# Patient Record
Sex: Male | Born: 1940 | Marital: Married | Smoking: Never smoker
Health system: Southern US, Community
[De-identification: ages and names within clinical notes are randomized; demographics above are authoritative.]

## PROBLEM LIST (undated history)

## (undated) DIAGNOSIS — K219 Gastro-esophageal reflux disease without esophagitis: Secondary | ICD-10-CM

## (undated) DIAGNOSIS — M069 Rheumatoid arthritis, unspecified: Secondary | ICD-10-CM

## (undated) DIAGNOSIS — C801 Malignant (primary) neoplasm, unspecified: Secondary | ICD-10-CM

## (undated) DIAGNOSIS — T8859XA Other complications of anesthesia, initial encounter: Secondary | ICD-10-CM

## (undated) HISTORY — DX: Rheumatoid arthritis, unspecified: M06.9

## (undated) HISTORY — PX: TRIGGER FINGER RELEASE: SHX641

## (undated) HISTORY — PX: HEMORRHOID SURGERY: SHX153

## (undated) HISTORY — DX: Gastro-esophageal reflux disease without esophagitis: K21.9

## (undated) HISTORY — PX: EYE SURGERY: SHX253

## (undated) HISTORY — PX: LYMPH NODE BIOPSY: SHX201

---

## 2014-01-11 HISTORY — PX: MOHS SURGERY: SUR867

## 2014-12-06 DIAGNOSIS — M79661 Pain in right lower leg: Secondary | ICD-10-CM | POA: Insufficient documentation

## 2015-04-20 DIAGNOSIS — H524 Presbyopia: Secondary | ICD-10-CM | POA: Insufficient documentation

## 2015-04-20 DIAGNOSIS — Z961 Presence of intraocular lens: Secondary | ICD-10-CM | POA: Insufficient documentation

## 2018-11-15 ENCOUNTER — Encounter: Payer: Self-pay | Admitting: Family Medicine

## 2018-11-15 ENCOUNTER — Ambulatory Visit: Payer: Self-pay

## 2018-11-15 ENCOUNTER — Telehealth: Payer: Self-pay

## 2018-11-15 ENCOUNTER — Ambulatory Visit (INDEPENDENT_AMBULATORY_CARE_PROVIDER_SITE_OTHER): Payer: Medicare Other | Admitting: Family Medicine

## 2018-11-15 ENCOUNTER — Other Ambulatory Visit: Payer: Self-pay

## 2018-11-15 DIAGNOSIS — M25562 Pain in left knee: Secondary | ICD-10-CM | POA: Diagnosis not present

## 2018-11-15 MED ORDER — NABUMETONE 750 MG PO TABS: 750.0000 mg | ORAL_TABLET | Freq: Two times a day (BID) | ORAL | 6 refills | Status: DC | PRN

## 2018-11-15 NOTE — Patient Instructions (Signed)
     Turmeric:  500 mg twice daily   

## 2018-11-15 NOTE — Progress Notes (Signed)
   Office Visit Note   Patient: Joshua Ruiz           Date of Birth: December 30, 1940           MRN: [id number] Visit Date: 11/15/2018 Requested by: No referring provider defined for this encounter. PCP: No primary care provider on file.  Subjective: Chief Complaint  Patient presents with  . Left Knee - Pain    Pain in medial aspect of knee x 6 months. Occasionally hurts all over, Swells. No popping/grinding. Had knee aspiration and cortisone injection 4 & 1/2 months ago by rheumatologist.    HPI: He is here with pain.  Symptoms started about 6 months ago, no injury.  He had pain and swelling and went to the rheumatologist out of town, his knee was aspirated and injected with cortisone.  The fluid did not show any abnormalities per patient report.  He got about 2 weeks of relief.  He does not have any locking or catching symptoms, it hurts only after walking for a while.  Pain does not keep him awake at night.  He is taking meloxicam with minimal improvement.  He also takes glucosamine.  Pain is primarily on the medial aspect.              ROS: No fevers or chills.  All other systems were reviewed and are negative.  Objective: Vital Signs: There were no vitals taken for this visit.  Physical Exam:  General:  Alert and oriented, in no acute distress. Pulm:  Breathing unlabored. Psy:  Normal mood, congruent affect. Skin: No rash or erythema, slight increased warmth. Left knee: Trace effusion, 1+ patellofemoral crepitus.  Full active extension and flexion of 120 degrees.  He has slight laxity with valgus stress but still a solid endpoint.  No laxity with anterior drawer.  He is tender on the medial joint line with no palpable click on McMurray's.   Imaging: X-rays left knee: Moderate medial compartment narrowing and moderate patellofemoral spurring, no obvious loose body.  Assessment & Plan: 1.  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 -Consider consider medial compartment unloading brace if symptoms persist.     Procedures: No procedures performed  No notes on file     PMFS History: There are no active problems to display for this patient.  History reviewed. No pertinent past medical history.  History reviewed. No pertinent family history.  History reviewed. No pertinent surgical history. Social History   Occupational History  . Not on file  Tobacco Use  . Smoking status: Not on file  Substance and Sexual Activity  . Alcohol use: Not on file  . Drug use: Not on file  . Sexual activity: Not on file

## 2018-11-15 NOTE — Telephone Encounter (Signed)
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

## 2018-11-20 NOTE — Telephone Encounter (Signed)
Can you please call patient and tell him the below about his cost for injection, and schedule him with Dr Junius Roads for left knee Durolane?  Thanks!  Patient will owe $90 copay on date of service.  Buy and bill ok, no PA needed.

## 2018-11-20 NOTE — Telephone Encounter (Signed)
Submitted online [id number] for Durolane.

## 2018-11-29 ENCOUNTER — Telehealth: Payer: Self-pay | Admitting: Family Medicine

## 2018-11-29 NOTE — Telephone Encounter (Signed)
I called the patient and scheduled an appointment for 12/05/18 at 10:20, $90 copay.

## 2018-11-29 NOTE — Telephone Encounter (Signed)
Pt called in checking on the status for his request for a gel injection?   [phone]

## 2018-12-05 ENCOUNTER — Other Ambulatory Visit: Payer: Self-pay

## 2018-12-05 ENCOUNTER — Encounter: Payer: Self-pay | Admitting: Family Medicine

## 2018-12-05 ENCOUNTER — Ambulatory Visit (INDEPENDENT_AMBULATORY_CARE_PROVIDER_SITE_OTHER): Payer: Medicare Other | Admitting: Family Medicine

## 2018-12-05 DIAGNOSIS — M1712 Unilateral primary osteoarthritis, left knee: Secondary | ICD-10-CM

## 2018-12-05 NOTE — Progress Notes (Signed)
   Office Visit Note   Patient: Joshua Ruiz           Date of Birth: 10/19/1940           MRN: [id number] Visit Date: 12/05/2018 Requested by: No referring provider defined for this encounter. PCP: No primary care provider on file.  Subjective: Chief Complaint  Patient presents with  . Left Knee - Pain    Durolane injection    HPI: He's here for left knee Durolane injection.                ROS: No fevers/chills.  All other systems were reviewed and are negative.  Objective: Vital Signs: There were no vitals taken for this visit.  Physical Exam:  General:  Alert and oriented, in no acute distress. Pulm:  Breathing unlabored. Psy:  Normal mood, congruent affect. Skin:  No erythema  Left knee:  1-2+ effusion with no warmth.  Imaging: None  Assessment & Plan: 1.  Left knee DJD - Durolane today.  Return as needed.     Procedures: Left knee Durolane injection: After sterile prep with Betadine, injected 3 cc 1% lidocaine without epinephrine, then aspirated 20 cc clear yellow synovial fluid, then injected duralane from superolateral approach.    PMFS History: Patient Active Problem List   Diagnosis Date Noted  . Presbyopia of both eyes 04/20/2015  . 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 Right calf pain 12/06/2014   History reviewed. No pertinent past medical history.  History reviewed. No pertinent family history.  History reviewed. No pertinent surgical history. Social History   Occupational History  . Not on file  Tobacco Use  . Smoking status: Not on file  Substance and Sexual Activity  . Alcohol use: Not on file  . Drug use: Not on file  . Sexual activity: Not on file

## 2019-01-12 HISTORY — PX: COLONOSCOPY: SHX174

## 2019-02-25 ENCOUNTER — Ambulatory Visit: Payer: Medicare PPO | Attending: Internal Medicine

## 2019-02-25 DIAGNOSIS — Z23 Encounter for immunization: Secondary | ICD-10-CM | POA: Insufficient documentation

## 2019-02-25 NOTE — Progress Notes (Signed)
   Covid-19 Vaccination Clinic  Name:  Joshua Ruiz    MRN: [id number] DOB: 1940/12/08  02/25/2019  [name] was observed post Covid-19 immunization for 15 minutes without incidence. He was provided with Vaccine Information Sheet and instruction to access the V-Safe system.   [name] was instructed to call 911 with any severe reactions post vaccine: Marland Kitchen Difficulty breathing  . Swelling of your face and throat  . A fast heartbeat  . A bad rash all over your body  . Dizziness and weakness    Immunizations Administered    Name Date Dose VIS Date Route   Pfizer COVID-19 Vaccine 02/25/2019  2:23 PM 0.3 mL 12/22/2018 Intramuscular   Manufacturer: [employer], [employer]   Lot: [id number]   NDC: [address]-0

## 2019-03-20 ENCOUNTER — Ambulatory Visit: Payer: Medicare PPO | Attending: Internal Medicine

## 2019-03-20 DIAGNOSIS — Z23 Encounter for immunization: Secondary | ICD-10-CM

## 2019-03-20 NOTE — Progress Notes (Signed)
   Covid-19 Vaccination Clinic  Name:  Joshua Ruiz    MRN: [id number] DOB: [birth year]  03/20/2019  [name] was observed post Covid-19 immunization for 15 minutes without incident. He was provided with Vaccine Information Sheet and instruction to access the V-Safe system.   [name] was instructed to call 911 with any severe reactions post vaccine: Marland Kitchen Difficulty breathing  . Swelling of face and throat  . A fast heartbeat  . A bad rash all over body  . Dizziness and weakness   Immunizations Administered    Name Date Dose VIS Date Route   Pfizer COVID-19 Vaccine 03/20/2019  2:01 PM 0.3 mL 12/22/2018 Intramuscular   Manufacturer: [employer], [employer]   Lot: [id number]   NDC: [address]-9

## 2019-03-21 ENCOUNTER — Ambulatory Visit: Payer: Medicare PPO

## 2019-05-23 ENCOUNTER — Telehealth: Payer: Self-pay | Admitting: Family Medicine

## 2019-05-23 MED ORDER — NABUMETONE 750 MG PO TABS: 750.0000 mg | ORAL_TABLET | Freq: Two times a day (BID) | ORAL | 6 refills | Status: DC | PRN

## 2019-05-23 NOTE — Telephone Encounter (Signed)
Sent!

## 2019-05-23 NOTE — Telephone Encounter (Signed)
I called and advised the patient the nabumetone has been sent in to his pharmacy.

## 2019-05-23 NOTE — Telephone Encounter (Signed)
Patient called requesting a refill of medication mabumetrone. Please send to pharmacy on file. Patient phone number is [phone].

## 2019-05-23 NOTE — Telephone Encounter (Signed)
Please advise 

## 2019-06-25 ENCOUNTER — Ambulatory Visit: Payer: Medicare PPO | Admitting: Podiatry

## 2019-06-25 ENCOUNTER — Encounter: Payer: Self-pay | Admitting: Podiatry

## 2019-06-25 ENCOUNTER — Other Ambulatory Visit: Payer: Self-pay

## 2019-06-25 DIAGNOSIS — B351 Tinea unguium: Secondary | ICD-10-CM

## 2019-06-25 DIAGNOSIS — L603 Nail dystrophy: Secondary | ICD-10-CM

## 2019-06-25 NOTE — Progress Notes (Signed)
Subjective:   Patient ID: Joshua Ruiz, male   DOB: 79 y.o.   MRN: [id number]   HPI 79 year old male presents the office for concerns of his left big toe nail.  60% infection was treated himself and the infection resolved and the nail did come off.  He wants to have the nail checked to make sure that looks okay to make sure the nails can grow back in.  Continue surgical shoe boot but he currently denies any redness or drainage or any swelling and no pain.   Review of Systems  All other systems reviewed and are negative.  History reviewed. No pertinent past medical history.  History reviewed. No pertinent surgical history.   Current Outpatient Medications:    cetirizine (ZYRTEC) 10 MG tablet, Take by mouth., Disp: , Rfl:    folic acid (FOLVITE) 1 MG tablet, Take by mouth., Disp: , Rfl:    Glucosamine-Chondroitin 500-400 MG CAPS, Take by mouth., Disp: , Rfl:    hydroxychloroquine (PLAQUENIL) 200 MG tablet, Take by mouth., Disp: , Rfl:    loratadine (CLARITIN) 10 MG tablet, Take by mouth., Disp: , Rfl:    methotrexate (RHEUMATREX) 2.5 MG tablet, Take 2.5 mg by mouth once a week. Caution:Chemotherapy. Protect from light., Disp: , Rfl:    Multiple Vitamin (MULTI-VITAMIN) tablet, Take by mouth., Disp: , Rfl:    nabumetone (RELAFEN) 750 MG tablet, Take 1 tablet (750 mg total) by mouth 2 (two) times daily as needed., Disp: 60 tablet, Rfl: 6   omeprazole (PRILOSEC) 20 MG capsule, Take by mouth., Disp: , Rfl:   No Known Allergies        Objective:  Physical Exam  General: AAO x3, NAD  Dermatological: Bilateral hallux nails are hypertrophic, dystrophic with yellow-brown discoloration except on the left side only half the nails present Second new nail started to grow back again.  There is no drainage or pus or ascending cellulitis.  There is no fluctuation.  There is no tenderness palpation of nail.  No obvious signs of infection.  Vascular: Dorsalis Pedis artery and  Posterior Tibial artery pedal pulses are 2/4 bilateral with immedate capillary fill time.There is no pain with calf compression, swelling, warmth, erythema.   Neruologic: Grossly intact via light touch bilateral.  Musculoskeletal: No gross boney pedal deformities bilateral. No pain, crepitus, or limitation noted with foot and ankle range of motion bilateral. Muscular strength 5/5 in all groups tested bilateral.  Gait: Unassisted, Nonantalgic.       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  Discussed this likely the nail will grow out but it is difficult to determine if it will do so completely. Unfortunately the nails are very damage to the bilateral hallux nails.  Discussed treatment options for this.  For now would recommend Epson salt soaks daily metabolic ointment monitor closely for signs or symptoms of recurrent infection.    Vivi Barrack DPM

## 2019-06-25 NOTE — Patient Instructions (Signed)
You can apply antifungal medication to the toenail. I like an over the counter medication called "fungi-nail".  If you develop any signs of infection, please let me know .

## 2019-12-14 ENCOUNTER — Other Ambulatory Visit: Payer: Self-pay | Admitting: Family Medicine

## 2020-06-20 ENCOUNTER — Ambulatory Visit: Payer: Medicare PPO | Admitting: Family Medicine

## 2020-07-01 ENCOUNTER — Ambulatory Visit: Payer: Medicare PPO | Attending: Rheumatology

## 2020-07-01 ENCOUNTER — Other Ambulatory Visit: Payer: Self-pay

## 2020-07-01 DIAGNOSIS — R293 Abnormal posture: Secondary | ICD-10-CM | POA: Insufficient documentation

## 2020-07-01 DIAGNOSIS — M542 Cervicalgia: Secondary | ICD-10-CM | POA: Diagnosis present

## 2020-07-01 NOTE — Therapy (Signed)
[facility] [address] [place], [place], [address] Phone: [phone]   Fax:  [phone]  Physical Therapy Evaluation  Patient Details  Name: Joshua Ruiz MRN: [id number] Date of Birth: 11/21/1940 Referring Provider (PT): Areatha Keas, MD   Encounter Date: 07/01/2020   PT End of Session - 07/01/20 1637     Visit Number 1    Number of Visits 12    Date for PT Re-Evaluation 08/26/20    Authorization Type [employer]    Progress Note Due on Visit 10    PT Start Time 1533    PT Stop Time 1620    PT Time Calculation (min) 47 min    Activity Tolerance Patient tolerated treatment well    Behavior During Therapy [facility] for tasks assessed/performed             No past medical history on file.  No past surgical history on file.  There were no vitals filed for this visit.    Subjective Assessment - 07/01/20 1540     Subjective Pt reports neck pain for the last 1-1.5 years that has gotten worse. 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 He began taking Methotrexate 5-6 years ago. He has limitations with driving due to difficulty turning head R>L and with prolonged looking down, as well as AM pain and pain upon lying in bed at night.    Patient Stated Goals "to get neck loosened up so it doesn't hurt much, and I can turn it better"    Currently in Pain? No/denies    Pain Score --   5-6/10   Pain Location Neck    Pain Orientation Right;Left    Pain Descriptors / Indicators Aching    Pain Type Chronic pain    Pain Onset More than a month ago    Pain Frequency Intermittent    Aggravating Factors  turning head when driving (has to turn body) R>L, nighttime pain when he lays down, looking down to look at phone for awhile, AM stiffness    Pain Relieving Factors laying down and turning head side to side in the morning                [facility] PT Assessment - 07/01/20 0001       Assessment    Medical Diagnosis Cervicalgia    Referring Provider (PT) Areatha Keas, MD    Onset Date/Surgical Date --   at least a year   Hand Dominance Right    Next MD Visit 3 months    Prior Therapy several years ago for R shoulder      Precautions   Precautions None      Restrictions   Weight Bearing Restrictions No      Balance Screen   Has the patient fallen in the past 6 months No    Has the patient had a decrease in activity level because of a fear of falling?  No    Is the patient reluctant to leave their home because of a fear of falling?  No      Posture/Postural Control   Posture/Postural Control Postural limitations    Postural Limitations Forward head;Rounded Shoulders    Posture Comments increase upper thoracic kyphosis      ROM / Strength   AROM / PROM / Strength AROM;Strength      AROM   AROM Assessment Site Cervical;Shoulder    Right/Left Shoulder Right;Left  Right Shoulder Flexion 120 Degrees    Right Shoulder ABduction 140 Degrees    Right Shoulder Internal Rotation --   L3   Right Shoulder External Rotation --   T2   Left Shoulder Flexion 138 Degrees    Left Shoulder ABduction 140 Degrees    Left Shoulder Internal Rotation --   T12   Left Shoulder External Rotation --   T2   Cervical Flexion 28    Cervical Extension 22    Cervical - Right Side Bend 8    Cervical - Left Side Bend 10    Cervical - Right Rotation 42    Cervical - Left Rotation 48      Strength   Strength Assessment Site Shoulder    Right/Left Shoulder Right;Left    Right Shoulder Flexion 5/5    Right Shoulder ABduction 5/5    Right Shoulder External Rotation 5/5    Right Shoulder Horizontal ADduction 5/5    Left Shoulder Flexion 5/5    Left Shoulder ABduction 5/5    Left Shoulder Internal Rotation 5/5    Left Shoulder External Rotation 5/5                        Objective measurements completed on examination: See above findings.               PT Education  - 07/01/20 1636     Education Details Diagnosis, Prognosis, HEP, POC    Person(s) Educated Patient    Methods Explanation;Demonstration;Tactile cues;Verbal cues;Handout    Comprehension Verbalized understanding;Returned demonstration;Verbal cues required;Tactile cues required              PT Short Term Goals - 07/01/20 1656       PT SHORT TERM GOAL #1   Title Pt will be I and compliant with initial HEP.    Baseline prescribed at eval    Time 3    Period Weeks    Status New    Target Date 07/22/20      PT SHORT TERM GOAL #2   Title FOTO will be taken with LTG created by visit 3.    Baseline not taken    Time 3    Period Weeks    Status New    Target Date 07/22/20               PT Long Term Goals - 07/01/20 1656       PT LONG TERM GOAL #1   Title Pt will be I with advanced HEP for maintenance of symptoms.    Time 8    Period Weeks    Status New    Target Date 08/26/20      PT LONG TERM GOAL #2   Title Pt will increase B cervical rotation to >/= 60, in order to easily turn head to check blindspots for safety in driving.    Baseline see flowsheet    Time 8    Period Weeks    Status New    Target Date 08/26/20      PT LONG TERM GOAL #3   Title Pt will decrease max pain level to </= 3/10 with all I/ADLs.    Baseline 5-6/10    Time 8    Period Weeks    Status New    Target Date 08/26/20                    Plan -  07/01/20 1638     Clinical Impression Statement Pt is an 80 yo male who presents to OP PT with neck pain that began 1-1.5 years ago, likely Rheumatoid arthritis progression. He c/o difficulty turning his head causing him to turn his body to check blindspots, pain with prolonged looking down (i.e. at phone), AM stiffness for 1st hour, and pain with lying down at nighttime. He demonstrates impairments in posture: forward head, increased upper thoracic kyphosis, forward rounding of shoulders; decreased neck ROM all planes; hypmobility of  cervical joints; and decreased B shoulder ROM R>L. Pt was educated on posture, diagnosis, prognosis, HEP, and POC verbalizing understanding and consent to tx. He could benefit from skilled PT 1-2x/week for 8 weeks to address neck pain and stiffness for improved ability to turn his head to safely drive and participate in IADLs.    Personal Factors and Comorbidities Comorbidity 1;Age;Past/Current Experience;Time since onset of injury/illness/exacerbation    Comorbidities RA    Examination-Activity Limitations Reach Overhead;Lift;Sleep    Examination-Participation Restrictions Driving    Rehab Potential Good    PT Frequency --   1-2x/week   PT Duration 8 weeks    PT Treatment/Interventions Spinal Manipulations;Joint Manipulations;ADLs/Self Care Home Management;Cryotherapy;Electrical Stimulation;Iontophoresis 4mg /ml Dexamethasone;Moist Heat;Traction;Therapeutic activities;Therapeutic exercise;Neuromuscular re-education;Patient/family education;Manual techniques;Passive range of motion;Dry needling;Taping    PT Next Visit Plan FOTO!! Assess response to HEP/update PRN, progress cervical/spinal mobility, pec flexibility, DNF and periscapular strengthening    PT Home Exercise Plan [email or web address]    Consulted and Agree with Plan of Care Patient             Patient will benefit from skilled therapeutic intervention in order to improve the following deficits and impairments:  Pain, Postural dysfunction, Impaired flexibility, Increased fascial restricitons, Improper body mechanics, Impaired perceived functional ability, Decreased range of motion, Hypomobility  Visit Diagnosis: Cervicalgia  Abnormal posture     Problem List Patient Active Problem List   Diagnosis Date Noted   Presbyopia of both eyes 04/20/2015   Pseudophakia of both eyes 04/20/2015   Right calf pain 12/06/2014    Marcelline Mates, PT, DPT 07/01/2020, 5:01 PM  [facility] [address] [place], [place], [address] Phone: [phone]   Fax:  [phone]  Name: Ananth Maisonet MRN: [id number] Date of Birth: [birth year]

## 2020-07-01 NOTE — Patient Instructions (Signed)
Access Code: [email or web address] URL: [email or web address] Date: 07/01/2020 Prepared by: Gardiner Rhyme  Exercises Open Books - 2 x daily - 7 x weekly - 2 sets - 10 reps Thoracic Extension Mobilization on Half Foam Roll - 2 x daily - 7 x weekly - 2 sets - 10 reps Hooklying Cervical Rotations on Half Foam Roll - 1-2 x daily - 7 x weekly - 1-2 sets - 10 reps Seated Assisted Cervical Rotation with Towel - 2-3 x daily - 7 x weekly - 1-2 sets - 10 reps Seated Cervical Traction - 2-3 x daily - 7 x weekly - 5-10 sets - 5-10 seconds hold

## 2020-07-03 ENCOUNTER — Other Ambulatory Visit: Payer: Self-pay

## 2020-07-03 ENCOUNTER — Ambulatory Visit: Payer: Medicare PPO

## 2020-07-03 DIAGNOSIS — M542 Cervicalgia: Secondary | ICD-10-CM

## 2020-07-03 DIAGNOSIS — R293 Abnormal posture: Secondary | ICD-10-CM

## 2020-07-03 NOTE — Therapy (Signed)
[facility] [address]. [place], [place], [address] Phone: [phone]   Fax:  [phone]  Physical Therapy Treatment  Patient Details  Name: Joshua Ruiz MRN: [id number] Date of Birth: 03/31/1940 Referring Provider (PT): Areatha Keas, MD   Encounter Date: 07/03/2020   PT End of Session - 07/03/20 1053     Visit Number 2    Number of Visits 12    Date for PT Re-Evaluation 08/26/20    Authorization Type [employer]    Progress Note Due on Visit 10    PT Start Time 1041    PT Stop Time 1125    PT Time Calculation (min) 44 min    Activity Tolerance Patient tolerated treatment well    Behavior During Therapy [facility] for tasks assessed/performed             History reviewed. No pertinent past medical history.  History reviewed. No pertinent surgical history.  There were no vitals filed for this visit.   Subjective Assessment - 07/03/20 1052     Subjective Doing okay, tried some of the exercises, feel like they help sme    Patient Stated Goals "to get neck loosened up so it doesn't hurt much, and I can turn it better"    Currently in Pain? Yes    Pain Score 1     Pain Location Neck    Pain Orientation Right;Left    Pain Onset More than a month ago                [facility] PT Assessment - 07/03/20 0001       Observation/Other Assessments   Focus on Therapeutic Outcomes (FOTO)  45%                           [facility] Adult PT Treatment/Exercise - 07/03/20 0001       Exercises   Exercises Neck;Shoulder      Neck Exercises: Seated   Other Seated Exercise 5" x 10 cervical traction with towel. 5" x 10 cervical rotaiton with towel bilateral.      Neck Exercises: Supine   Other Supine Exercise hooklying cervical rotation on vertical foam roll with arms out to sides x 10 L/R. thoracic mobilization on foam roll x10 hookyling. Open book stretch 5" x 10 bilateral.    Other Supine Exercise chin  tucks 5" x 10. chin tuck with head lift 5" x5. Yellow TB horizontal ABD in supine x 10      Shoulder Exercises: Seated   Extension Strengthening;Both;10 reps   2 sets, red   Row Strengthening;Both;10 reps   2 sets, red                     PT Short Term Goals - 07/03/20 1056       PT SHORT TERM GOAL #1   Title Pt will be I and compliant with initial HEP.    Baseline prescribed at eval    Time 3    Period Weeks    Status On-going    Target Date 07/22/20      PT SHORT TERM GOAL #2   Title FOTO will be taken with LTG created by visit 3.    Baseline 45% taken visit 2    Time 3    Period Weeks    Status Achieved    Target Date 07/22/20  PT Long Term Goals - 07/03/20 1057       PT LONG TERM GOAL #1   Title Pt will be I with advanced HEP for maintenance of symptoms.    Time 8    Period Weeks    Status On-going      PT LONG TERM GOAL #2   Title Pt will increase B cervical rotation to >/= 60, in order to easily turn head to check blindspots for safety in driving.    Baseline see flowsheet    Time 8    Period Weeks    Status On-going      PT LONG TERM GOAL #3   Title Pt will decrease max pain level to </= 3/10 with all I/ADLs.    Baseline 5-6/10    Time 8    Period Weeks    Status On-going      PT LONG TERM GOAL #4   Title FOTO increased to at least 58% to dmeo improved neck function    Status On-going                   Plan - 07/03/20 1054     Clinical Impression Statement Pt tolerated all interventions well. Reviewed initial HEP with some cues required to prevent trunk compensations with seated cervical stretches and cues for hand positioning for towel stretches. Completed STG of attaining FOTO this visit where he scored at 45% for neck function at this time.  Added banded rows, extensions to HEP for postural strengthening. Plan to continue per POC and progress mobility, strength as tolerated.    Personal Factors and  Comorbidities Comorbidity 1;Age;Past/Current Experience;Time since onset of injury/illness/exacerbation    Comorbidities RA    Examination-Activity Limitations Reach Overhead;Lift;Sleep    Examination-Participation Restrictions Driving    Rehab Potential Good    PT Frequency --   1-2x/week   PT Duration 8 weeks    PT Treatment/Interventions Spinal Manipulations;Joint Manipulations;ADLs/Self Care Home Management;Cryotherapy;Electrical Stimulation;Iontophoresis 4mg /ml Dexamethasone;Moist Heat;Traction;Therapeutic activities;Therapeutic exercise;Neuromuscular re-education;Patient/family education;Manual techniques;Passive range of motion;Dry needling;Taping    PT Next Visit Plan Assess response to HEP/update PRN, progress cervical/spinal mobility, pec flexibility, DNF and periscapular strengthening    PT Home Exercise Plan [id number] - reinforced compliance with HEP for home carryover    Consulted and Agree with Plan of Care Patient             Patient will benefit from skilled therapeutic intervention in order to improve the following deficits and impairments:  Pain, Postural dysfunction, Impaired flexibility, Increased fascial restricitons, Improper body mechanics, Impaired perceived functional ability, Decreased range of motion, Hypomobility  Visit Diagnosis: Cervicalgia  Abnormal posture     Problem List Patient Active Problem List   Diagnosis Date Noted   Presbyopia of both eyes 04/20/2015   Pseudophakia of both eyes 04/20/2015   Right calf pain 12/06/2014    12/08/2014, PT, DPT 07/03/2020, 11:28 AM  [facility] [place] [address] [place], [place], [place] Phone: [phone]   Fax:  [phone]  Name: Joshua Ruiz MRN: Gwynne Edinger Date of Birth: May 08, 1940

## 2020-07-08 ENCOUNTER — Encounter: Payer: Self-pay | Admitting: Physical Therapy

## 2020-07-08 ENCOUNTER — Ambulatory Visit: Payer: Medicare PPO | Admitting: Physical Therapy

## 2020-07-08 ENCOUNTER — Other Ambulatory Visit: Payer: Self-pay

## 2020-07-08 DIAGNOSIS — R293 Abnormal posture: Secondary | ICD-10-CM

## 2020-07-08 DIAGNOSIS — M542 Cervicalgia: Secondary | ICD-10-CM

## 2020-07-08 NOTE — Therapy (Signed)
[facility] [place] [facility] [address] [place], [place], [address] Phone: [phone]   Fax:  [phone]  Physical Therapy Treatment  Patient Details  Name: Joshua Ruiz MRN: [id number] Date of Birth: [birth year] Referring Provider (PT): Areatha Keas, MD   Encounter Date: 07/08/2020   PT End of Session - 07/08/20 0916     Visit Number 3    Number of Visits 12    Date for PT Re-Evaluation 08/26/20    Authorization Type [employer]    Progress Note Due on Visit 10    PT Start Time 0847    PT Stop Time 0925    PT Time Calculation (min) 38 min    Activity Tolerance Patient tolerated treatment well    Behavior During Therapy [facility] for tasks assessed/performed             History reviewed. No pertinent past medical history.  History reviewed. No pertinent surgical history.  There were no vitals filed for this visit.   Subjective Assessment - 07/08/20 0841     Subjective Pt is doing okay today. Pt reports he has been doing those exercises at home.    Currently in Pain? Yes    Pain Score 1     Pain Location Neck                               [facility] Adult PT Treatment/Exercise - 07/08/20 0001       Neck Exercises: Machines for Strengthening   UBE (Upper Arm Bike) L1 2.5 fwd/2.5 bwd      Neck Exercises: Standing   Other Standing Exercises shrugs 2x10 3#      Neck Exercises: Seated   Other Seated Exercise 2x30 sec upper trap, levator stretch    Other Seated Exercise chin tucks 2x10, cervicle rotation with towel 2x30 sec      Shoulder Exercises: Seated   Extension Strengthening;Both;10 reps   2 sets, red   Row Strengthening;Both;10 reps   2 sets, red     Shoulder Exercises: Standing   Horizontal ABduction Both;20 reps;Theraband    Theraband Level (Shoulder Horizontal ABduction) Level 1 (Yellow)    Flexion Both;20 reps;Weights   2#   ABduction Both;20 reps;Weights   2#     Shoulder Exercises:  Stretch   Corner Stretch 2 reps;30 seconds                      PT Short Term Goals - 07/03/20 1056       PT SHORT TERM GOAL #1   Title Pt will be I and compliant with initial HEP.    Baseline prescribed at eval    Time 3    Period Weeks    Status On-going    Target Date 07/22/20      PT SHORT TERM GOAL #2   Title FOTO will be taken with LTG created by visit 3.    Baseline 45% taken visit 2    Time 3    Period Weeks    Status Achieved    Target Date 07/22/20               PT Long Term Goals - 07/03/20 1057       PT LONG TERM GOAL #1   Title Pt will be I with advanced HEP for maintenance of symptoms.    Time 8    Period Weeks  Status On-going      PT LONG TERM GOAL #2   Title Pt will increase B cervical rotation to >/= 60, in order to easily turn head to check blindspots for safety in driving.    Baseline see flowsheet    Time 8    Period Weeks    Status On-going      PT LONG TERM GOAL #3   Title Pt will decrease max pain level to </= 3/10 with all I/ADLs.    Baseline 5-6/10    Time 8    Period Weeks    Status On-going      PT LONG TERM GOAL #4   Title FOTO increased to at least 58% to dmeo improved neck function    Status On-going                   Plan - 07/08/20 9826     Clinical Impression Statement Pt tolerated all exercises well. He required verbal and tactile cueing for posture throughtout all exercises for decrease kyphotic posture and forward head. Pt reports a decrease in pain following treatment.    PT Treatment/Interventions Spinal Manipulations;Joint Manipulations;ADLs/Self Care Home Management;Cryotherapy;Electrical Stimulation;Iontophoresis 4mg /ml Dexamethasone;Moist Heat;Traction;Therapeutic activities;Therapeutic exercise;Neuromuscular re-education;Patient/family education;Manual techniques;Passive range of motion;Dry needling;Taping    PT Next Visit Plan Assess response to HEP/update PRN, progress cervical/spinal  mobility, pec flexibility, DNF and periscapular strengthening    PT Home Exercise Plan [id number] - reinforced compliance with HEP for home carryover             Patient will benefit from skilled therapeutic intervention in order to improve the following deficits and impairments:  Pain, Postural dysfunction, Impaired flexibility, Increased fascial restricitons, Improper body mechanics, Impaired perceived functional ability, Decreased range of motion, Hypomobility  Visit Diagnosis: Cervicalgia  Abnormal posture     Problem List Patient Active Problem List   Diagnosis Date Noted   Presbyopia of both eyes 04/20/2015   Pseudophakia of both eyes 04/20/2015   Right calf pain 12/06/2014    12/08/2014, SPTA 07/08/2020, 9:27 AM  [facility] [place] [facility] [address] [place], [place], [place] Phone: [phone]   Fax:  [phone]  Name: Joshua Ruiz MRN: Gwynne Edinger Date of Birth: 1940/03/06

## 2020-07-10 ENCOUNTER — Other Ambulatory Visit: Payer: Self-pay

## 2020-07-10 ENCOUNTER — Ambulatory Visit: Payer: Medicare PPO | Admitting: Physical Therapy

## 2020-07-10 DIAGNOSIS — M542 Cervicalgia: Secondary | ICD-10-CM | POA: Diagnosis not present

## 2020-07-10 DIAGNOSIS — R293 Abnormal posture: Secondary | ICD-10-CM

## 2020-07-10 NOTE — Therapy (Signed)
[facility] [place] [facility] [address] [place], [place], [address] Phone: [phone]   Fax:  [phone]  Physical Therapy Treatment  Patient Details  Name: Joshua Ruiz MRN: [id number] Date of Birth: 10-27-1940 Referring Provider (PT): Areatha Keas, MD   Encounter Date: 07/10/2020   PT End of Session - 07/10/20 1441     Visit Number 4    Number of Visits 12    Date for PT Re-Evaluation 08/26/20    Authorization Type [employer] Medicare    Progress Note Due on Visit 10    PT Start Time 1355    PT Stop Time 1441    PT Time Calculation (min) 46 min    Activity Tolerance Patient tolerated treatment well    Behavior During Therapy [facility] for tasks assessed/performed             No past medical history on file.  No past surgical history on file.  There were no vitals filed for this visit.   Subjective Assessment - 07/10/20 1425     Subjective Neck is just real stiff    Currently in Pain? No/denies                               [facility] Adult PT Treatment/Exercise - 07/10/20 0001       Neck Exercises: Theraband   Shoulder External Rotation 20 reps;Red      Neck Exercises: Standing   Neck Retraction 15 reps    Other Standing Exercises weighted ball overhead reach with some PT over pressure, W backs with some PT over pressure, shrugs 5# with some upper trap and levator stretches      Neck Exercises: Supine   Other Supine Exercise chin tucks 5" x 10. chin tuck with head lift 5" x5. Yellow TB horizontal ABD in supine x 10      Manual Therapy   Manual Therapy Passive ROM;Manual Traction;Soft tissue mobilization    Soft tissue mobilization to cervical parapsinals    Passive ROM to the cervical spine in supine and sitting, some AAROM to end range    Manual Traction occipital release                      PT Short Term Goals - 07/03/20 1056       PT SHORT TERM GOAL #1   Title Pt will be I and  compliant with initial HEP.    Baseline prescribed at eval    Time 3    Period Weeks    Status On-going    Target Date 07/22/20      PT SHORT TERM GOAL #2   Title FOTO will be taken with LTG created by visit 3.    Baseline 45% taken visit 2    Time 3    Period Weeks    Status Achieved    Target Date 07/22/20               PT Long Term Goals - 07/10/20 1442       PT LONG TERM GOAL #1   Title Pt will be I with advanced HEP for maintenance of symptoms.    Status On-going                   Plan - 07/10/20 1441     Clinical Impression Statement I focused a lot on cervical ROM trying to get  rotation and some side bending, he is very very stiff, he does guard some and needs cue s to relax, needs a lot of cues for the exercises to do correct form    PT Next Visit Plan see how the manual therapy did    Consulted and Agree with Plan of Care Patient             Patient will benefit from skilled therapeutic intervention in order to improve the following deficits and impairments:  Pain, Postural dysfunction, Impaired flexibility, Increased fascial restricitons, Improper body mechanics, Impaired perceived functional ability, Decreased range of motion, Hypomobility  Visit Diagnosis: Cervicalgia  Abnormal posture     Problem List Patient Active Problem List   Diagnosis Date Noted   Presbyopia of both eyes 04/20/2015   Pseudophakia of both eyes 04/20/2015   Right calf pain 12/06/2014    Jearld Lesch., PT 07/10/2020, 2:43 PM  [facility] [place] [facility] [address] [place], [place], [address] Phone: [phone]   Fax:  [phone]  Name: Joshua Ruiz MRN: [id number] Date of Birth: 01-10-1941

## 2020-07-11 ENCOUNTER — Encounter: Payer: Self-pay | Admitting: Family Medicine

## 2020-07-11 ENCOUNTER — Ambulatory Visit: Payer: Medicare PPO | Admitting: Family Medicine

## 2020-07-11 ENCOUNTER — Telehealth: Payer: Self-pay

## 2020-07-11 VITALS — BP 142/78 | HR 69 | Temp 98.3°F | Ht 68.0 in | Wt 194.4 lb

## 2020-07-11 DIAGNOSIS — K219 Gastro-esophageal reflux disease without esophagitis: Secondary | ICD-10-CM | POA: Diagnosis not present

## 2020-07-11 DIAGNOSIS — L509 Urticaria, unspecified: Secondary | ICD-10-CM | POA: Diagnosis not present

## 2020-07-11 DIAGNOSIS — M069 Rheumatoid arthritis, unspecified: Secondary | ICD-10-CM | POA: Diagnosis not present

## 2020-07-11 DIAGNOSIS — N50811 Right testicular pain: Secondary | ICD-10-CM

## 2020-07-11 DIAGNOSIS — M47812 Spondylosis without myelopathy or radiculopathy, cervical region: Secondary | ICD-10-CM | POA: Insufficient documentation

## 2020-07-11 DIAGNOSIS — M1712 Unilateral primary osteoarthritis, left knee: Secondary | ICD-10-CM | POA: Insufficient documentation

## 2020-07-11 MED ORDER — LEVOFLOXACIN 500 MG PO TABS: 500.0000 mg | ORAL_TABLET | Freq: Every day | ORAL | 0 refills | Status: AC

## 2020-07-11 NOTE — Progress Notes (Signed)
[facility] [address] [place] [address] Dept: [phone] Dept Fax: [phone]  New Patient Office Visit  Subjective:    Patient ID: Joshua Ruiz, male    DOB: 05/20/1940, 80 y.o..   MRN: [id number]  Chief Complaint  Patient presents with   Establish Care    Right testicle discomfort    History of Present Illness:  Patient is in today to establish care. Mr. Reihl is originally from [place], [place]. He has lived in [place] for much of his life. He is retired, having worked for the [employer]. 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 He is married and has 1 son (85). Mr. Angert denies tobacco or drug use. He admits to rare use of alcohol.  Mr. Broner has a history of rheumatoid arthritis. He is followed by Dr. Baldwin Jamaica with the [facility]. He is treated with methotrexate 14.5 mg weekly and hydroxychloroquine 200 mg daily. He also take folate 1 mg daily. He has not seen an eye doctor since last Sept. and has an appointment scheduled. He notes that his joints are doing okay at this point. He has had some neck stiffness and notes Dr. 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 He has been engaged in physical therapy for this. Mr. 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 He sees Dr. Prince Rome (sports medicine) for this and has received a prior gel injection in the knee.  Mr. Kloss has a history fo GERD, well managed with Prilosec.  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 He takes daily Zyrtec and notes this mostly controlls his symptoms.  Mr. Isadore notes a history of right testicular pain over the past 2 months. He has not noted the testicle to be swollen. 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 He also notes he was treated twice in the past for an  infection related to his testicle.  Past Medical History: Patient Active Problem List   Diagnosis Date Noted   Hives 07/11/2020   Rheumatoid arthritis (HCC) 07/11/2020   Osteoarthritis of left knee 07/11/2020   Cervical arthritis 07/11/2020   Gastroesophageal reflux disease 07/11/2020   Presbyopia of both eyes 04/20/2015   Pseudophakia of both eyes 04/20/2015   Past Surgical History:  Procedure Laterality Date   HEMORRHOID SURGERY     LYMPH NODE BIOPSY     Neck   TRIGGER FINGER RELEASE Right    3rd   Family History  Problem Relation Age of Onset   Rheum arthritis Mother    Diabetes Mother    Cancer Sister        Lung   Cancer Brother        Lung   Outpatient Medications Prior to Visit  Medication Sig Dispense Refill   cetirizine (ZYRTEC) 10 MG tablet Take by mouth.     folic acid (FOLVITE) 1 MG tablet Take by mouth.     hydroxychloroquine (PLAQUENIL) 200 MG tablet Take by mouth.     loratadine (CLARITIN) 10 MG tablet Take by mouth.     methotrexate (RHEUMATREX) 2.5 MG tablet Take 2.5 mg by mouth once a week. Caution:Chemotherapy. Protect from light.     Multiple Vitamin (MULTI-VITAMIN) tablet Take by mouth.     omeprazole (PRILOSEC) 20 MG capsule Take by mouth.     Glucosamine-Chondroitin  500-400 MG CAPS Take by mouth. (Patient not taking: Reported on 07/11/2020)     nabumetone (RELAFEN) 750 MG tablet TAKE 1 TABLET (750 MG TOTAL) BY MOUTH 2 (TWO) TIMES DAILY AS NEEDED. (Patient not taking: Reported on 07/11/2020) 60 tablet 6   No facility-administered medications prior to visit.   No Known Allergies    Objective:   Today's Vitals   07/11/20 1353  BP: (!) 142/78  Pulse: 69  Temp: 98.3 F (36.8 C)  SpO2: 98%  Weight: 194 lb 6.4 oz (88.2 kg)  Height: 5\' 8"  (1.727 m)   Body mass index is 29.56 kg/m.   General: Well developed, well nourished. No acute distress. GU: Normal external genitalia. The testes are descended. Both are soft. The epididymis is more     prominent on the right. There is no obvious varicocele. No sign of hernia. Extremities: No sing of joint inflammation related to any of the major joints. Psych: Alert and oriented. Normal mood and affect.  Health Maintenance Due  Topic Date Due   TETANUS/TDAP  Never done   Zoster Vaccines- Shingrix (1 of 2) Never done   PNA vac Low Risk Adult (1 of 2 - PCV13) Never done   COVID-19 Vaccine (3 - Pfizer risk series) 04/17/2019     Assessment & Plan:   1. Hives Continue daily Zyrtec.  2. Rheumatoid arthritis, involving unspecified site, unspecified whether rheumatoid factor present (HCC) Continue MTX, Plaquenil, and folate. I did recommend he follow-up for monitoring with the eye doctor every 6 months.  3. Gastroesophageal reflux disease without esophagitis Continue Prilosec.  4. Right testicular pain I suspect he may have a low-grade epididymitis. I recommend we try a 10-day course of levofloxacin. I recommended he also do daily hot soaks to increase blood flow to the testis while taking the antibiotics. If not improving, he should follow-up with me.  - levofloxacin (LEVAQUIN) 500 MG tablet; Take 1 tablet (500 mg total) by mouth daily for 10 days.  Dispense: 10 tablet; Refill: 0  06/17/2019, MD

## 2020-07-11 NOTE — Telephone Encounter (Signed)
Received a call from pharmacist regarding drug interaction between the Levofloxacin and Hydrocloriqine for prolong QT.  Spoke to Dr Veto Kemps and was advised patient is not known for heart problems and is okay to take Levofloxacin for 10 days.   Pharmacist made aware.  Dm/cma

## 2020-07-16 ENCOUNTER — Encounter: Payer: Self-pay | Admitting: Physical Therapy

## 2020-07-16 ENCOUNTER — Ambulatory Visit: Payer: Medicare PPO | Attending: Rheumatology | Admitting: Physical Therapy

## 2020-07-16 ENCOUNTER — Other Ambulatory Visit: Payer: Self-pay

## 2020-07-16 DIAGNOSIS — M542 Cervicalgia: Secondary | ICD-10-CM | POA: Diagnosis not present

## 2020-07-16 DIAGNOSIS — R293 Abnormal posture: Secondary | ICD-10-CM

## 2020-07-16 NOTE — Therapy (Signed)
[facility] [place] [facility] [address] [place], [place], [address] Phone: [phone]   Fax:  [phone]  Physical Therapy Treatment  Patient Details  Name: Joshua Ruiz MRN: [id number] Date of Birth: Dec 20, 1940 Referring Provider (PT): Areatha Keas, MD   Encounter Date: 07/16/2020   PT End of Session - 07/16/20 0924     Visit Number 5    Number of Visits 12    Date for PT Re-Evaluation 08/26/20    PT Start Time 0840    PT Stop Time 0930    PT Time Calculation (min) 50 min    Activity Tolerance Patient tolerated treatment well    Behavior During Therapy [facility] for tasks assessed/performed             Past Medical History:  Diagnosis Date   GERD (gastroesophageal reflux disease)    Rheumatoid arthritis (HCC)     Past Surgical History:  Procedure Laterality Date   HEMORRHOID SURGERY     LYMPH NODE BIOPSY     Neck   TRIGGER FINGER RELEASE Right    3rd    There were no vitals filed for this visit.   Subjective Assessment - 07/16/20 0842     Subjective reports that he was a little stiff and sore the next morning after the last visit    Currently in Pain? No/denies                               [facility] Adult PT Treatment/Exercise - 07/16/20 0001       Neck Exercises: Machines for Strengthening   UBE (Upper Arm Bike) level 3 x 6 minutes    Cybex Row 45# 2x10    Lat Pull 45# 2x10    Other Machines for Strengthening 10# shoulder extension cues for core and posture      Neck Exercises: Theraband   Shoulder External Rotation 20 reps;Green      Neck Exercises: Standing   Other Standing Exercises weighted ball overhead reach with some PT over pressure, W backs with some PT over pressure, shrugs 5# with some upper trap and levator stretches    Other Standing Exercises door way stretches      Modalities   Modalities Traction      Traction   Type of Traction Cervical    Min (lbs) 12    Hold Time  static    Time 12      Manual Therapy   Manual Therapy Soft tissue mobilization;Passive ROM    Soft tissue mobilization to cervical parapsinals    Passive ROM to the cervical spine in supine and sitting, some AAROM to end range                      PT Short Term Goals - 07/03/20 1056       PT SHORT TERM GOAL #1   Title Pt will be I and compliant with initial HEP.    Baseline prescribed at eval    Time 3    Period Weeks    Status On-going    Target Date 07/22/20      PT SHORT TERM GOAL #2   Title FOTO will be taken with LTG created by visit 3.    Baseline 45% taken visit 2    Time 3    Period Weeks    Status Achieved    Target Date 07/22/20  PT Long Term Goals - 07/16/20 0926       PT LONG TERM GOAL #1   Title Pt will be I with advanced HEP for maintenance of symptoms.    Status On-going                   Plan - 07/16/20 0925     Clinical Impression Statement I tried the traction unit with him to day, I also did a little more STM to loosen up the neck mms and allow for better ROM, he is still very stiff, has limited thoracic extension    PT Next Visit Plan continue to adjust to what works and try to get better motion in the thoaracic spine and better cervical ROM             Patient will benefit from skilled therapeutic intervention in order to improve the following deficits and impairments:  Pain, Postural dysfunction, Impaired flexibility, Increased fascial restricitons, Improper body mechanics, Impaired perceived functional ability, Decreased range of motion, Hypomobility  Visit Diagnosis: Cervicalgia  Abnormal posture     Problem List Patient Active Problem List   Diagnosis Date Noted   Hives 07/11/2020   Rheumatoid arthritis (HCC) 07/11/2020   Osteoarthritis of left knee 07/11/2020   Cervical arthritis 07/11/2020   Gastroesophageal reflux disease 07/11/2020   Presbyopia of both eyes 04/20/2015    Pseudophakia of both eyes 04/20/2015    Jearld Lesch., PT 07/16/2020, 9:27 AM  [facility] [place] [address] [place], [place], [address] Phone: [phone]   Fax:  [phone]  Name: Joshua Ruiz MRN: [id number] Date of Birth: 09-09-1940

## 2020-07-18 ENCOUNTER — Other Ambulatory Visit: Payer: Self-pay

## 2020-07-18 ENCOUNTER — Ambulatory Visit: Payer: Medicare PPO

## 2020-07-18 DIAGNOSIS — M542 Cervicalgia: Secondary | ICD-10-CM

## 2020-07-18 DIAGNOSIS — R293 Abnormal posture: Secondary | ICD-10-CM

## 2020-07-18 NOTE — Therapy (Signed)
[facility] [address] [place], [place], [address] Phone: [phone]   Fax:  [phone]  Physical Therapy Treatment  Patient Details  Name: Joshua Ruiz MRN: [id number] Date of Birth: June 14, 1940 Referring Provider (PT): Areatha Keas, MD   Encounter Date: 07/18/2020   PT End of Session - 07/18/20 0851     Visit Number 6    Number of Visits 12    Date for PT Re-Evaluation 08/26/20    PT Start Time 0846    PT Stop Time 0930    PT Time Calculation (min) 44 min    Activity Tolerance Patient tolerated treatment well    Behavior During Therapy [facility] for tasks assessed/performed             Past Medical History:  Diagnosis Date   GERD (gastroesophageal reflux disease)    Rheumatoid arthritis (HCC)     Past Surgical History:  Procedure Laterality Date   HEMORRHOID SURGERY     LYMPH NODE BIOPSY     Neck   TRIGGER FINGER RELEASE Right    3rd    There were no vitals filed for this visit.   Subjective Assessment - 07/18/20 0849     Subjective Doing well, just stiff    Currently in Pain? No/denies                               [facility] Adult PT Treatment/Exercise - 07/18/20 0001       Neck Exercises: Machines for Strengthening   UBE (Upper Arm Bike) level 3 x 6 minutes    Cybex Row 45# 2x10    Lat Pull 45# 2x10      Neck Exercises: Standing   Other Standing Exercises weighted ball overhead reach with some PT over pressure, W backs with green 2 x10. seated at chair thoracic self mob 10 x 3" holds      Modalities   Modalities Traction      Traction   Type of Traction Cervical    Min (lbs) 12    Hold Time static    Time 10      Manual Therapy   Manual Therapy Soft tissue mobilization;Passive ROM    Soft tissue mobilization to cervical parapsinals    Passive ROM to the cervical spine in supine and sitting, some AAROM to end range. Forward flexion stretching. Cervical rotation MWM  and gentle contract relax      Neck Exercises: Stretches   Other Neck Stretches thoracic rotation stretch in ssitting 3 deep breaths with rotation x 4 each way                      PT Short Term Goals - 07/03/20 1056       PT SHORT TERM GOAL #1   Title Pt will be I and compliant with initial HEP.    Baseline prescribed at eval    Time 3    Period Weeks    Status On-going    Target Date 07/22/20      PT SHORT TERM GOAL #2   Title FOTO will be taken with LTG created by visit 3.    Baseline 45% taken visit 2    Time 3    Period Weeks    Status Achieved    Target Date 07/22/20               PT  Long Term Goals - 07/16/20 0926       PT LONG TERM GOAL #1   Title Pt will be I with advanced HEP for maintenance of symptoms.    Status On-going                   Plan - 07/18/20 2482     Clinical Impression Statement Pt reports traction may have helped a bit and would like to try again today so it was done at end of todays session. Trialed some MWM with very gentle contract relax stretching for rotation during MT and this was tolerated well with slight improvement in end range rotation compared to start os session afterward. Continue per POC    PT Next Visit Plan continue to adjust to what works and try to get better motion in the thoracic spine and better cervical ROM    Consulted and Agree with Plan of Care Patient             Patient will benefit from skilled therapeutic intervention in order to improve the following deficits and impairments:  Pain, Postural dysfunction, Impaired flexibility, Increased fascial restricitons, Improper body mechanics, Impaired perceived functional ability, Decreased range of motion, Hypomobility  Visit Diagnosis: Cervicalgia  Abnormal posture     Problem List Patient Active Problem List   Diagnosis Date Noted   Hives 07/11/2020   Rheumatoid arthritis (HCC) 07/11/2020   Osteoarthritis of left knee 07/11/2020    Cervical arthritis 07/11/2020   Gastroesophageal reflux disease 07/11/2020   Presbyopia of both eyes 04/20/2015   Pseudophakia of both eyes 04/20/2015    Anson Crofts, PT, DPT 07/18/2020, 9:28 AM  [facility] [address] [place], [place], [address] Phone: [phone]   Fax:  [phone]  Name: Terryon Pineiro MRN: [id number] Date of Birth: August 22, 1940

## 2020-07-30 ENCOUNTER — Encounter: Payer: Self-pay | Admitting: Physical Therapy

## 2020-07-30 ENCOUNTER — Ambulatory Visit: Payer: Medicare PPO | Admitting: Physical Therapy

## 2020-07-30 ENCOUNTER — Other Ambulatory Visit: Payer: Self-pay

## 2020-07-30 DIAGNOSIS — M542 Cervicalgia: Secondary | ICD-10-CM | POA: Diagnosis not present

## 2020-07-30 DIAGNOSIS — R293 Abnormal posture: Secondary | ICD-10-CM

## 2020-07-30 NOTE — Therapy (Signed)
[place]. [place], [place], [address] Phone: [phone]   Fax:  [phone]  Physical Therapy Treatment  Patient Details  Name: Joshua Ruiz MRN: [id number] Date of Birth: [birth year] Referring Provider (PT): Newman Nickels, MD   Encounter Date: 07/30/2020   PT End of Session - 07/30/20 1010     Visit Number 7    Number of Visits 12    Date for PT Re-Evaluation 08/26/20    Authorization Type [employer]    PT Start Time 0925    PT Stop Time 1010    PT Time Calculation (min) 45 min    Activity Tolerance Patient tolerated treatment well    Behavior During Therapy [facility] for tasks assessed/performed             Past Medical History:  Diagnosis Date   GERD (gastroesophageal reflux disease)    Rheumatoid arthritis ([place])     Past Surgical History:  Procedure Laterality Date   HEMORRHOID SURGERY     LYMPH NODE BIOPSY     Neck   TRIGGER FINGER RELEASE Right    3rd    There were no vitals filed for this visit.   Subjective Assessment - 07/30/20 0930     Subjective I am just always stiff, I think the work with your hands helps the most    Currently in Pain? No/denies    Pain Descriptors / Indicators Tightness    Aggravating Factors  worse in the morning                               [facility] Adult PT Treatment/Exercise - 07/30/20 0001       Neck Exercises: Machines for Strengthening   UBE (Upper Arm Bike) level 3 x 6 minutes      Neck Exercises: Standing   Other Standing Exercises weighted ball overhead reach with some PT over pressure, W backs with green 2 x10. seated at chair thoracic self mob 10 x 3" holds    Other Standing Exercises door way stretches, 8# upper trap and levator stretches      Manual Therapy   Manual Therapy Soft tissue mobilization;Passive ROM;Manual Traction    Soft tissue mobilization to cervical parapsinals    Passive ROM to the cervical spine  in supine and sitting, some AAROM to end range. Forward flexion stretching. Cervical rotation MWM and gentle contract relax    Manual Traction occipital release                      PT Short Term Goals - 07/03/20 1056       PT SHORT TERM GOAL #1   Title Pt will be I and compliant with initial HEP.    Baseline prescribed at eval    Time 3    Period Weeks    Status On-going    Target Date 07/22/20      PT SHORT TERM GOAL #2   Title FOTO will be taken with LTG created by visit 3.    Baseline 45% taken visit 2    Time 3    Period Weeks    Status Achieved    Target Date 07/22/20               PT Long Term Goals - 07/30/20 1012       PT LONG TERM GOAL #1   Title Pt  will be I with advanced HEP for maintenance of symptoms.    Status On-going      PT LONG TERM GOAL #2   Title Pt will increase B cervical rotation to >/= 60, in order to easily turn head to check blindspots for safety in driving.    Status On-going      PT LONG TERM GOAL #3   Title Pt will decrease max pain level to </= 3/10 with all I/ADLs.    Status Partially Met                   Plan - 07/30/20 1010     Clinical Impression Statement Patient very stiff, seems to improve some with the contract relax, He tends to turn with the shoulders, rather than turn with the head, side bending is the stiffest and he reports difficulty backing up car and truning to look    PT Next Visit Plan continue to adjust to what works and try to get better motion in the thoracic spine and better cervical ROM    Consulted and Agree with Plan of Care Patient             Patient will benefit from skilled therapeutic intervention in order to improve the following deficits and impairments:  Pain, Postural dysfunction, Impaired flexibility, Increased fascial restricitons, Improper body mechanics, Impaired perceived functional ability, Decreased range of motion, Hypomobility  Visit  Diagnosis: Cervicalgia  Abnormal posture     Problem List Patient Active Problem List   Diagnosis Date Noted   Hives 07/11/2020   Rheumatoid arthritis ([place]) 07/11/2020   Osteoarthritis of left knee 07/11/2020   Cervical arthritis 07/11/2020   Gastroesophageal reflux disease 07/11/2020   Presbyopia of both eyes 04/20/2015   Pseudophakia of both eyes 04/20/2015    Sumner Boast., PT 07/30/2020, 10:13 AM  [place]. [place], [place], [address] Phone: [phone]   Fax:  [phone]  Name: Joshua Ruiz MRN: [id number] Date of Birth: 05-02-1940

## 2020-08-01 ENCOUNTER — Other Ambulatory Visit: Payer: Self-pay

## 2020-08-01 ENCOUNTER — Encounter: Payer: Self-pay | Admitting: Physical Therapy

## 2020-08-01 ENCOUNTER — Ambulatory Visit: Payer: Medicare PPO | Admitting: Physical Therapy

## 2020-08-01 DIAGNOSIS — R293 Abnormal posture: Secondary | ICD-10-CM

## 2020-08-01 DIAGNOSIS — M542 Cervicalgia: Secondary | ICD-10-CM | POA: Diagnosis not present

## 2020-08-01 NOTE — Therapy (Signed)
[place]. [place], [place], [address] Phone: [phone]   Fax:  [phone]  Physical Therapy Treatment  Patient Details  Name: Joshua Ruiz MRN: [id number] Date of Birth: Feb 21, 1940 Referring Provider (PT): Newman Nickels, MD   Encounter Date: 08/01/2020   PT End of Session - 08/01/20 1108     Visit Number 8    Number of Visits 12    Date for PT Re-Evaluation 08/26/20    Authorization Type [employer] Medicare    PT Start Time 0926    PT Stop Time 7858    PT Time Calculation (min) 48 min    Activity Tolerance Patient tolerated treatment well    Behavior During Therapy [facility] for tasks assessed/performed             Past Medical History:  Diagnosis Date   GERD (gastroesophageal reflux disease)    Rheumatoid arthritis ([place])     Past Surgical History:  Procedure Laterality Date   HEMORRHOID SURGERY     LYMPH NODE BIOPSY     Neck   TRIGGER FINGER RELEASE Right    3rd    There were no vitals filed for this visit.   Subjective Assessment - 08/01/20 0939     Subjective ROM may be a little better    Currently in Pain? Yes    Pain Score 2     Pain Location Neck    Pain Descriptors / Indicators Tightness                [facility] PT Assessment - 08/01/20 0001       AROM   Right Shoulder Flexion 135 Degrees    Left Shoulder Flexion 140 Degrees    Cervical - Right Side Bend 13    Cervical - Left Side Bend 12    Cervical - Right Rotation 45    Cervical - Left Rotation 48                           [facility] Adult PT Treatment/Exercise - 08/01/20 0001       Neck Exercises: Machines for Strengthening   UBE (Upper Arm Bike) level 3 x 6 minutes    Cybex Chest Press 10# 2x10 working on the strech of the chest      Neck Exercises: Theraband   Shoulder External Rotation 20 reps;Green    Other Theraband Exercises green tband back to wall horizontal abduction      Neck Exercises:  Standing   Other Standing Exercises weighted ball overhead reach with some PT over pressure, W backs with green 2 x10. seated at chair thoracic self mob 10 x 3" holds      Manual Therapy   Manual Therapy Soft tissue mobilization;Passive ROM;Manual Traction    Soft tissue mobilization to cervical parapsinals    Passive ROM to the cervical spine in supine and sitting, some AAROM to end range. Forward flexion stretching. Cervical rotation MWM and gentle contract relax, some passive trunk rotation    Manual Traction occipital release                      PT Short Term Goals - 07/03/20 1056       PT SHORT TERM GOAL #1   Title Pt will be I and compliant with initial HEP.    Baseline prescribed at eval    Time 3    Period Weeks  Status On-going    Target Date 07/22/20      PT SHORT TERM GOAL #2   Title FOTO will be taken with LTG created by visit 3.    Baseline 45% taken visit 2    Time 3    Period Weeks    Status Achieved    Target Date 07/22/20               PT Long Term Goals - 07/30/20 1012       PT LONG TERM GOAL #1   Title Pt will be I with advanced HEP for maintenance of symptoms.    Status On-going      PT LONG TERM GOAL #2   Title Pt will increase B cervical rotation to >/= 60, in order to easily turn head to check blindspots for safety in driving.    Status On-going      PT LONG TERM GOAL #3   Title Pt will decrease max pain level to </= 3/10 with all I/ADLs.    Status Partially Met                   Plan - 08/01/20 1114     Clinical Impression Statement I remeasured today and he has a slinght increase in some cervical motions, the right shoulder ROM improved dramatically.  He reports that he feels like he has some better motions when driving    PT Next Visit Plan continue to adjust to what works and try to get better motion in the thoracic spine and better cervical ROM    Consulted and Agree with Plan of Care Patient              Patient will benefit from skilled therapeutic intervention in order to improve the following deficits and impairments:  Pain, Postural dysfunction, Impaired flexibility, Increased fascial restricitons, Improper body mechanics, Impaired perceived functional ability, Decreased range of motion, Hypomobility  Visit Diagnosis: Cervicalgia  Abnormal posture     Problem List Patient Active Problem List   Diagnosis Date Noted   Hives 07/11/2020   Rheumatoid arthritis ([place]) 07/11/2020   Osteoarthritis of left knee 07/11/2020   Cervical arthritis 07/11/2020   Gastroesophageal reflux disease 07/11/2020   Presbyopia of both eyes 04/20/2015   Pseudophakia of both eyes 04/20/2015    Sumner Boast., PT 08/01/2020, 11:17 AM  [place]. [place], [place], [address] Phone: [phone]   Fax:  [phone]  Name: Joshua Ruiz MRN: [id number] Date of Birth: [birth year]

## 2020-08-05 ENCOUNTER — Other Ambulatory Visit: Payer: Self-pay

## 2020-08-05 ENCOUNTER — Ambulatory Visit: Payer: Medicare PPO | Admitting: Physical Therapy

## 2020-08-05 ENCOUNTER — Encounter: Payer: Self-pay | Admitting: Physical Therapy

## 2020-08-05 DIAGNOSIS — M542 Cervicalgia: Secondary | ICD-10-CM

## 2020-08-05 DIAGNOSIS — R293 Abnormal posture: Secondary | ICD-10-CM

## 2020-08-05 NOTE — Therapy (Signed)
[place]. [place], [place], [address] Phone: [phone]   Fax:  [phone]  Physical Therapy Treatment  Patient Details  Name: Joshua Ruiz MRN: [id number] Date of Birth: Jul 31, 1940 Referring Provider (PT): Newman Nickels, MD   Encounter Date: 08/05/2020   PT End of Session - 08/05/20 1004     Visit Number 9    Number of Visits 12    Date for PT Re-Evaluation 08/26/20    Authorization Type [employer] Medicare    PT Start Time 0928    PT Stop Time 1017    PT Time Calculation (min) 49 min    Activity Tolerance Patient tolerated treatment well    Behavior During Therapy [facility] for tasks assessed/performed             Past Medical History:  Diagnosis Date   GERD (gastroesophageal reflux disease)    Rheumatoid arthritis ([place])     Past Surgical History:  Procedure Laterality Date   HEMORRHOID SURGERY     LYMPH NODE BIOPSY     Neck   TRIGGER FINGER RELEASE Right    3rd    There were no vitals filed for this visit.   Subjective Assessment - 08/05/20 0926     Subjective No pain, just stiff    Currently in Pain? No/denies                               [facility] Adult PT Treatment/Exercise - 08/05/20 0001       Neck Exercises: Machines for Strengthening   UBE (Upper Arm Bike) level 4 x 6 minutes    Cybex Row 45# 2x10    Cybex Chest Press 10# 2x10 working on the strech of the chest    Lat Pull 45# 2x10      Neck Exercises: Theraband   Shoulder External Rotation 20 reps;Green    Other Theraband Exercises green tband back to wall horizontal abduction      Neck Exercises: Standing   Other Standing Exercises weighted ball overhead reach with some PT over pressure, W backs with green 2 x10. seated at chair thoracic self mob 10 x 3" holds    Other Standing Exercises door way stretches, 8# upper trap and levator stretches      Traction   Type of Traction Cervical    Min (lbs) 14     Hold Time static    Time 10      Manual Therapy   Manual Therapy Soft tissue mobilization;Passive ROM    Soft tissue mobilization to cervical parapsinals    Passive ROM to the cervical spine in supine and sitting, some AAROM to end range. Forward flexion stretching. Cervical rotation MWM and gentle contract relax, some passive trunk rotation                      PT Short Term Goals - 07/03/20 1056       PT SHORT TERM GOAL #1   Title Pt will be I and compliant with initial HEP.    Baseline prescribed at eval    Time 3    Period Weeks    Status On-going    Target Date 07/22/20      PT SHORT TERM GOAL #2   Title FOTO will be taken with LTG created by visit 3.    Baseline 45% taken visit 2    Time  3    Period Weeks    Status Achieved    Target Date 07/22/20               PT Long Term Goals - 07/30/20 1012       PT LONG TERM GOAL #1   Title Pt will be I with advanced HEP for maintenance of symptoms.    Status On-going      PT LONG TERM GOAL #2   Title Pt will increase B cervical rotation to >/= 60, in order to easily turn head to check blindspots for safety in driving.    Status On-going      PT LONG TERM GOAL #3   Title Pt will decrease max pain level to </= 3/10 with all I/ADLs.    Status Partially Met                   Plan - 08/05/20 1004     Clinical Impression Statement Patient feels the ROM is improving demonstrated by reports of easier driving, he is still very stiff in the cervical motions of rotaitona nd side bending.  I went back to traction today to see if this will help, the contract relax end range ROM seems to help with the increaesd of ROM    PT Next Visit Plan continue to adjust to what works and try to get better motion in the thoracic spine and better cervical ROM    Consulted and Agree with Plan of Care Patient             Patient will benefit from skilled therapeutic intervention in order to improve the  following deficits and impairments:  Pain, Postural dysfunction, Impaired flexibility, Increased fascial restricitons, Improper body mechanics, Impaired perceived functional ability, Decreased range of motion, Hypomobility  Visit Diagnosis: Cervicalgia  Abnormal posture     Problem List Patient Active Problem List   Diagnosis Date Noted   Hives 07/11/2020   Rheumatoid arthritis ([place]) 07/11/2020   Osteoarthritis of left knee 07/11/2020   Cervical arthritis 07/11/2020   Gastroesophageal reflux disease 07/11/2020   Presbyopia of both eyes 04/20/2015   Pseudophakia of both eyes 04/20/2015    Sumner Boast., PT 08/05/2020, 10:06 AM  [place]. [place], [place], [address] Phone: [phone]   Fax:  [phone]  Name: Reyce Lubeck MRN: [id number] Date of Birth: [birth year]

## 2020-08-08 ENCOUNTER — Other Ambulatory Visit: Payer: Self-pay

## 2020-08-08 ENCOUNTER — Ambulatory Visit: Payer: Medicare PPO

## 2020-08-08 DIAGNOSIS — R293 Abnormal posture: Secondary | ICD-10-CM

## 2020-08-08 DIAGNOSIS — M542 Cervicalgia: Secondary | ICD-10-CM

## 2020-08-08 NOTE — Therapy (Signed)
[place]. [place], [place], [address] Phone: [phone]   Fax:  [phone]  Physical Therapy Treatment 10th visit Progress Note Reporting Period 07/01/20 to 08/08/20  See note below for Objective Data and Assessment of Progress/Goals.     Patient Details  Name: Joshua Ruiz MRN: [id number] Date of Birth: July 18, 1940 Referring Provider (PT): Newman Nickels, MD   Encounter Date: 08/08/2020   PT End of Session - 08/08/20 0934     Visit Number 10    Number of Visits 12    Date for PT Re-Evaluation 08/26/20    Authorization Type [employer] Medicare    PT Start Time 0930    PT Stop Time 7341    PT Time Calculation (min) 45 min    Activity Tolerance Patient tolerated treatment well    Behavior During Therapy [facility] for tasks assessed/performed             Past Medical History:  Diagnosis Date   GERD (gastroesophageal reflux disease)    Rheumatoid arthritis ([place])     Past Surgical History:  Procedure Laterality Date   HEMORRHOID SURGERY     LYMPH NODE BIOPSY     Neck   TRIGGER FINGER RELEASE Right    3rd    There were no vitals filed for this visit.   Subjective Assessment - 08/08/20 0934     Subjective No pain, just stiff thats worse in the mornings, a little better though    Currently in Pain? No/denies                               [facility] Adult PT Treatment/Exercise - 08/08/20 0001       Neck Exercises: Machines for Strengthening   UBE (Upper Arm Bike) level 4 x 6 minutes    Cybex Row 45# 3x10    Cybex Chest Press 10# 2x10 working on the strech of the chest    Lat Pull 45# 3x10      Neck Exercises: Theraband   Shoulder External Rotation 20 reps;Green   1/2 foam roll behind back   Other Theraband Exercises green tband back to wall horizontal abduction   1/2 foam roll behind back 2x10     Neck Exercises: Standing   Other Standing Exercises weighted ball overhead reach  with some PT over pressure, W backs with green 2 x10. seated at chair thoracic self mob 10 x 3" holds    Other Standing Exercises door way stretches, 8# upper trap and levator stretches      Traction   Type of Traction Cervical    Min (lbs) 14    Hold Time static    Time 10        Neck Exercises: Stretches   Upper Trapezius Stretch Right;Left;2 reps;10 seconds   chin tuck before stretch                     PT Short Term Goals - 08/08/20 0935       PT SHORT TERM GOAL #1   Title Pt will be I and compliant with initial HEP.    Baseline prescribed at eval    Time 3    Period Weeks    Status Achieved    Target Date 07/22/20      PT SHORT TERM GOAL #2   Title FOTO will be taken with LTG created by visit 3.  Baseline 45% taken visit 2    Time 3    Period Weeks    Status Achieved    Target Date 07/22/20               PT Long Term Goals - 08/08/20 0935       PT LONG TERM GOAL #1   Title Pt will be I with advanced HEP for maintenance of symptoms.    Status On-going      PT LONG TERM GOAL #2   Title Pt will increase B cervical rotation to >/= 60, in order to easily turn head to check blindspots for safety in driving.    Status On-going      PT LONG TERM GOAL #3   Title Pt will decrease max pain level to </= 3/10 with all I/ADLs.    Status Achieved   no greater than a 2/10     PT LONG TERM GOAL #4   Title FOTO increased to at least 58% to dmeo improved neck function    Status Achieved   59%                  Plan - 08/08/20 0953     Clinical Impression Statement Pt is making good progress in strength and mobility but still dmeonstrating neck stiffness. he has met 50% of long term goals at this time. Still does dmeonstrate neck mms tightness and spine hypomobility but is tolerant to interventions. Some relief with traction also reported. Plan is to continue per POC to work on improving cervical and thoracic mobilty, strength.    PT  Treatment/Interventions Spinal Manipulations;Joint Manipulations;ADLs/Self Care Home Management;Cryotherapy;Electrical Stimulation;Iontophoresis 35m/ml Dexamethasone;Moist Heat;Traction;Therapeutic activities;Therapeutic exercise;Neuromuscular re-education;Patient/family education;Manual techniques;Passive range of motion;Dry needling;Taping    PT Next Visit Plan continue to adjust to what works and try to get better motion in the thoracic spine and better cervical ROM    Consulted and Agree with Plan of Care Patient             Patient will benefit from skilled therapeutic intervention in order to improve the following deficits and impairments:  Pain, Postural dysfunction, Impaired flexibility, Increased fascial restricitons, Improper body mechanics, Impaired perceived functional ability, Decreased range of motion, Hypomobility  Visit Diagnosis: Cervicalgia  Abnormal posture     Problem List Patient Active Problem List   Diagnosis Date Noted   Hives 07/11/2020   [place] 07/11/[place]   Osteoarthritis of left knee 07/11/2020   Cervical arthritis 07/11/2020   Gastroesophageal reflux disease 07/11/2020   Presbyopia of both eyes 04/20/2015   Pseudophakia of both eyes 04/20/2015    MHall Busing PT, DPT 08/08/2020, 10:02 AM  [place] [address]Phone: [phone]  Fax:  [phone] Name: Joshua PartainMRN: [id number] of Birth: 110-Nov-1942

## 2020-08-12 ENCOUNTER — Ambulatory Visit: Payer: Medicare PPO | Attending: Rheumatology | Admitting: Physical Therapy

## 2020-08-12 ENCOUNTER — Encounter: Payer: Self-pay | Admitting: Physical Therapy

## 2020-08-12 ENCOUNTER — Other Ambulatory Visit: Payer: Self-pay

## 2020-08-12 DIAGNOSIS — M542 Cervicalgia: Secondary | ICD-10-CM | POA: Insufficient documentation

## 2020-08-12 DIAGNOSIS — R293 Abnormal posture: Secondary | ICD-10-CM | POA: Diagnosis present

## 2020-08-12 NOTE — Therapy (Signed)
[facility] [address] [place], [place], [address] Phone: [phone]   Fax:  [phone]  Physical Therapy Treatment  Patient Details  Name: Joshua Ruiz MRN: [id number] Date of Birth: 10-29-1940 Referring Provider (PT): Areatha Keas, MD   Encounter Date: 08/12/2020   PT End of Session - 08/12/20 1008     Visit Number 11    Number of Visits 12    Date for PT Re-Evaluation 08/26/20    Authorization Type [employer]    PT Start Time 0925    PT Stop Time 1021    PT Time Calculation (min) 56 min    Activity Tolerance Patient tolerated treatment well    Behavior During Therapy [facility] for tasks assessed/performed             Past Medical History:  Diagnosis Date   GERD (gastroesophageal reflux disease)    Rheumatoid arthritis (HCC)     Past Surgical History:  Procedure Laterality Date   HEMORRHOID SURGERY     LYMPH NODE BIOPSY     Neck   TRIGGER FINGER RELEASE Right    3rd    There were no vitals filed for this visit.   Subjective Assessment - 08/12/20 0932     Subjective I am just stiff    Currently in Pain? No/denies                               [facility] Adult PT Treatment/Exercise - 08/12/20 0001       Neck Exercises: Machines for Strengthening   UBE (Upper Arm Bike) level 4 x 6 minutes    Cybex Row 45# 3x10    Cybex Chest Press 10# 2x10 working on the strech of the chest    Lat Pull 45# 3x10      Neck Exercises: Standing   Other Standing Exercises weighted ball overhead reach with some PT over pressure, W backs with green 2 x10. seated at chair thoracic self mob 10 x 3" holds    Other Standing Exercises door way stretches, 8# upper trap and levator stretches, use of foam roller rolling up wall for ROM and scapular motions      Modalities   Modalities Electrical Stimulation      Electrical Stimulation   Electrical Stimulation Location cervical area    Electrical  Stimulation Action IFC    Electrical Stimulation Parameters supine    Electrical Stimulation Goals Tone;Pain      Manual Therapy   Manual Therapy Soft tissue mobilization;Passive ROM    Soft tissue mobilization to cervical parapsinals    Passive ROM to the cervical spine in supine and sitting, some AAROM to end range. Forward flexion stretching. Cervical rotation MWM and gentle contract relax, some passive trunk rotation                      PT Short Term Goals - 08/08/20 0935       PT SHORT TERM GOAL #1   Title Pt will be I and compliant with initial HEP.    Baseline prescribed at eval    Time 3    Period Weeks    Status Achieved    Target Date 07/22/20      PT SHORT TERM GOAL #2   Title FOTO will be taken with LTG created by visit 3.    Baseline 45% taken visit 2  Time 3    Period Weeks    Status Achieved    Target Date 07/22/20               PT Long Term Goals - 08/08/20 0935       PT LONG TERM GOAL #1   Title Pt will be I with advanced HEP for maintenance of symptoms.    Status On-going      PT LONG TERM GOAL #2   Title Pt will increase B cervical rotation to >/= 60, in order to easily turn head to check blindspots for safety in driving.    Status On-going      PT LONG TERM GOAL #3   Title Pt will decrease max pain level to </= 3/10 with all I/ADLs.    Status Achieved   no greater than a 2/10     PT LONG TERM GOAL #4   Title FOTO increased to at least 58% to dmeo improved neck function    Status Achieved   59%                  Plan - 08/12/20 1009     Clinical Impression Statement ROM seems to be improving, he continues to come in and c/o stiffness mostly, he is very tight in the mms of the neck, I feel like I am getting a softer end range with rotation and side bending.  I tried estim today to see if we could gain more ROM with looser mms and see what he thought    PT Next Visit Plan see how we are doing with the ROM, will need  to renew with [facility]    Consulted and Agree with Plan of Care Patient             Patient will benefit from skilled therapeutic intervention in order to improve the following deficits and impairments:  Pain, Postural dysfunction, Impaired flexibility, Increased fascial restricitons, Improper body mechanics, Impaired perceived functional ability, Decreased range of motion, Hypomobility  Visit Diagnosis: Cervicalgia  Abnormal posture     Problem List Patient Active Problem List   Diagnosis Date Noted   Hives 07/11/2020   Rheumatoid arthritis (HCC) 07/11/2020   Osteoarthritis of left knee 07/11/2020   Cervical arthritis 07/11/2020   Gastroesophageal reflux disease 07/11/2020   Presbyopia of both eyes 04/20/2015   Pseudophakia of both eyes 04/20/2015    Jearld Lesch., PT 08/12/2020, 10:11 AM  [facility] [address] [place], [place], [address] Phone: [phone]   Fax:  [phone]  Name: Joshua Ruiz MRN: [id number] Date of Birth: Nov 22, 1940

## 2020-08-13 ENCOUNTER — Ambulatory Visit: Payer: Medicare PPO | Admitting: Family Medicine

## 2020-08-13 VITALS — BP 144/80 | HR 59 | Temp 97.6°F | Ht 68.0 in | Wt 193.6 lb

## 2020-08-13 DIAGNOSIS — N451 Epididymitis: Secondary | ICD-10-CM | POA: Diagnosis not present

## 2020-08-13 DIAGNOSIS — M069 Rheumatoid arthritis, unspecified: Secondary | ICD-10-CM | POA: Diagnosis not present

## 2020-08-13 NOTE — Patient Instructions (Signed)
Avoid heavy lifting/straining. Wear briefs rather than boxers. Use periodic Aleve for discomfort.

## 2020-08-13 NOTE — Progress Notes (Signed)
[facility] [address] [place] [address] Dept: [phone] Dept Fax: [phone]  Office Visit  Subjective:    Patient ID: Joshua Ruiz, male    DOB: 09/09/1940, 80 y.o..   MRN: [id number]  Chief Complaint  Patient presents with   Follow-up    1 month f/u     History of Present Illness:  Patient is in today for reassess ment of his testicular pain. He did note improvement in his symptoms with taking a course of Cipro. However, over the past 4-5 days he has had a return of soreness in this area. Mr. Salguero has a history of rheumatoid arthritis. He notes some stiffness and soreness in his left shoulder. He is undergoing PT for cervical stiffness and pain and feels this is improving. He continues to take MTX and Plaquenil.  Past Medical History: Patient Active Problem List   Diagnosis Date Noted   Chronic epididymitis 08/13/2020   Hives 07/11/2020   Rheumatoid arthritis (HCC) 07/11/2020   Osteoarthritis of left knee 07/11/2020   Cervical arthritis 07/11/2020   Gastroesophageal reflux disease 07/11/2020   Presbyopia of both eyes 04/20/2015   Pseudophakia of both eyes 04/20/2015   Past Surgical History:  Procedure Laterality Date   HEMORRHOID SURGERY     LYMPH NODE BIOPSY     Neck   TRIGGER FINGER RELEASE Right    3rd   Family History  Problem Relation Age of Onset   Rheum arthritis Mother    Diabetes Mother    Cancer Sister        Lung   Cancer Brother        Lung   Outpatient Medications Prior to Visit  Medication Sig Dispense Refill   cetirizine (ZYRTEC) 10 MG tablet Take by mouth.     folic acid (FOLVITE) 1 MG tablet Take by mouth.     hydroxychloroquine (PLAQUENIL) 200 MG tablet Take by mouth.     loratadine (CLARITIN) 10 MG tablet Take by mouth.     methotrexate (RHEUMATREX) 2.5 MG tablet Take 2.5 mg by mouth once a week. Caution:Chemotherapy. Protect from light.     Multiple Vitamin  (MULTI-VITAMIN) tablet Take by mouth.     omeprazole (PRILOSEC) 20 MG capsule Take by mouth.     No facility-administered medications prior to visit.    No Known Allergies    Objective:   Today's Vitals   08/13/20 1304  BP: (!) 144/80  Pulse: (!) 59  Temp: 97.6 F (36.4 C)  TempSrc: Temporal  SpO2: 99%  Weight: 193 lb 9.6 oz (87.8 kg)  Height: 5\' 8"  (1.727 m)   Body mass index is 29.44 kg/m.   General: Well developed, well nourished. No acute distress. Extremities: Full ROM. No joint swelling or tenderness.  Psych: Alert and oriented x3. Normal mood and affect.  Health Maintenance Due  Topic Date Due   TETANUS/TDAP  Never done   Zoster Vaccines- Shingrix (1 of 2) Never done   PNA vac Low Risk Adult (1 of 2 - PCV13) Never done   COVID-19 Vaccine (3 - Pfizer risk series) 04/17/2019   INFLUENZA VACCINE  08/11/2020     Assessment & Plan:   1. Chronic epididymitis Mr. Pineda appears to have chronic noninfectious epididymitis. This may be an aspect of his autoimmune disease. I recommend he use scrotal lifting (briefs vs. boxers), avoid straining.lifting, and use Aleve PRN discomfort. I offered urology referral, but he does not want to pursue this  at the current time.  2. Rheumatoid arthritis, involving unspecified site, unspecified whether rheumatoid factor present (HCC) Stable on MTX and Plaquenil.  Loyola Mast, MD

## 2020-08-15 ENCOUNTER — Ambulatory Visit: Payer: Medicare PPO

## 2020-08-19 ENCOUNTER — Other Ambulatory Visit: Payer: Self-pay

## 2020-08-19 ENCOUNTER — Ambulatory Visit: Payer: Medicare PPO | Admitting: Physical Therapy

## 2020-08-19 ENCOUNTER — Encounter: Payer: Self-pay | Admitting: Physical Therapy

## 2020-08-19 DIAGNOSIS — M542 Cervicalgia: Secondary | ICD-10-CM | POA: Diagnosis not present

## 2020-08-19 DIAGNOSIS — R293 Abnormal posture: Secondary | ICD-10-CM

## 2020-08-19 NOTE — Therapy (Signed)
[place] [place], [place], [address] Phone: [phone]   Fax:  [phone]  Physical Therapy Treatment  Patient Details  Name: Joshua Ruiz MRN: [id number] Date of Birth: 1940/12/21 Referring Provider (PT): Newman Nickels, MD   Encounter Date: 08/19/2020   PT End of Session - 08/19/20 1008     Visit Number 12    Date for PT Re-Evaluation 08/26/20    Authorization Type [employer] Medicare    PT Start Time 0925    PT Stop Time 1022    PT Time Calculation (min) 57 min    Activity Tolerance Patient tolerated treatment well    Behavior During Therapy [facility] for tasks assessed/performed             Past Medical History:  Diagnosis Date   GERD (gastroesophageal reflux disease)    Rheumatoid arthritis ([place])     Past Surgical History:  Procedure Laterality Date   HEMORRHOID SURGERY     LYMPH NODE BIOPSY     Neck   TRIGGER FINGER RELEASE Right    3rd    There were no vitals filed for this visit.   Subjective Assessment - 08/19/20 0931     Subjective I wake up really stiff but it gets better as the day goes on    Currently in Pain? No/denies                               [facility] Adult PT Treatment/Exercise - 08/19/20 0001       Neck Exercises: Machines for Strengthening   UBE (Upper Arm Bike) level 4 x 6 minutes    Cybex Row 45# 3x10    Lat Pull 45# 3x10      Neck Exercises: Theraband   Shoulder External Rotation 20 reps;Green    Horizontal ABduction Green;20 reps      Neck Exercises: Stretches   Other Neck Stretches doorway stretch      Shoulder Exercises: Standing   Extension AAROM;Both;20 reps    Extension Limitations with PT overpressure and cues to stay up right    Other Standing Exercises pull up bar hang for shoulder ROM      Electrical Stimulation   Electrical Stimulation Location cervical area    Electrical Stimulation Action IFC    Electrical Stimulation  Parameters supine    Electrical Stimulation Goals Tone;Pain   pateint reports that this seemed to helped     Manual Therapy   Manual Therapy Soft tissue mobilization;Passive ROM    Soft tissue mobilization to cervical parapsinals    Passive ROM to the cervical spine in supine and sitting, some AAROM to end range. Forward flexion stretching. Cervical rotation MWM and gentle contract relax, some passive trunk rotation                      PT Short Term Goals - 08/08/20 0935       PT SHORT TERM GOAL #1   Title Pt will be I and compliant with initial HEP.    Baseline prescribed at eval    Time 3    Period Weeks    Status Achieved    Target Date 07/22/20      PT SHORT TERM GOAL #2   Title FOTO will be taken with LTG created by visit 3.    Baseline 45% taken visit 2    Time 3  Period Weeks    Status Achieved    Target Date 07/22/20               PT Long Term Goals - 08/19/20 1010       PT LONG TERM GOAL #2   Title Pt will increase B cervical rotation to >/= 60, in order to easily turn head to check blindspots for safety in driving.    Status Partially Met                   Plan - 08/19/20 1009     Clinical Impression Statement Patient had reported that he felt the estim helped, reports that the ROM is feeling better but still very tight in the morning    PT Next Visit Plan may need to do renewal for [employer]    Consulted and Agree with Plan of Care Patient             Patient will benefit from skilled therapeutic intervention in order to improve the following deficits and impairments:  Pain, Postural dysfunction, Impaired flexibility, Increased fascial restricitons, Improper body mechanics, Impaired perceived functional ability, Decreased range of motion, Hypomobility  Visit Diagnosis: Cervicalgia  Abnormal posture     Problem List Patient Active Problem List   Diagnosis Date Noted   Chronic epididymitis 08/13/2020   Hives  07/11/2020   Rheumatoid arthritis ([place]) 07/11/2020   Osteoarthritis of left knee 07/11/2020   Cervical arthritis 07/11/2020   Gastroesophageal reflux disease 07/11/2020   Presbyopia of both eyes 04/20/2015   Pseudophakia of both eyes 04/20/2015    Sumner Boast., PT 08/19/2020, 10:11 AM  [place]. [place], [place], [address] Phone: [phone]   Fax:  [phone]  Name: Ronda Kazmi MRN: [id number] Date of Birth: 1940-10-04

## 2020-08-26 ENCOUNTER — Encounter: Payer: Self-pay | Admitting: Physical Therapy

## 2020-08-26 ENCOUNTER — Other Ambulatory Visit: Payer: Self-pay

## 2020-08-26 ENCOUNTER — Ambulatory Visit: Payer: Medicare PPO | Admitting: Physical Therapy

## 2020-08-26 DIAGNOSIS — M542 Cervicalgia: Secondary | ICD-10-CM | POA: Diagnosis not present

## 2020-08-26 DIAGNOSIS — R293 Abnormal posture: Secondary | ICD-10-CM

## 2020-08-26 NOTE — Therapy (Signed)
[place]. [place], [place], [address] Phone: [phone]   Fax:  [phone]  Physical Therapy Treatment  Patient Details  Name: Joshua Ruiz MRN: [id number] Date of Birth: 08-Jun-1940 Referring Provider (PT): Newman Nickels, MD   Encounter Date: 08/26/2020   PT End of Session - 08/26/20 1211     Visit Number 13    Date for PT Re-Evaluation 08/26/20    Authorization Type [employer] Medicare    PT Start Time 1012    PT Stop Time 1100    PT Time Calculation (min) 48 min    Activity Tolerance Patient tolerated treatment well    Behavior During Therapy [facility] for tasks assessed/performed             Past Medical History:  Diagnosis Date   GERD (gastroesophageal reflux disease)    Rheumatoid arthritis ([place])     Past Surgical History:  Procedure Laterality Date   HEMORRHOID SURGERY     LYMPH NODE BIOPSY     Neck   TRIGGER FINGER RELEASE Right    3rd    There were no vitals filed for this visit.   Subjective Assessment - 08/26/20 1017     Subjective Just stiff    Currently in Pain? No/denies                [facility] PT Assessment - 08/26/20 0001       AROM   Right Shoulder Flexion 142 Degrees    Left Shoulder Flexion 140 Degrees    Cervical Flexion 30    Cervical Extension 30    Cervical - Right Side Bend 13    Cervical - Left Side Bend 12    Cervical - Right Rotation 46    Cervical - Left Rotation 48                           [facility] Adult PT Treatment/Exercise - 08/26/20 0001       Neck Exercises: Machines for Strengthening   UBE (Upper Arm Bike) level 4 x 6 minutes    Cybex Row 45# 3x10    Lat Pull 45# 3x10      Neck Exercises: Theraband   Shoulder External Rotation 20 reps;Green    Horizontal ABduction Green;20 reps      Neck Exercises: Standing   Other Standing Exercises door way stretches, 8# upper trap and levator stretches, use of foam roller rolling up  wall for ROM and scapular motions      Manual Therapy   Manual Therapy Joint mobilization    Joint Mobilization Tspine PA and rotation grade III    Soft tissue mobilization \    Passive ROM to the cervical spine in supine and sitting, some AAROM to end range. Forward flexion stretching. Cervical rotation MWM and gentle contract relax, some passive trunk rotation                      PT Short Term Goals - 08/08/20 0935       PT SHORT TERM GOAL #1   Title Pt will be I and compliant with initial HEP.    Baseline prescribed at eval    Time 3    Period Weeks    Status Achieved    Target Date 07/22/20      PT SHORT TERM GOAL #2   Title FOTO will be taken with LTG created by  visit 3.    Baseline 45% taken visit 2    Time 3    Period Weeks    Status Achieved    Target Date 07/22/20               PT Long Term Goals - 08/26/20 1214       PT LONG TERM GOAL #1   Title Pt will be I with advanced HEP for maintenance of symptoms.    Status Partially Met      PT LONG TERM GOAL #2   Title Pt will increase B cervical rotation to >/= 60, in order to easily turn head to check blindspots for safety in driving.    Status Partially Met      PT LONG TERM GOAL #3   Title Pt will decrease max pain level to </= 3/10 with all I/ADLs.    Status Achieved      PT LONG TERM GOAL #4   Title FOTO increased to at least 58% to dmeo improved neck function    Status Achieved                   Plan - 08/26/20 1211     Clinical Impression Statement Patient has made great improvement in the right shoulder AROM, he has good improvement in cervical ROM, he reports less issues backing up the car, he is still very tight and limited in cervical ROM but is improving, he is very stiff in the segmental mobility in the thoracic spine.  The mms in the cervical area remain very tense.  He reports that he is more aware of his posture but still exhibits fwd head and some rounded shoulders  at times    PT Frequency 2x / week    PT Duration 8 weeks    PT Treatment/Interventions Spinal Manipulations;Joint Manipulations;ADLs/Self Care Home Management;Cryotherapy;Electrical Stimulation;Iontophoresis 32m/ml Dexamethasone;Moist Heat;Traction;Therapeutic activities;Therapeutic exercise;Neuromuscular re-education;Patient/family education;Manual techniques;Passive range of motion;Dry needling;Taping    PT Next Visit Plan would like to continue to work on the functional ROM    Consulted and Agree with Plan of Care Patient             Patient will benefit from skilled therapeutic intervention in order to improve the following deficits and impairments:  Pain, Postural dysfunction, Impaired flexibility, Increased fascial restricitons, Improper body mechanics, Impaired perceived functional ability, Decreased range of motion, Hypomobility  Visit Diagnosis: Cervicalgia  Abnormal posture     Problem List Patient Active Problem List   Diagnosis Date Noted   Chronic epididymitis 08/13/2020   Hives 07/11/2020   [place] 07/11/[place]   Osteoarthritis of left knee 07/11/2020   Cervical arthritis 07/11/2020   Gastroesophageal reflux disease 07/11/2020   Presbyopia of both eyes 04/20/2015   Pseudophakia of both eyes 04/20/2015    ASumner Ruiz, PT 08/26/2020, 12:15 PM  [place] [address]Phone: [phone]  Fax:  [phone] Name: SQuintavious RinckMRN: [id number] of Birth: 117-May-1942

## 2020-09-09 ENCOUNTER — Ambulatory Visit: Payer: Medicare PPO

## 2020-09-12 ENCOUNTER — Other Ambulatory Visit: Payer: Self-pay

## 2020-09-12 ENCOUNTER — Ambulatory Visit: Payer: Medicare PPO | Attending: Rheumatology

## 2020-09-12 DIAGNOSIS — R293 Abnormal posture: Secondary | ICD-10-CM | POA: Insufficient documentation

## 2020-09-12 DIAGNOSIS — M542 Cervicalgia: Secondary | ICD-10-CM | POA: Diagnosis not present

## 2020-09-12 NOTE — Therapy (Signed)
[place]. [place], [place], [address] Phone: [phone]   Fax:  [phone]  Physical Therapy Treatment  Patient Details  Name: Joshua Ruiz MRN: [id number] Date of Birth: February 12, 1940 Referring Provider (PT): Newman Nickels, MD   Encounter Date: 09/12/2020   PT End of Session - 09/12/20 1018     Visit Number 14    Date for PT Re-Evaluation 11/09/20    Authorization Type [employer] Medicare    PT Start Time 1014    PT Stop Time 2   Pt asked to end session 10 minutes early   PT Time Calculation (min) 36 min    Activity Tolerance Patient tolerated treatment well    Behavior During Therapy [facility] for tasks assessed/performed             Past Medical History:  Diagnosis Date   GERD (gastroesophageal reflux disease)    Rheumatoid arthritis ([place])     Past Surgical History:  Procedure Laterality Date   HEMORRHOID SURGERY     LYMPH NODE BIOPSY     Neck   TRIGGER FINGER RELEASE Right    3rd    There were no vitals filed for this visit.   Subjective Assessment - 09/12/20 1017     Subjective no pain, still stiff but better than when they started    Currently in Pain? No/denies                               [facility] Adult PT Treatment/Exercise - 09/12/20 0001       Neck Exercises: Machines for Strengthening   UBE (Upper Arm Bike) level 4 x 3 minutes each way    Cybex Row 45# 2 x 15    Lat Pull 45# 2 x 15      Neck Exercises: Standing   Wall Wash wall slides + lower trap set/lift off 3" x 10    Other Standing Exercises small red pball overhead ball taps to wall x 10.    Other Standing Exercises door way stretches  3 way - 30" x 2. UT stretch with 8# held in each hand 30" x 2      Manual Therapy   Joint Mobilization Tspine PA and rotation grade III    Passive ROM to the cervical spine in supine some AAROM to end range. Forward flexion stretching. Cervical rotation MWM and gentle  contract relax, some passive trunk rotation                      PT Short Term Goals - 08/08/20 0935       PT SHORT TERM GOAL #1   Title Pt will be I and compliant with initial HEP.    Baseline prescribed at eval    Time 3    Period Weeks    Status Achieved    Target Date 07/22/20      PT SHORT TERM GOAL #2   Title FOTO will be taken with LTG created by visit 3.    Baseline 45% taken visit 2    Time 3    Period Weeks    Status Achieved    Target Date 07/22/20               PT Long Term Goals - 08/26/20 1214       PT LONG TERM GOAL #1   Title Pt will be  I with advanced HEP for maintenance of symptoms.    Status Partially Met      PT LONG TERM GOAL #2   Title Pt will increase B cervical rotation to >/= 60, in order to easily turn head to check blindspots for safety in driving.    Status Partially Met      PT LONG TERM GOAL #3   Title Pt will decrease max pain level to </= 3/10 with all I/ADLs.    Status Achieved      PT LONG TERM GOAL #4   Title FOTO increased to at least 58% to dmeo improved neck function    Status Achieved                   Plan - 09/12/20 1056     Clinical Impression Statement Pt tolerated interventions well today. Reported some tightness in R shoulder with stretching. Able to achieve slighlty improved end range rotaiton after MT today. Did increase reps at same weight for machine interventions today and this was tolerated fairly, some fatigue observed with decreasing rang eof mvmt towards end of  sets.  Pt requests to end session early d/t another appointment that he must leave for    PT Frequency 2x / week    PT Duration 8 weeks    PT Treatment/Interventions Spinal Manipulations;Joint Manipulations;ADLs/Self Care Home Management;Cryotherapy;Electrical Stimulation;Iontophoresis 29m/ml Dexamethasone;Moist Heat;Traction;Therapeutic activities;Therapeutic exercise;Neuromuscular re-education;Patient/family education;Manual  techniques;Passive range of motion;Dry needling;Taping    PT Next Visit Plan would like to continue to work on the functional ROM    Consulted and Agree with Plan of Care Patient             Patient will benefit from skilled therapeutic intervention in order to improve the following deficits and impairments:  Pain, Postural dysfunction, Impaired flexibility, Increased fascial restricitons, Improper body mechanics, Impaired perceived functional ability, Decreased range of motion, Hypomobility  Visit Diagnosis: Cervicalgia  Abnormal posture     Problem List Patient Active Problem List   Diagnosis Date Noted   Chronic epididymitis 08/13/2020   Hives 07/11/2020   [place]heumatoid arthritis ([place] 07/11/2020   Osteoarthritis of left knee 07/11/2020   Cervical arthritis 07/11/2020   Gastroesophageal reflux disease 07/11/2020   Presbyopia of both eyes 04/20/2015   Pseudophakia of both eyes 04/20/2015    MHall Busing PT, DPT 09/12/2020, 10:59 AM  [place] [address]Phone: [phone]  Fax:  [phone] Name: Joshua BoutelleMRN: [id number] of Birth: 101-24-1942

## 2020-09-16 ENCOUNTER — Other Ambulatory Visit: Payer: Self-pay

## 2020-09-16 ENCOUNTER — Ambulatory Visit: Payer: Medicare PPO | Admitting: Physical Therapy

## 2020-09-16 ENCOUNTER — Encounter: Payer: Self-pay | Admitting: Physical Therapy

## 2020-09-16 DIAGNOSIS — R293 Abnormal posture: Secondary | ICD-10-CM

## 2020-09-16 DIAGNOSIS — M542 Cervicalgia: Secondary | ICD-10-CM | POA: Diagnosis not present

## 2020-09-16 NOTE — Therapy (Signed)
[place]. [place], [place], [address] Phone: [phone]   Fax:  [phone]  Physical Therapy Treatment  Patient Details  Name: Joshua Ruiz MRN: [id number] Date of Birth: Dec 15, 1940 Referring Provider (PT): Newman Nickels, MD   Encounter Date: 09/16/2020   PT End of Session - 09/16/20 0917     Visit Number 15    Date for PT Re-Evaluation 11/09/20    Authorization - Visit Number 0.17    PT Start Time 0840    PT Stop Time 0930    PT Time Calculation (min) 50 min    Activity Tolerance Patient tolerated treatment well    Behavior During Therapy [facility] for tasks assessed/performed             Past Medical History:  Diagnosis Date   GERD (gastroesophageal reflux disease)    Rheumatoid arthritis ([place])     Past Surgical History:  Procedure Laterality Date   HEMORRHOID SURGERY     LYMPH NODE BIOPSY     Neck   TRIGGER FINGER RELEASE Right    3rd    There were no vitals filed for this visit.   Subjective Assessment - 09/16/20 0843     Subjective I am just stiff in the morning with some pain, the pain gets better but I stay pretty stiff    Currently in Pain? No/denies                               [facility] Adult PT Treatment/Exercise - 09/16/20 0001       Neck Exercises: Machines for Strengthening   UBE (Upper Arm Bike) level 4 x 3 minutes each way      Neck Exercises: Standing   Wall Wash wall slides + lower trap set/lift off 3" x 10    Other Standing Exercises small red pball overhead ball taps to wall x 10.    Other Standing Exercises door way stretches  3 way - 30" x 2. UT stretch with 8# held in each hand 30" x 2      Neck Exercises: Seated   X to V 20 reps;Weight    X to V Weights (lbs) 2    W Back 20 reps      Manual Therapy   Joint Mobilization Tspine PA and rotation grade III    Soft tissue mobilization to the cervical, upper trap and the SCM area    Passive ROM  rotation, sidebending and then trunk rotation and side bending    Manual Traction occipital release                      PT Short Term Goals - 08/08/20 0935       PT SHORT TERM GOAL #1   Title Pt will be I and compliant with initial HEP.    Baseline prescribed at eval    Time 3    Period Weeks    Status Achieved    Target Date 07/22/20      PT SHORT TERM GOAL #2   Title FOTO will be taken with LTG created by visit 3.    Baseline 45% taken visit 2    Time 3    Period Weeks    Status Achieved    Target Date 07/22/20               PT Long Term Goals -  09/16/20 0920       PT LONG TERM GOAL #2   Title Pt will increase B cervical rotation to >/= 60, in order to easily turn head to check blindspots for safety in driving.    Status Partially Met      PT LONG TERM GOAL #3   Title Pt will decrease max pain level to </= 3/10 with all I/ADLs.    Status Achieved                   Plan - 09/16/20 0918     Clinical Impression Statement Patient has increased shoulder ROM and reports that he feels like he has better ROM of the head an dneck, he is very stiff in his trunk and I started some more work on this today, the top of the SCM is very tight but he does not report tenderness here.    PT Next Visit Plan try to work on cervical and trunk mobility for better movements and easier function    Consulted and Agree with Plan of Care Patient             Patient will benefit from skilled therapeutic intervention in order to improve the following deficits and impairments:  Pain, Postural dysfunction, Impaired flexibility, Increased fascial restricitons, Improper body mechanics, Impaired perceived functional ability, Decreased range of motion, Hypomobility  Visit Diagnosis: Cervicalgia  Abnormal posture     Problem List Patient Active Problem List   Diagnosis Date Noted   Chronic epididymitis 08/13/2020   Hives 07/11/2020   Rheumatoid arthritis  ([place]) 07/11/2020   Osteoarthritis of left knee 07/11/2020   Cervical arthritis 07/11/2020   Gastroesophageal reflux disease 07/11/2020   Presbyopia of both eyes 04/20/2015   Pseudophakia of both eyes 04/20/2015    Sumner Boast., PT 09/16/2020, 9:21 AM  [place]. [place], [place], [address] Phone: [phone]   Fax:  [phone]  Name: Joshua Ruiz MRN: [id number] Date of Birth: 10/13/1940

## 2020-09-19 ENCOUNTER — Ambulatory Visit: Payer: Medicare PPO | Admitting: Physical Therapy

## 2020-09-23 ENCOUNTER — Ambulatory Visit: Payer: Medicare PPO | Admitting: Physical Therapy

## 2020-09-23 ENCOUNTER — Other Ambulatory Visit: Payer: Self-pay

## 2020-09-23 ENCOUNTER — Encounter: Payer: Self-pay | Admitting: Physical Therapy

## 2020-09-23 DIAGNOSIS — R293 Abnormal posture: Secondary | ICD-10-CM

## 2020-09-23 DIAGNOSIS — M542 Cervicalgia: Secondary | ICD-10-CM | POA: Diagnosis not present

## 2020-09-23 NOTE — Therapy (Signed)
[place]. [place], [place], [address] Phone: [phone]   Fax:  [phone]  Physical Therapy Treatment  Patient Details  Name: Joshua Ruiz MRN: [id number] Date of Birth: Feb 16, 1940 Referring Provider (PT): Newman Nickels, MD   Encounter Date: 09/23/2020   PT End of Session - 09/23/20 1055     Visit Number 16    Date for PT Re-Evaluation 11/09/20    Authorization Type [employer]    PT Start Time 1013    PT Stop Time 1102    PT Time Calculation (min) 49 min             Past Medical History:  Diagnosis Date   GERD (gastroesophageal reflux disease)    Rheumatoid arthritis ([place])     Past Surgical History:  Procedure Laterality Date   HEMORRHOID SURGERY     LYMPH NODE BIOPSY     Neck   TRIGGER FINGER RELEASE Right    3rd    There were no vitals filed for this visit.   Subjective Assessment - 09/23/20 1016     Subjective Just run ragged taing care of so much.  I just get so stiff    Currently in Pain? No/denies                [facility] PT Assessment - 09/23/20 0001       AROM   Right Shoulder Flexion 150 Degrees                           [facility] Adult PT Treatment/Exercise - 09/23/20 0001       Neck Exercises: Machines for Strengthening   UBE (Upper Arm Bike) level 4.5 x 3 minutes each way    Cybex Row 45# 2 x 15    Lat Pull 45# 2 x 15      Neck Exercises: Theraband   Shoulder External Rotation 20 reps;Green    Horizontal ABduction Green;20 reps      Manual Therapy   Manual Therapy Joint mobilization;Soft tissue mobilization;Passive ROM    Joint Mobilization Tspine PA and rotation grade III    Soft tissue mobilization to the cervical, upper trap and the SCM area    Passive ROM shoulder and neck                       PT Short Term Goals - 08/08/20 0935       PT SHORT TERM GOAL #1   Title Pt will be I and compliant with initial HEP.     Baseline prescribed at eval    Time 3    Period Weeks    Status Achieved    Target Date 07/22/20      PT SHORT TERM GOAL #2   Title FOTO will be taken with LTG created by visit 3.    Baseline 45% taken visit 2    Time 3    Period Weeks    Status Achieved    Target Date 07/22/20               PT Long Term Goals - 09/16/20 0920       PT LONG TERM GOAL #2   Title Pt will increase B cervical rotation to >/= 60, in order to easily turn head to check blindspots for safety in driving.    Status Partially Met      PT LONG  TERM GOAL #3   Title Pt will decrease max pain level to </= 3/10 with all I/ADLs.    Status Achieved                   Plan - 09/23/20 1056     Clinical Impression Statement Patient has great incresae in shoulder ROM and flexibility when measured and reports that he feels like it is easier to do things around the house, he does seem to have stress about caregiving for wife I did talk with him about finding respite care.  He is tight and had some c/o dizziness after the [facility]    PT Next Visit Plan may try the traction again    Consulted and Agree with Plan of Care Patient             Patient will benefit from skilled therapeutic intervention in order to improve the following deficits and impairments:  Pain, Postural dysfunction, Impaired flexibility, Increased fascial restricitons, Improper body mechanics, Impaired perceived functional ability, Decreased range of motion, Hypomobility  Visit Diagnosis: Cervicalgia  Abnormal posture     Problem List Patient Active Problem List   Diagnosis Date Noted   Chronic epididymitis 08/13/2020   Hives 07/11/2020   Rheumatoid arthritis ([place]) 07/11/2020   Osteoarthritis of left knee 07/11/2020   Cervical arthritis 07/11/2020   Gastroesophageal reflux disease 07/11/2020   Presbyopia of both eyes 04/20/2015   Pseudophakia of both eyes 04/20/2015    Sumner Boast, PT 09/23/2020, 11:04  AM  [place]. [place], [place], [address] Phone: [phone]   Fax:  [phone]  Name: Joshua Ruiz MRN: [id number] Date of Birth: December 11, 1940

## 2020-09-25 ENCOUNTER — Ambulatory Visit: Payer: Medicare PPO | Admitting: Rehabilitative and Restorative Service Providers"

## 2020-09-30 ENCOUNTER — Ambulatory Visit: Payer: Medicare PPO | Admitting: Physical Therapy

## 2020-09-30 ENCOUNTER — Encounter: Payer: Self-pay | Admitting: Physical Therapy

## 2020-09-30 ENCOUNTER — Other Ambulatory Visit: Payer: Self-pay

## 2020-09-30 DIAGNOSIS — M542 Cervicalgia: Secondary | ICD-10-CM

## 2020-09-30 DIAGNOSIS — R293 Abnormal posture: Secondary | ICD-10-CM

## 2020-09-30 NOTE — Therapy (Signed)
[place]. [place], [place], [address] Phone: [phone]   Fax:  [phone]  Physical Therapy Treatment  Patient Details  Name: Joshua Ruiz MRN: [id number] Date of Birth: 08-Mar-1940 Referring Provider (PT): Newman Nickels, MD   Encounter Date: 09/30/2020   PT End of Session - 09/30/20 1018     Visit Number 17    Date for PT Re-Evaluation 11/09/20    Authorization - Visit Number 0.33    PT Start Time 0929    PT Stop Time 1010    PT Time Calculation (min) 41 min    Activity Tolerance Patient tolerated treatment well    Behavior During Therapy [facility] for tasks assessed/performed             Past Medical History:  Diagnosis Date   GERD (gastroesophageal reflux disease)    Rheumatoid arthritis ([place])     Past Surgical History:  Procedure Laterality Date   HEMORRHOID SURGERY     LYMPH NODE BIOPSY     Neck   TRIGGER FINGER RELEASE Right    3rd    There were no vitals filed for this visit.   Subjective Assessment - 09/30/20 0932     Subjective I am okay, just the stiffness is what gets me    Currently in Pain? Yes    Pain Score 2     Pain Location Neck    Pain Orientation Right;Left    Pain Relieving Factors tretment helps some                               [facility] Adult PT Treatment/Exercise - 09/30/20 0001       Neck Exercises: Machines for Strengthening   UBE (Upper Arm Bike) level 4.5 x 3 minutes each way    Cybex Row 45# 2 x 15    Cybex Chest Press 10# 2x10 working on the strech of the chest    Lat Pull 45# 2 x 15      Manual Therapy   Manual Therapy Joint mobilization;Soft tissue mobilization;Passive ROM    Joint Mobilization Tspine PA and rotation grade III    Soft tissue mobilization to the cervical, upper trap and the SCM area    Passive ROM shoulder and neck, contract relax                       PT Short Term Goals - 08/08/20 0935        PT SHORT TERM GOAL #1   Title Pt will be I and compliant with initial HEP.    Baseline prescribed at eval    Time 3    Period Weeks    Status Achieved    Target Date 07/22/20      PT SHORT TERM GOAL #2   Title FOTO will be taken with LTG created by visit 3.    Baseline 45% taken visit 2    Time 3    Period Weeks    Status Achieved    Target Date 07/22/20               PT Long Term Goals - 09/30/20 1125       PT LONG TERM GOAL #2   Title Pt will increase B cervical rotation to >/= 60, in order to easily turn head to check blindspots for safety in driving.    Status Partially  Met                   Plan - 09/30/20 1019     Clinical Impression Statement Patient reports driving and backing up the car is a little better, still very tight especially rotating to the right and very tight with side bending, the UE ROM is much improved.  I focused on PROM and worked on some contract relax to try to gain more ROM    PT Next Visit Plan try the PROM    Consulted and Agree with Plan of Care Patient             Patient will benefit from skilled therapeutic intervention in order to improve the following deficits and impairments:  Pain, Postural dysfunction, Impaired flexibility, Increased fascial restricitons, Improper body mechanics, Impaired perceived functional ability, Decreased range of motion, Hypomobility  Visit Diagnosis: Cervicalgia  Abnormal posture     Problem List Patient Active Problem List   Diagnosis Date Noted   Chronic epididymitis 08/13/2020   Hives 07/11/2020   Rheumatoid arthritis ([place]) 07/11/2020   Osteoarthritis of left knee 07/11/2020   Cervical arthritis 07/11/2020   Gastroesophageal reflux disease 07/11/2020   Presbyopia of both eyes 04/20/2015   Pseudophakia of both eyes 04/20/2015    Sumner Boast, PT 09/30/2020, 11:26 AM  [place]. [place], [place],  [address] Phone: [phone]   Fax:  [phone]  Name: Haydon Dorris MRN: [id number] Date of Birth: 06-Aug-1940

## 2020-10-02 ENCOUNTER — Encounter: Payer: Self-pay | Admitting: Physical Therapy

## 2020-10-02 ENCOUNTER — Other Ambulatory Visit: Payer: Self-pay

## 2020-10-02 ENCOUNTER — Ambulatory Visit: Payer: Medicare PPO | Admitting: Physical Therapy

## 2020-10-02 DIAGNOSIS — R293 Abnormal posture: Secondary | ICD-10-CM

## 2020-10-02 DIAGNOSIS — M542 Cervicalgia: Secondary | ICD-10-CM | POA: Diagnosis not present

## 2020-10-02 NOTE — Therapy (Signed)
[place]. [place], [place], [address] Phone: [phone]   Fax:  [phone]  Physical Therapy Treatment  Patient Details  Name: Joshua Ruiz MRN: [id number] Date of Birth: [birth year] Referring Provider (PT): Newman Nickels, MD   Encounter Date: 10/02/2020   PT End of Session - 10/02/20 1012     Visit Number 18    Date for PT Re-Evaluation 11/02/20    Authorization Type [employer]    Activity Tolerance Patient tolerated treatment well    Behavior During Therapy [facility] for tasks assessed/performed             Past Medical History:  Diagnosis Date   GERD (gastroesophageal reflux disease)    Rheumatoid arthritis ([place])     Past Surgical History:  Procedure Laterality Date   HEMORRHOID SURGERY     LYMPH NODE BIOPSY     Neck   TRIGGER FINGER RELEASE Right    3rd    There were no vitals filed for this visit.   Subjective Assessment - 10/02/20 0930     Subjective I felt better afte the last treatment    Currently in Pain? Yes    Pain Score 2     Pain Location Neck                               [facility] Adult PT Treatment/Exercise - 10/02/20 0001       Neck Exercises: Theraband   Shoulder External Rotation 20 reps;Green    Horizontal ABduction Green;20 reps      Neck Exercises: Standing   Other Standing Exercises small red pball overhead ball taps to wall x 10.    Other Standing Exercises door way stretches  3 way - 30" x 2. UT stretch with 8# held in each hand 30" x 2      Electrical Stimulation   Electrical Stimulation Location cervical area    Electrical Stimulation Action IFC    Electrical Stimulation Parameters supine    Electrical Stimulation Goals Pain      Manual Therapy   Manual Therapy Joint mobilization;Soft tissue mobilization;Passive ROM    Soft tissue mobilization to the cervical, upper trap and the SCM area    Passive ROM shoulder and neck, contract  relax              Trigger Point Dry Needling - 10/02/20 0001     Consent Given? Yes    Education Handout Provided Yes    Muscles Treated Head and Neck Upper trapezius;Suboccipitals;Scalenes    Upper Trapezius Response Twitch reponse elicited;Palpable increased muscle length    Suboccipitals Response Twitch response elicited    Scalenes Response Twitch reponse elicited                     PT Short Term Goals - 08/08/20 0935       PT SHORT TERM GOAL #1   Title Pt will be I and compliant with initial HEP.    Baseline prescribed at eval    Time 3    Period Weeks    Status Achieved    Target Date 07/22/20      PT SHORT TERM GOAL #2   Title FOTO will be taken with LTG created by visit 3.    Baseline 45% taken visit 2    Time 3    Period Weeks    Status Achieved  Target Date 07/22/20               PT Long Term Goals - 10/02/20 1014       PT LONG TERM GOAL #1   Title Pt will be I with advanced HEP for maintenance of symptoms.    Status Partially Met      PT LONG TERM GOAL #2   Title Pt will increase B cervical rotation to >/= 60, in order to easily turn head to check blindspots for safety in driving.    Status Partially Met      PT LONG TERM GOAL #3   Title Pt will decrease max pain level to </= 3/10 with all I/ADLs.    Status Achieved      PT LONG TERM GOAL #4   Title FOTO increased to at least 58% to dmeo improved neck function    Status Achieved                   Plan - 10/02/20 1013     Clinical Impression Statement I tried the DN today in the upper traps and the right cervical area to see if this could loosen up the neck and allow better ROM, we are trying to find what we can do to help the ROM and stiffness.    PT Next Visit Plan see how the DN did and adjust as needed    Consulted and Agree with Plan of Care Patient             Patient will benefit from skilled therapeutic intervention in order to improve the  following deficits and impairments:  Pain, Postural dysfunction, Impaired flexibility, Increased fascial restricitons, Improper body mechanics, Impaired perceived functional ability, Decreased range of motion, Hypomobility  Visit Diagnosis: Cervicalgia  Abnormal posture     Problem List Patient Active Problem List   Diagnosis Date Noted   Chronic epididymitis 08/13/2020   Hives 07/11/2020   Rheumatoid arthritis ([place]) 07/11/2020   Osteoarthritis of left knee 07/11/2020   Cervical arthritis 07/11/2020   Gastroesophageal reflux disease 07/11/2020   Presbyopia of both eyes 04/20/2015   Pseudophakia of both eyes 04/20/2015    Sumner Boast, PT 10/02/2020, 10:15 AM  [place]. [place], [place], [address] Phone: [phone]   Fax:  [phone]  Name: Joshua Ruiz MRN: [id number] Date of Birth: [birth year]

## 2020-10-04 ENCOUNTER — Ambulatory Visit (INDEPENDENT_AMBULATORY_CARE_PROVIDER_SITE_OTHER): Payer: Medicare PPO | Admitting: *Deleted

## 2020-10-04 DIAGNOSIS — Z0000 Encounter for general adult medical examination without abnormal findings: Secondary | ICD-10-CM

## 2020-10-04 NOTE — Patient Instructions (Signed)
Health Maintenance, Male Adopting a healthy lifestyle and getting preventive care are important in promoting health and wellness. Ask your health care provider about: The right schedule for you to have regular tests and exams. Things you can do on your own to prevent diseases and keep yourself healthy. What should I know about diet, weight, and exercise? Eat a healthy diet  Eat a diet that includes plenty of vegetables, fruits, low-fat dairy products, and lean protein. Do not eat a lot of foods that are high in solid fats, added sugars, or sodium. Maintain a healthy weight Body mass index (BMI) is a measurement that can be used to identify possible weight problems. It estimates body fat based on height and weight. Your health care provider can help determine your BMI and help you achieve or maintain a healthy weight. Get regular exercise Get regular exercise. This is one of the most important things you can do for your health. Most adults should: Exercise for at least 150 minutes each week. The exercise should increase your heart rate and make you sweat (moderate-intensity exercise). Do strengthening exercises at least twice a week. This is in addition to the moderate-intensity exercise. Spend less time sitting. Even light physical activity can be beneficial. Watch cholesterol and blood lipids Have your blood tested for lipids and cholesterol at 80 years of age, then have this test every 5 years. You may need to have your cholesterol levels checked more often if: Your lipid or cholesterol levels are high. You are older than 80 years of age. You are at high risk for heart disease. What should I know about cancer screening? Many types of cancers can be detected early and may often be prevented. Depending on your health history and family history, you may need to have cancer screening at various ages. This may include screening for: Colorectal cancer. Prostate cancer. Skin cancer. Lung  cancer. What should I know about heart disease, diabetes, and high blood pressure? Blood pressure and heart disease High blood pressure causes heart disease and increases the risk of stroke. This is more likely to develop in people who have high blood pressure readings, are of African descent, or are overweight. Talk with your health care provider about your target blood pressure readings. Have your blood pressure checked: Every 3-5 years if you are 18-39 years of age. Every year if you are 40 years old or older. If you are between the ages of 65 and 75 and are a current or former smoker, ask your health care provider if you should have a one-time screening for abdominal aortic aneurysm (AAA). Diabetes Have regular diabetes screenings. This checks your fasting blood sugar level. Have the screening done: Once every three years after age 45 if you are at a normal weight and have a low risk for diabetes. More often and at a younger age if you are overweight or have a high risk for diabetes. What should I know about preventing infection? Hepatitis B If you have a higher risk for hepatitis B, you should be screened for this virus. Talk with your health care provider to find out if you are at risk for hepatitis B infection. Hepatitis C Blood testing is recommended for: Everyone born from 1945 through 1965. Anyone with known risk factors for hepatitis C. Sexually transmitted infections (STIs) You should be screened each year for STIs, including gonorrhea and chlamydia, if: You are sexually active and are younger than 80 years of age. You are older than 80 years   of age and your health care provider tells you that you are at risk for this type of infection. Your sexual activity has changed since you were last screened, and you are at increased risk for chlamydia or gonorrhea. Ask your health care provider if you are at risk. Ask your health care provider about whether you are at high risk for HIV.  Your health care provider may recommend a prescription medicine to help prevent HIV infection. If you choose to take medicine to prevent HIV, you should first get tested for HIV. You should then be tested every 3 months for as long as you are taking the medicine. Follow these instructions at home: Lifestyle Do not use any products that contain nicotine or tobacco, such as cigarettes, e-cigarettes, and chewing tobacco. If you need help quitting, ask your health care provider. Do not use street drugs. Do not share needles. Ask your health care provider for help if you need support or information about quitting drugs. Alcohol use Do not drink alcohol if your health care provider tells you not to drink. If you drink alcohol: Limit how much you have to 0-2 drinks a day. Be aware of how much alcohol is in your drink. In the [place] one drink equals one 12 oz bottle of beer (355 mL), one 5 oz glass of wine (148 mL), or one 1 oz glass of hard liquor (44 mL). General instructions Schedule regular health, dental, and eye exams. Stay current with your vaccines. Tell your health care provider if: You often feel depressed. You have ever been abused or do not feel safe at home. Summary Adopting a healthy lifestyle and getting preventive care are important in promoting health and wellness. Follow your health care provider's instructions about healthy diet, exercising, and getting tested or screened for diseases. Follow your health care provider's instructions on monitoring your cholesterol and blood pressure. This information is not intended to replace advice given to you by your health care provider. Make sure you discuss any questions you have with your health care provider. Document Revised: 03/07/2020 Document Reviewed: 12/21/2017 Elsevier Patient Education  2022 [employer]  

## 2020-10-04 NOTE — Progress Notes (Signed)
Subjective:   Joshua Ruiz is a 80 y.o. male who presents for an Initial Medicare Annual Wellness Visit.  I connected with  Gwynne Edinger on 10/04/20 by audio enabled telemedicine application and verified that I am speaking with the correct person using two identifiers.   I discussed the limitations of evaluation and management by telemedicine. The patient expressed understanding and agreed to proceed.   Location of Patient: Home Location of Provider: Home Office Persons participating in visit: Joshua Ruiz (patient) & Silvestre Moment, CMA   Review of Systems    Defer to PCP       Objective:    There were no vitals filed for this visit. There is no height or weight on file to calculate BMI.  Advanced Directives 10/04/2020 07/01/2020  Does Patient Have a Medical Advance Directive? Yes Yes  Type of Estate agent of [place];Living will Healthcare Power of [place];Out of facility DNR (pink MOST or yellow form);Living will  Copy of Healthcare Power of Attorney in Chart? No - copy requested -    Current Medications (verified) Outpatient Encounter Medications as of 10/04/2020  Medication Sig   cetirizine (ZYRTEC) 10 MG tablet Take by mouth.   folic acid (FOLVITE) 1 MG tablet Take by mouth.   hydroxychloroquine (PLAQUENIL) 200 MG tablet Take by mouth.   loratadine (CLARITIN) 10 MG tablet Take by mouth.   methotrexate (RHEUMATREX) 2.5 MG tablet Take 2.5 mg by mouth once a week. Caution:Chemotherapy. Protect from light.   Multiple Vitamin (MULTI-VITAMIN) tablet Take by mouth.   omeprazole (PRILOSEC) 20 MG capsule Take by mouth.   No facility-administered encounter medications on file as of 10/04/2020.    Allergies (verified) Patient has no known allergies.   History: Past Medical History:  Diagnosis Date   GERD (gastroesophageal reflux disease)    Rheumatoid arthritis (HCC)    Past Surgical History:  Procedure Laterality Date   HEMORRHOID SURGERY      LYMPH NODE BIOPSY     Neck   TRIGGER FINGER RELEASE Right    3rd   Family History  Problem Relation Age of Onset   Rheum arthritis Mother    Diabetes Mother    Cancer Sister        Lung   Cancer Brother        Lung   Social History   Socioeconomic History   Marital status: Married    Spouse name: Not on file   Number of children: 1   Years of education: Not on file   Highest education level: Not on file  Occupational History   Not on file  Tobacco Use   Smoking status: Never    Passive exposure: Never   Smokeless tobacco: Never  Vaping Use   Vaping Use: Never used  Substance and Sexual Activity   Alcohol use: Not Currently    Comment: none   Drug use: Never   Sexual activity: Yes  Other Topics Concern   Not on file  Social History Narrative   Retired- Haematologist   Social Determinants of Corporate investment banker Strain: Low Risk    Difficulty of Paying Living Expenses: Not hard at all  Food Insecurity: No Food Insecurity   Worried About Programme researcher, broadcasting/film/video in the Last Year: Never true   Barista in the Last Year: Never true  Transportation Needs: No Transportation Needs   Lack of Transportation (Medical): No   Lack of Transportation (Non-Medical): No  Physical  Activity: Insufficiently Active   Days of Exercise per Week: 5 days   Minutes of Exercise per Session: 20 min  Stress: No Stress Concern Present   Feeling of Stress : Not at all  Social Connections: Moderately Isolated   Frequency of Communication with Friends and Family: Three times a week   Frequency of Social Gatherings with Friends and Family: Once a week   Attends Religious Services: Never   Database administrator or Organizations: No   Attends Engineer, structural: Never   Marital Status: Married    Tobacco Counseling Counseling given: Not Answered   Clinical Intake:     Pain : No/denies pain     BMI - recorded: 29.44 Nutritional Status: BMI 25 -29  Overweight Diabetes: No  How often do you need to have someone help you when you read instructions, pamphlets, or other written materials from your doctor or pharmacy?: 1 - Never  Diabetic?No  Interpreter Needed?: No      Activities of Daily Living In your present state of health, do you have any difficulty performing the following activities: 10/04/2020  Hearing? Y  Vision? N  Difficulty concentrating or making decisions? N  Walking or climbing stairs? N  Comment does have some weakness in left knee  Dressing or bathing? N  Doing errands, shopping? N  Preparing Food and eating ? N  Using the Toilet? N  In the past six months, have you accidently leaked urine? N  Do you have problems with loss of bowel control? N  Managing your Medications? N  Managing your Finances? N  Housekeeping or managing your Housekeeping? N  Some recent data might be hidden    Patient Care Team: Loyola Mast, MD as PCP - General (Family Medicine) Prince Rome, Casimiro Needle, MD (Sports Medicine)  Indicate any recent Medical Services you may have received from other than [facility] providers in the past year (date may be approximate).     Assessment:   This is a routine wellness examination for [place].  Hearing/Vision screen No results found.  Dietary issues and exercise activities discussed: Current Exercise Habits: Home exercise routine, Type of exercise: strength training/weights;stretching, Time (Minutes): 20, Frequency (Times/Week): 5, Weekly Exercise (Minutes/Week): 100, Intensity: Mild, Exercise limited by: None identified   Goals Addressed   None    Depression Screen PHQ 2/9 Scores 10/04/2020 07/11/2020 07/11/2020  PHQ - 2 Score 0 0 0  PHQ- 9 Score - 0 0    Fall Risk Fall Risk  10/04/2020 07/11/2020  Falls in the past year? 0 0  Number falls in past yr: 0 0  Injury with Fall? 0 0    FALL RISK PREVENTION PERTAINING TO THE HOME:  Any stairs in or around the home? No  If so, are there any without  handrails?  N/A Home free of loose throw rugs in walkways, pet beds, electrical cords, etc? Yes  Adequate lighting in your home to reduce risk of falls? Yes   ASSISTIVE DEVICES UTILIZED TO PREVENT FALLS:  Life alert? No  Use of a cane, walker or w/c? No  Grab bars in the bathroom? Yes  Shower chair or bench in shower? No  Elevated toilet seat or a handicapped toilet? No   TIMED UP AND GO:  Was the test performed?  n/a .  Length of time to ambulate 10 feet: n/a sec.     Cognitive Function:     6CIT Screen 10/04/2020  What Year? 0 points  What month?  0 points  What time? 0 points  Count back from 20 0 points  Months in reverse 0 points  Repeat phrase 4 points  Total Score 4    Immunizations Immunization History  Administered Date(s) Administered   PFIZER(Purple Top)SARS-COV-2 Vaccination 02/25/2019, 03/20/2019   Pfizer Covid-19 Vaccine Bivalent Booster 4yrs & up 11/29/2019    TDAP status: Due, Education has been provided regarding the importance of this vaccine. Advised may receive this vaccine at local pharmacy or Health Dept. Aware to provide a copy of the vaccination record if obtained from local pharmacy or Health Dept. Verbalized acceptance and understanding.  Flu Vaccine status: Declined, Education has been provided regarding the importance of this vaccine but patient still declined. Advised may receive this vaccine at local pharmacy or Health Dept. Aware to provide a copy of the vaccination record if obtained from local pharmacy or Health Dept. Verbalized acceptance and understanding.  Pneumococcal vaccine status: Due, Education has been provided regarding the importance of this vaccine. Advised may receive this vaccine at local pharmacy or Health Dept. Aware to provide a copy of the vaccination record if obtained from local pharmacy or Health Dept. Verbalized acceptance and understanding.  Covid-19 vaccine status: Information provided on how to obtain vaccines.    Qualifies for Shingles Vaccine? Yes   Zostavax completed No   Shingrix Completed?: No.    Education has been provided regarding the importance of this vaccine. Patient has been advised to call insurance company to determine out of pocket expense if they have not yet received this vaccine. Advised may also receive vaccine at local pharmacy or Health Dept. Verbalized acceptance and understanding.  Screening Tests Health Maintenance  Topic Date Due   COVID-19 Vaccine (3 - Pfizer risk series) 10/20/2020 (Originally 04/17/2019)   Zoster Vaccines- Shingrix (1 of 2) 01/03/2021 (Originally 01/17/1959)   TETANUS/TDAP  10/04/2021 (Originally 01/17/1959)   HPV VACCINES  Aged Out   INFLUENZA VACCINE  Discontinued    Health Maintenance  There are no preventive care reminders to display for this patient.   Colorectal cancer screening: No longer required.   Lung Cancer Screening: (Low Dose CT Chest recommended if Age 33-80 years, 30 pack-year currently smoking OR have quit w/in 15years.) does not qualify.     Additional Screening:  Hepatitis C Screening: does not qualify; Completed   Vision Screening: Recommended annual ophthalmology exams for early detection of glaucoma and other disorders of the eye. Is the patient up to date with their annual eye exam?  Yes  Who is the provider or what is the name of the office in which the patient attends annual eye exams?  Dr. Logan Bores If pt is not established with a provider, would they like to be referred to a provider to establish care? No .   Dental Screening: Recommended annual dental exams for proper oral hygiene  Community Resource Referral / Chronic Care Management: CRR required this visit?  No   CCM required this visit?  No      Plan:     I have personally reviewed and noted the following in the patient's chart:   Medical and social history Use of alcohol, tobacco or illicit drugs  Current medications and supplements including opioid  prescriptions. Patient is not currently taking opioid prescriptions. Functional ability and status Nutritional status Physical activity Advanced directives List of other physicians Hospitalizations, surgeries, and ER visits in previous 12 months Vitals Screenings to include cognitive, depression, and falls Referrals and appointments  In addition, I  have reviewed and discussed with patient certain preventive protocols, quality metrics, and best practice recommendations. A written personalized care plan for preventive services as well as general preventive health recommendations were provided to patient.     Thurmond Butts, CMA   10/04/2020   Nurse Notes: 12 minutes non face to face   Mr. Spickler , Thank you for taking time to come for your Medicare Wellness Visit. I appreciate your ongoing commitment to your health goals. Please review the following plan we discussed and let me know if I can assist you in the future.   These are the goals we discussed:  Goals   None     This is a list of the screening recommended for you and due dates:  Health Maintenance  Topic Date Due   COVID-19 Vaccine (3 - Pfizer risk series) 10/20/2020*   Zoster (Shingles) Vaccine (1 of 2) 01/03/2021*   Tetanus Vaccine  10/04/2021*   HPV Vaccine  Aged Out   Flu Shot  Discontinued  *Topic was postponed. The date shown is not the original due date.

## 2020-10-07 ENCOUNTER — Other Ambulatory Visit: Payer: Self-pay

## 2020-10-07 ENCOUNTER — Encounter: Payer: Self-pay | Admitting: Physical Therapy

## 2020-10-07 ENCOUNTER — Ambulatory Visit: Payer: Medicare PPO | Admitting: Physical Therapy

## 2020-10-07 DIAGNOSIS — M542 Cervicalgia: Secondary | ICD-10-CM

## 2020-10-07 DIAGNOSIS — R293 Abnormal posture: Secondary | ICD-10-CM

## 2020-10-07 NOTE — Therapy (Signed)
Joshua Ruiz. [place], [place], [address] Phone: [phone]   Fax:  [phone]  Physical Therapy Treatment  Patient Details  Name: Joshua Ruiz MRN: [id number] Date of Birth: [birth year] Referring Provider (PT): Newman Nickels, MD   Encounter Date: 10/07/2020   PT End of Session - 10/07/20 1205     Visit Number 19    Date for PT Re-Evaluation 11/02/20    Authorization Type [employer] Medicare    PT Start Time 0928    PT Stop Time 1013    PT Time Calculation (min) 45 min    Activity Tolerance Patient tolerated treatment well    Behavior During Therapy [facility] for tasks assessed/performed             Past Medical History:  Diagnosis Date   GERD (gastroesophageal reflux disease)    Rheumatoid arthritis ([place])     Past Surgical History:  Procedure Laterality Date   HEMORRHOID SURGERY     LYMPH NODE BIOPSY     Neck   TRIGGER FINGER RELEASE Right    3rd    There were no vitals filed for this visit.   Subjective Assessment - 10/07/20 0928     Subjective I was sore for a while but I seemed to have less issues overall with comfort and tightness, not much change with ROM, I am having some right hip pain today any time I get up after sitting    Currently in Pain? Yes    Pain Score 4     Pain Location Hip    Pain Orientation Right    Pain Descriptors / Indicators Sore                               [facility] Adult PT Treatment/Exercise - 10/07/20 0001       Ambulation/Gait   Gait Comments gait around the back building with some head turns as we walk      Neck Exercises: Supine   Other Supine Exercise feet on ball K2C, trunk  rotation, small bridge, isometric abs all with holding light cervical retraction      Manual Therapy   Manual Therapy Joint mobilization;Soft tissue mobilization;Passive ROM    Soft tissue mobilization to the cervical, upper trap and the SCM area    Passive ROM  shoulder and neck, contract relax to gain ROM    Manual Traction occipital release                       PT Short Term Goals - 08/08/20 0935       PT SHORT TERM GOAL #1   Title Pt will be I and compliant with initial HEP.    Baseline prescribed at eval    Time 3    Period Weeks    Status Achieved    Target Date 07/22/20      PT SHORT TERM GOAL #2   Title FOTO will be taken with LTG created by visit 3.    Baseline 45% taken visit 2    Time 3    Period Weeks    Status Achieved    Target Date 07/22/20               PT Long Term Goals - 10/02/20 1014       PT LONG TERM GOAL #1   Title Pt will be I with advanced  HEP for maintenance of symptoms.    Status Partially Met      PT LONG TERM GOAL #2   Title Pt will increase B cervical rotation to >/= 60, in order to easily turn head to check blindspots for safety in driving.    Status Partially Met      PT LONG TERM GOAL #3   Title Pt will decrease max pain level to </= 3/10 with all I/ADLs.    Status Achieved      PT LONG TERM GOAL #4   Title FOTO increased to at least 58% to dmeo improved neck function    Status Achieved                   Plan - 10/07/20 1206     Clinical Impression Statement I really worked on PROM of the cervical spine, he has a great deal of difficulty relaxing, I gave a lot of cues to help but still unable, His shoulder ROM is greatly improved but the neck remains very stiff    PT Next Visit Plan work on the stiffness is we make no changes we may d/c    Consulted and Agree with Plan of Care Patient             Patient will benefit from skilled therapeutic intervention in order to improve the following deficits and impairments:  Pain, Postural dysfunction, Impaired flexibility, Increased fascial restricitons, Improper body mechanics, Impaired perceived functional ability, Decreased range of motion, Hypomobility  Visit Diagnosis: Cervicalgia  Abnormal  posture     Problem List Patient Active Problem List   Diagnosis Date Noted   Chronic epididymitis 08/13/2020   Hives 07/11/2020   Rheumatoid arthritis ([place]) 07/11/2020   Osteoarthritis of left knee 07/11/2020   Cervical arthritis 07/11/2020   Gastroesophageal reflux disease 07/11/2020   Presbyopia of both eyes 04/20/2015   Pseudophakia of both eyes 04/20/2015    Joshua Ruiz, PT 10/07/2020, 12:08 PM  . [place], [place], [address] Phone: [phone]   Fax:  [phone]  Name: Joshua Ruiz MRN: [id number] Date of Birth: [birth year]

## 2020-10-09 ENCOUNTER — Other Ambulatory Visit: Payer: Self-pay

## 2020-10-09 ENCOUNTER — Encounter: Payer: Self-pay | Admitting: Physical Therapy

## 2020-10-09 ENCOUNTER — Ambulatory Visit: Payer: Medicare PPO | Admitting: Physical Therapy

## 2020-10-09 DIAGNOSIS — R293 Abnormal posture: Secondary | ICD-10-CM

## 2020-10-09 DIAGNOSIS — M542 Cervicalgia: Secondary | ICD-10-CM

## 2020-10-09 NOTE — Therapy (Signed)
[place]. [place], [place], [address] Phone: [phone]   Fax:  [phone] Progress Note Reporting Period 08/19/20 to 10/09/20 for visit 11-20  See note below for Objective Data and Assessment of Progress/Goals.     Physical Therapy Treatment  Patient Details  Name: Joshua Ruiz MRN: [id number] Date of Birth: 04/01/1940 Referring Provider (PT): Newman Nickels, MD   Encounter Date: 10/09/2020   PT End of Session - 10/09/20 1002     Visit Number 20    Date for PT Re-Evaluation 11/02/20    Authorization Type [employer] Medicare    PT Start Time 0926    PT Stop Time 8588    PT Time Calculation (min) 48 min    Activity Tolerance Patient tolerated treatment well    Behavior During Therapy [facility] for tasks assessed/performed             Past Medical History:  Diagnosis Date   GERD (gastroesophageal reflux disease)    Rheumatoid arthritis ([place])     Past Surgical History:  Procedure Laterality Date   HEMORRHOID SURGERY     LYMPH NODE BIOPSY     Neck   TRIGGER FINGER RELEASE Right    3rd    There were no vitals filed for this visit.   Subjective Assessment - 10/09/20 0930     Subjective Reports that his wife fell this AM, he tried to help her up, ended up calling EMS, he reports a lot of stress this AM    Currently in Pain? Yes    Pain Score 3     Pain Location Neck    Pain Descriptors / Indicators Spasm;Tightness    Aggravating Factors  stress, with wife                               [facility] Adult PT Treatment/Exercise - 10/09/20 0001       Neck Exercises: Machines for Strengthening   UBE (Upper Arm Bike) level 4.5 x 3 minutes each way    Cybex Row 35# 2 x 15    Cybex Chest Press 10# 2x12 working on the strech of the chest    Lat Pull 35# 2x15      Neck Exercises: Theraband   Horizontal ABduction Green;20 reps      Acupuncturist  Location cervical area    Electrical Stimulation Action IFC    Electrical Stimulation Parameters supine    Electrical Stimulation Goals Pain      Manual Therapy   Manual Therapy Joint mobilization;Soft tissue mobilization;Passive ROM    Soft tissue mobilization to the cervical, upper trap and the SCM area    Passive ROM shoulder and neck, contract relax to gain ROM    Manual Traction occipital release                       PT Short Term Goals - 08/08/20 0935       PT SHORT TERM GOAL #1   Title Pt will be I and compliant with initial HEP.    Baseline prescribed at eval    Time 3    Period Weeks    Status Achieved    Target Date 07/22/20      PT SHORT TERM GOAL #2   Title FOTO will be taken with LTG created by visit 3.    Baseline 45%  taken visit 2    Time 3    Period Weeks    Status Achieved    Target Date 07/22/20               PT Long Term Goals - 10/09/20 1003       PT LONG TERM GOAL #1   Title Pt will be I with advanced HEP for maintenance of symptoms.    Status Partially Met      PT LONG TERM GOAL #2   Title Pt will increase B cervical rotation to >/= 60, in order to easily turn head to check blindspots for safety in driving.    Status Partially Met                   Plan - 10/09/20 1002     Clinical Impression Statement Patient had increased stress and activity this AM, his wife fell and he could not get her up had to call EMS, comes in with stress and tightness and some incresae of pain, neck ROM was very limited today with any side bending and right rotation, could gain minimal ROM with PROM of the head, SCM's were in spasm    PT Next Visit Plan work on the stiffness is we make no changes we may d/c    Consulted and Agree with Plan of Care Patient             Patient will benefit from skilled therapeutic intervention in order to improve the following deficits and impairments:  Pain, Postural dysfunction, Impaired  flexibility, Increased fascial restricitons, Improper body mechanics, Impaired perceived functional ability, Decreased range of motion, Hypomobility  Visit Diagnosis: Cervicalgia  Abnormal posture     Problem List Patient Active Problem List   Diagnosis Date Noted   Chronic epididymitis 08/13/2020   Hives 07/11/2020   Rheumatoid arthritis ([place]) 07/11/2020   Osteoarthritis of left knee 07/11/2020   Cervical arthritis 07/11/2020   Gastroesophageal reflux disease 07/11/2020   Presbyopia of both eyes 04/20/2015   Pseudophakia of both eyes 04/20/2015    Sumner Boast, PT 10/09/2020, 10:04 AM  [place]. [place], [place], [address] Phone: [phone]   Fax:  [phone]  Name: Lj Miyamoto MRN: [id number] Date of Birth: February 27, 1940

## 2020-10-15 ENCOUNTER — Encounter: Payer: Self-pay | Admitting: Physical Therapy

## 2020-10-15 ENCOUNTER — Ambulatory Visit: Payer: Medicare PPO | Attending: Rheumatology | Admitting: Physical Therapy

## 2020-10-15 ENCOUNTER — Other Ambulatory Visit: Payer: Self-pay

## 2020-10-15 DIAGNOSIS — R293 Abnormal posture: Secondary | ICD-10-CM | POA: Diagnosis present

## 2020-10-15 DIAGNOSIS — M542 Cervicalgia: Secondary | ICD-10-CM | POA: Diagnosis not present

## 2020-10-15 NOTE — Therapy (Signed)
[place]. [place], [place], [address] Phone: [phone]   Fax:  [phone]  Physical Therapy Treatment  Patient Details  Name: Joshua Ruiz MRN: [id number] Date of Birth: 1940-07-13 Referring Provider (PT): Newman Nickels, MD   Encounter Date: 10/15/2020   PT End of Session - 10/15/20 1012     Visit Number 21    Date for PT Re-Evaluation 11/02/20    Authorization Type [employer] Medicare    PT Start Time 0929    PT Stop Time 1013    PT Time Calculation (min) 44 min    Activity Tolerance Patient tolerated treatment well    Behavior During Therapy [facility] for tasks assessed/performed             Past Medical History:  Diagnosis Date   GERD (gastroesophageal reflux disease)    Rheumatoid arthritis ([place])     Past Surgical History:  Procedure Laterality Date   HEMORRHOID SURGERY     LYMPH NODE BIOPSY     Neck   TRIGGER FINGER RELEASE Right    3rd    There were no vitals filed for this visit.   Subjective Assessment - 10/15/20 0943     Subjective Reports wife fell again and he had to call EMS again, again the stess and trying to help increased hiss pain and his feeling of stiffness    Currently in Pain? Yes    Pain Score 3     Pain Location Neck    Pain Orientation Right    Aggravating Factors  stress                               [facility] Adult PT Treatment/Exercise - 10/15/20 0001       Neck Exercises: Machines for Strengthening   UBE (Upper Arm Bike) level 4.5 x 3 minutes each way    Cybex Row 35# 2 x 15    Cybex Chest Press 10# 2x12 working on the strech of the chest    Lat Pull 35# 2x15    Other Machines for Strengthening 10# straight arm pulls      Shoulder Exercises: Seated   Other Seated Exercises 8# bent over row, 5# extension and reverse flies      Shoulder Exercises: Standing   External Rotation 20 reps;Weights    External Rotation Weight (lbs) 3#     External Rotation Limitations arms at 90 degrees abduction    Other Standing Exercises 3# facing wall overhead flexionoff the wall    Other Standing Exercises blue tband horizontal abduction      Manual Therapy   Manual Therapy Joint mobilization;Soft tissue mobilization;Passive ROM    Soft tissue mobilization to the cervical, upper trap and the SCM area    Passive ROM shoulder and neck, contract relax to gain ROM                       PT Short Term Goals - 08/08/20 0935       PT SHORT TERM GOAL #1   Title Pt will be I and compliant with initial HEP.    Baseline prescribed at eval    Time 3    Period Weeks    Status Achieved    Target Date 07/22/20      PT SHORT TERM GOAL #2   Title FOTO will be taken with LTG created by visit  3.    Baseline 45% taken visit 2    Time 3    Period Weeks    Status Achieved    Target Date 07/22/20               PT Long Term Goals - 10/09/20 1003       PT LONG TERM GOAL #1   Title Pt will be I with advanced HEP for maintenance of symptoms.    Status Partially Met      PT LONG TERM GOAL #2   Title Pt will increase B cervical rotation to >/= 60, in order to easily turn head to check blindspots for safety in driving.    Status Partially Met                   Plan - 10/15/20 1014     Clinical Impression Statement Patient's spouse fall again, I gave education on throw rugs, night lights and suggested bed side commode.  He gets really stiff and has decreased ROM after these episodes of stress and less sleep.  Had very tight cervical motions today, slight increase in pain.    PT Next Visit Plan work on the stiffness is we make no changes we may d/c    Consulted and Agree with Plan of Care Patient             Patient will benefit from skilled therapeutic intervention in order to improve the following deficits and impairments:  Pain, Postural dysfunction, Impaired flexibility, Increased fascial restricitons,  Improper body mechanics, Impaired perceived functional ability, Decreased range of motion, Hypomobility  Visit Diagnosis: Cervicalgia  Abnormal posture     Problem List Patient Active Problem List   Diagnosis Date Noted   Chronic epididymitis 08/13/2020   Hives 07/11/2020   Rheumatoid arthritis ([place]) 07/11/2020   Osteoarthritis of left knee 07/11/2020   Cervical arthritis 07/11/2020   Gastroesophageal reflux disease 07/11/2020   Presbyopia of both eyes 04/20/2015   Pseudophakia of both eyes 04/20/2015    Sumner Boast, PT 10/15/2020, 10:15 AM  [place]. [place], [place], [address] Phone: [phone]   Fax:  [phone]  Name: Joshua Ruiz MRN: [id number] Date of Birth: [birth year]

## 2020-10-22 ENCOUNTER — Ambulatory Visit: Payer: Medicare PPO | Admitting: Physical Therapy

## 2020-10-23 ENCOUNTER — Ambulatory Visit: Payer: Medicare PPO | Admitting: Physical Therapy

## 2020-10-23 ENCOUNTER — Other Ambulatory Visit: Payer: Self-pay

## 2020-10-23 ENCOUNTER — Encounter: Payer: Self-pay | Admitting: Physical Therapy

## 2020-10-23 DIAGNOSIS — R293 Abnormal posture: Secondary | ICD-10-CM

## 2020-10-23 DIAGNOSIS — M542 Cervicalgia: Secondary | ICD-10-CM

## 2020-10-23 NOTE — Therapy (Signed)
[place]. [place], [place], [address] Phone: [phone]   Fax:  [phone]  Physical Therapy Treatment  Patient Details  Name: Joshua Ruiz MRN: [id number] Date of Birth: 06-06-1940 Referring Provider (PT): Newman Nickels, MD   Encounter Date: 10/23/2020   PT End of Session - 10/23/20 1017     Visit Number 22    Date for PT Re-Evaluation 11/02/20    Authorization Type [employer]    PT Start Time 0927    PT Stop Time 1013    PT Time Calculation (min) 46 min    Activity Tolerance Patient tolerated treatment well    Behavior During Therapy [facility] for tasks assessed/performed             Past Medical History:  Diagnosis Date   GERD (gastroesophageal reflux disease)    Rheumatoid arthritis ([place])     Past Surgical History:  Procedure Laterality Date   HEMORRHOID SURGERY     LYMPH NODE BIOPSY     Neck   TRIGGER FINGER RELEASE Right    3rd    There were no vitals filed for this visit.   Subjective Assessment - 10/23/20 0930     Subjective I think we got my wife safer with the walker and her waking up husband prior to getting up in the night    Currently in Pain? Yes    Pain Score 2     Pain Location Neck    Pain Orientation Right    Pain Descriptors / Indicators Tightness    Aggravating Factors  stress                               [facility] Adult PT Treatment/Exercise - 10/23/20 0001       Neck Exercises: Machines for Strengthening   Nustep level 5 x 6 minutes    Lat Pull 35# 2x15    Other Machines for Strengthening 10# straight arm pulls    Other Machines for Strengthening 5# cross over pulls for upper back      Shoulder Exercises: Seated   Other Seated Exercises 8# bent over row, 5# extension and reverse flies      Shoulder Exercises: Standing   External Rotation 20 reps;Weights    External Rotation Weight (lbs) 3#    External Rotation Limitations arms at  90 degrees abduction    Other Standing Exercises 3# facing wall overhead flexionoff the wall, AR press with tband    Other Standing Exercises blue tband horizontal abduction      Manual Therapy   Manual Therapy Joint mobilization;Soft tissue mobilization;Passive ROM    Soft tissue mobilization to the cervical, upper trap and the SCM area    Passive ROM shoulder and neck, contract relax to gain ROM              Trigger Point Dry Needling - 10/23/20 0001     Consent Given? Yes    Suboccipitals Response Twitch response elicited    Scalenes Response Twitch reponse elicited                     PT Short Term Goals - 08/08/20 0935       PT SHORT TERM GOAL #1   Title Pt will be I and compliant with initial HEP.    Baseline prescribed at eval    Time 3    Period  Weeks    Status Achieved    Target Date 07/22/20      PT SHORT TERM GOAL #2   Title FOTO will be taken with LTG created by visit 3.    Baseline 45% taken visit 2    Time 3    Period Weeks    Status Achieved    Target Date 07/22/20               PT Long Term Goals - 10/23/20 1019       PT LONG TERM GOAL #1   Title Pt will be I with advanced HEP for maintenance of symptoms.    Status Partially Met      PT LONG TERM GOAL #2   Title Pt will increase B cervical rotation to >/= 60, in order to easily turn head to check blindspots for safety in driving.      PT LONG TERM GOAL #3   Title Pt will decrease max pain level to </= 3/10 with all I/ADLs.    Status Achieved      PT LONG TERM GOAL #4   Title FOTO increased to at least 58% to dmeo improved neck function    Status Achieved                   Plan - 10/23/20 1018     Clinical Impression Statement Patient has made great improvment in the shoulder ROM, he still has significant tightness and decresaed cervical ROM, we have tried many different things to help.  I tried the needles again to see if this would help as he said he thought  it helped a few days, biggest issue is not much carryover    PT Next Visit Plan will more than likely d/c at next visit with assurance that he has things that he can do at home to help with his tightness    Consulted and Agree with Plan of Care Patient             Patient will benefit from skilled therapeutic intervention in order to improve the following deficits and impairments:  Pain, Postural dysfunction, Impaired flexibility, Increased fascial restricitons, Improper body mechanics, Impaired perceived functional ability, Decreased range of motion, Hypomobility  Visit Diagnosis: Cervicalgia  Abnormal posture     Problem List Patient Active Problem List   Diagnosis Date Noted   Chronic epididymitis 08/13/2020   Hives 07/11/2020   Rheumatoid arthritis ([place]) 07/11/2020   Osteoarthritis of left knee 07/11/2020   Cervical arthritis 07/11/2020   Gastroesophageal reflux disease 07/11/2020   Presbyopia of both eyes 04/20/2015   Pseudophakia of both eyes 04/20/2015    Sumner Boast, PT 10/23/2020, 10:28 AM  [place]. [place], [place], [address] Phone: [phone]   Fax:  [phone]  Name: Joshua Ruiz MRN: [id number] Date of Birth: [birth year]

## 2020-10-28 ENCOUNTER — Other Ambulatory Visit: Payer: Self-pay

## 2020-10-28 ENCOUNTER — Encounter: Payer: Self-pay | Admitting: Physical Therapy

## 2020-10-28 ENCOUNTER — Ambulatory Visit: Payer: Medicare PPO | Admitting: Physical Therapy

## 2020-10-28 DIAGNOSIS — M542 Cervicalgia: Secondary | ICD-10-CM | POA: Diagnosis not present

## 2020-10-28 DIAGNOSIS — R293 Abnormal posture: Secondary | ICD-10-CM

## 2020-10-28 NOTE — Therapy (Signed)
[place]. [place], [place], [address] Phone: [phone]   Fax:  [phone]  Physical Therapy Treatment  Patient Details  Name: Joshua Ruiz MRN: [id number] Date of Birth: [birth year] Referring Provider (PT): Newman Nickels, MD   Encounter Date: 10/28/2020   PT End of Session - 10/28/20 1010     Visit Number 23    Date for PT Re-Evaluation 11/02/20    PT Start Time 0925    PT Stop Time 1010    PT Time Calculation (min) 45 min    Activity Tolerance Patient tolerated treatment well    Behavior During Therapy [facility] for tasks assessed/performed             Past Medical History:  Diagnosis Date   GERD (gastroesophageal reflux disease)    Rheumatoid arthritis ([place])     Past Surgical History:  Procedure Laterality Date   HEMORRHOID SURGERY     LYMPH NODE BIOPSY     Neck   TRIGGER FINGER RELEASE Right    3rd    There were no vitals filed for this visit.   Subjective Assessment - 10/28/20 0934     Subjective Doing okay still just stiff    Pain Location Neck    Pain Descriptors / Indicators Tightness;Spasm                               [facility] Adult PT Treatment/Exercise - 10/28/20 0001       Neck Exercises: Machines for Strengthening   UBE (Upper Arm Bike) level 4.5 x 3 minutes each way    Cybex Row 45# 2 x 15    Cybex Chest Press 15# 2x15    Lat Pull 45# 2x15      Neck Exercises: Supine   Neck Retraction 20 reps;3 secs    Neck Retraction Limitations head on ball      Neck Exercises: Stretches   Corner Stretch 3 reps;10 seconds    Corner Stretch Limitations in doorway      Shoulder Exercises: Standing   External Rotation 20 reps;Weights    External Rotation Weight (lbs) 3#    External Rotation Limitations arms at 90 degrees abduction    Other Standing Exercises 3# facing wall overhead flexionoff the wall    Other Standing Exercises blue tband horizontal  abduction, 6# stick overhead      Manual Therapy   Manual Therapy Passive ROM    Passive ROM cervical rotation and side bending                       PT Short Term Goals - 08/08/20 0935       PT SHORT TERM GOAL #1   Title Pt will be I and compliant with initial HEP.    Baseline prescribed at eval    Time 3    Period Weeks    Status Achieved    Target Date 07/22/20      PT SHORT TERM GOAL #2   Title FOTO will be taken with LTG created by visit 3.    Baseline 45% taken visit 2    Time 3    Period Weeks    Status Achieved    Target Date 07/22/20               PT Long Term Goals - 10/23/20 1019       PT  LONG TERM GOAL #1   Title Pt will be I with advanced HEP for maintenance of symptoms.    Status Partially Met      PT LONG TERM GOAL #2   Title Pt will increase B cervical rotation to >/= 60, in order to easily turn head to check blindspots for safety in driving.      PT LONG TERM GOAL #3   Title Pt will decrease max pain level to </= 3/10 with all I/ADLs.    Status Achieved      PT LONG TERM GOAL #4   Title FOTO increased to at least 58% to dmeo improved neck function    Status Achieved                   Plan - 10/28/20 1010     Clinical Impression Statement added cervical retraction onthe bal and overhead carries, He remains very tight in the cervical motions, with PROM he improves, the shoulders have been what made the most improvement, the cervical ROM does not seem to carry over, he has had less pain since we started and I feel that he is thinking aobut his posture    PT Next Visit Plan nect visit will d/c, review all HEP and gym exercises and answer his questions    Consulted and Agree with Plan of Care Patient             Patient will benefit from skilled therapeutic intervention in order to improve the following deficits and impairments:  Pain, Postural dysfunction, Impaired flexibility, Increased fascial restricitons,  Improper body mechanics, Impaired perceived functional ability, Decreased range of motion, Hypomobility  Visit Diagnosis: Cervicalgia  Abnormal posture     Problem List Patient Active Problem List   Diagnosis Date Noted   Chronic epididymitis 08/13/2020   Hives 07/11/2020   Rheumatoid arthritis ([place]) 07/11/2020   Osteoarthritis of left knee 07/11/2020   Cervical arthritis 07/11/2020   Gastroesophageal reflux disease 07/11/2020   Presbyopia of both eyes 04/20/2015   Pseudophakia of both eyes 04/20/2015    Sumner Boast, PT 10/28/2020, 10:12 AM  [place]. [place], [place], [address] Phone: [phone]   Fax:  [phone]  Name: Zebulun Deman MRN: [id number] Date of Birth: Nov 30, 1940

## 2020-10-30 ENCOUNTER — Other Ambulatory Visit: Payer: Self-pay

## 2020-10-30 ENCOUNTER — Ambulatory Visit: Payer: Medicare PPO | Admitting: Physical Therapy

## 2020-10-30 DIAGNOSIS — R293 Abnormal posture: Secondary | ICD-10-CM

## 2020-10-30 DIAGNOSIS — M542 Cervicalgia: Secondary | ICD-10-CM

## 2020-10-30 NOTE — Therapy (Signed)
[place]. [place], [place], [address] Phone: [phone]   Fax:  [phone]  Physical Therapy Treatment  Patient Details  Name: Gearld Kerstein MRN: [id number] Date of Birth: 1940-04-28 Referring Provider (PT): Newman Nickels, MD   Encounter Date: 10/30/2020   PT End of Session - 10/30/20 0928     Visit Number 24    Date for PT Re-Evaluation 11/02/20    PT Start Time 0925    PT Stop Time 1005    PT Time Calculation (min) 40 min             Past Medical History:  Diagnosis Date   GERD (gastroesophageal reflux disease)    Rheumatoid arthritis ([place])     Past Surgical History:  Procedure Laterality Date   HEMORRHOID SURGERY     LYMPH NODE BIOPSY     Neck   TRIGGER FINGER RELEASE Right    3rd    There were no vitals filed for this visit.   Subjective Assessment - 10/30/20 0926     Subjective doing okay, stiff but I think it will always be there    Currently in Pain? No/denies                               [facility] Adult PT Treatment/Exercise - 10/30/20 0001       Neck Exercises: Machines for Strengthening   UBE (Upper Arm Bike) level 4.5 x 3 minutes each way    Cybex Row 45# 2 x 15    Cybex Chest Press 15# 2x15    Lat Pull 45# 2x15    Other Machines for Strengthening 10# straight arm pulls    Other Machines for Strengthening 5# cross over pulls for upper back   5# shld ext 2 sets 10     Neck Exercises: Standing   Neck Retraction 15 reps   head on ball     Shoulder Exercises: Standing   Other Standing Exercises 3# facing wall overhead flexionoff the wall    Other Standing Exercises blue tband horizontal abduction, diagonals      Manual Therapy   Manual Therapy Passive ROM    Passive ROM cervical rotation and side bending   pects and shlds and traps                      PT Short Term Goals - 08/08/20 0935       PT SHORT TERM GOAL #1   Title Pt  will be I and compliant with initial HEP.    Baseline prescribed at eval    Time 3    Period Weeks    Status Achieved    Target Date 07/22/20      PT SHORT TERM GOAL #2   Title FOTO will be taken with LTG created by visit 3.    Baseline 45% taken visit 2    Time 3    Period Weeks    Status Achieved    Target Date 07/22/20               PT Long Term Goals - 10/30/20 0927       PT LONG TERM GOAL #1   Title Pt will be I with advanced HEP for maintenance of symptoms.    Status Achieved      PT LONG TERM GOAL #2   Title Pt will increase  B cervical rotation to >/= 60, in order to easily turn head to check blindspots for safety in driving.    Baseline so better but ROM varies per pt report    Status Partially Met      PT LONG TERM GOAL #3   Title Pt will decrease max pain level to </= 3/10 with all I/ADLs.    Status Achieved                   Plan - 10/30/20 0929     Clinical Impression Statement all goals met except cerv ROM is still tight, loosens with PROM and stretching but does not always maintain so still issues with ROM and driving with blind spots. pt feels good with HEP and maintaince at home.    PT Treatment/Interventions Spinal Manipulations;Joint Manipulations;ADLs/Self Care Home Management;Cryotherapy;Electrical Stimulation;Iontophoresis 50m/ml Dexamethasone;Moist Heat;Traction;Therapeutic activities;Therapeutic exercise;Neuromuscular re-education;Patient/family education;Manual techniques;Passive range of motion;Dry needling;Taping    PT Next Visit Plan D/C             Patient will benefit from skilled therapeutic intervention in order to improve the following deficits and impairments:  Pain, Postural dysfunction, Impaired flexibility, Increased fascial restricitons, Improper body mechanics, Impaired perceived functional ability, Decreased range of motion, Hypomobility  Visit Diagnosis: Cervicalgia  Abnormal posture     Problem  List Patient Active Problem List   Diagnosis Date Noted   Chronic epididymitis 08/13/2020   Hives 07/11/2020   [place] 07/11/2020   Osteoarthritis of left knee 07/11/2020   Cervical arthritis 07/11/2020   Gastroesophageal reflux disease 07/11/2020   Presbyopia of both eyes 04/20/2015   Pseudophakia of both eyes 04/20/2015   PHYSICAL THERAPY DISCHARGE SUMMARY  Patient agrees to discharge. Patient goals were met. Patient is being discharged due to meeting the stated rehab goals.  Rosell Khouri,ANGIE, PTA 10/30/2020, 10:06 AM  [place] [address]Phone: [phone]  Fax:  [phone] Name: SOciel RetherfordMRN: [id number] of Birth: 123-Dec-1942

## 2020-11-28 ENCOUNTER — Ambulatory Visit: Payer: Medicare PPO | Admitting: Orthopaedic Surgery

## 2020-11-28 ENCOUNTER — Other Ambulatory Visit: Payer: Self-pay

## 2020-11-28 ENCOUNTER — Ambulatory Visit (INDEPENDENT_AMBULATORY_CARE_PROVIDER_SITE_OTHER): Payer: Medicare PPO

## 2020-11-28 ENCOUNTER — Encounter: Payer: Self-pay | Admitting: Orthopaedic Surgery

## 2020-11-28 ENCOUNTER — Telehealth: Payer: Self-pay

## 2020-11-28 DIAGNOSIS — M545 Low back pain, unspecified: Secondary | ICD-10-CM

## 2020-11-28 DIAGNOSIS — M25551 Pain in right hip: Secondary | ICD-10-CM

## 2020-11-28 DIAGNOSIS — M1712 Unilateral primary osteoarthritis, left knee: Secondary | ICD-10-CM

## 2020-11-28 MED ORDER — PREDNISONE 10 MG (21) PO TBPK: ORAL_TABLET | ORAL | 0 refills | Status: DC

## 2020-11-28 MED ORDER — DICLOFENAC SODIUM 75 MG PO TBEC: 75.0000 mg | DELAYED_RELEASE_TABLET | Freq: Two times a day (BID) | ORAL | 2 refills | Status: DC | PRN

## 2020-11-28 NOTE — Progress Notes (Signed)
Office Visit Note   Patient: Joshua Ruiz           Date of Birth: [birth year]           MRN: [id number] Visit Date: 11/28/2020              Requested by: Loyola Mast, MD [address] [place],  [place] [address] PCP: Loyola Mast, MD   Assessment & Plan: Visit Diagnoses:  1. Unilateral primary osteoarthritis, left knee   2. Low back pain, unspecified back pain laterality, unspecified chronicity, unspecified whether sciatica present   3. Pain in right hip     Plan: Impression is right hip pain referred from the lumbar spine and left knee degenerative joint disease.  In regards to his right hip pain, we have discussed starting him on a steroid followed by a course of anti-inflammatories.  We have also discussed formal physical therapy but he would like to hold off for now.  If his symptoms have not improved with medications and continued guided home exercise program he will let us know we will order an MRI of the lumbar spine.  In regards to the knee, he has had good relief from viscosupplementation injections in the past so we will go ahead and get approval for repeat injection.  He is not interested in getting cortisone in the meantime.  Follow-up with [place] once approved.  Call with concerns or questions.  This patient is diagnosed with osteoarthritis of the knee(s).    Radiographs show evidence of joint space narrowing, osteophytes, subchondral sclerosis and/or subchondral cysts.  This patient has knee pain which interferes with functional and activities of daily living.    This patient has experienced inadequate response, adverse effects and/or intolerance with conservative treatments such as acetaminophen, NSAIDS, topical creams, physical therapy or regular exercise, knee bracing and/or weight loss.   This patient has experienced inadequate response or has a contraindication to intra articular steroid injections for at least 3 months.   This patient is not scheduled  to have a total knee replacement within 6 months of starting treatment with viscosupplementation.   Follow-Up Instructions: Return for f/u once approved for left knee visco inj.   Orders:  Orders Placed This Encounter  Procedures   XR KNEE 3 VIEW LEFT   XR HIP UNILAT W OR W/O PELVIS 2-3 VIEWS RIGHT   XR Lumbar Spine 2-3 Views   Meds ordered this encounter  Medications   diclofenac (VOLTAREN) 75 MG EC tablet    Sig: Take 1 tablet (75 mg total) by mouth 2 (two) times daily as needed. May start taking AFTER finished with the steroid taper    Dispense:  60 tablet    Refill:  2   predniSONE (STERAPRED UNI-PAK 21 TAB) 10 MG (21) TBPK tablet    Sig: Take as directed.  Do not take with any nsaids    Dispense:  21 tablet    Refill:  0      Procedures: No procedures performed   Clinical Data: No additional findings.   Subjective: Chief Complaint  Patient presents with   Right Knee - Pain   Right Hip - Pain   Lower Back - Pain    HPI patient is a pleasant 80 year old male who comes in today with right hip and left knee pain.  Right hip pain is been ongoing for the past month.  The pain he has is to the lateral hip and radiates into the buttock.  He denies any pain that radiates into anterior thigh or groin.  Pain is worse first thing in the morning as well as towards the end of the day.  He has been taking Tylenol without significant relief.  He denies any paresthesias to the right lower extremity.  Does note a history of back pain in consistently works on a guided home exercise program.  This has not provided any relief over the past month.  In regards to the left knee, he has had pain for years.  He is seeing Dr. Prince Rome in the past where viscosupplementation injection was performed with great relief.  The last gel injection was about 2 years ago which helped until recently.  The pain has returned and is primarily located the medial aspect feet.  No mechanical symptoms.  Pain is worse  with inclines.  He has been taking Tylenol without relief.  He would like a repeat viscosupplementation injection if possible.  Review of Systems as detailed in HPI.  All others reviewed and are negative.   Objective: Vital Signs: There were no vitals taken for this visit.  Physical Exam well-developed well-nourished gentleman in no acute distress.  Alert and oriented x3.  Ortho Exam examination of the right hip reveals negative logroll and negative FADIR.  No tenderness to the greater trochanter.  Negative straight leg raise.  He does have increased pain with lumbar flexion.  Mild pain with lumbar extension.  No spinous or paraspinous tenderness.  No focal weakness.  He is neurovascular intact distally.  Specialty Comments:  No specialty comments available.  Imaging: No results found.   PMFS History: Patient Active Problem List   Diagnosis Date Noted   Chronic epididymitis 08/13/2020   Hives 07/11/2020   Rheumatoid arthritis ([facility]) 07/11/2020   Osteoarthritis of left knee 07/11/2020   Cervical arthritis 07/11/2020   Gastroesophageal reflux disease 07/11/2020   Presbyopia of both eyes 04/20/2015   Pseudophakia of both eyes 04/20/2015   Past Medical History:  Diagnosis Date   GERD (gastroesophageal reflux disease)    Rheumatoid arthritis ([facility])     Family History  Problem Relation Age of Onset   Rheum arthritis Mother    Diabetes Mother    Cancer Sister        Lung   Cancer Brother        Lung    Past Surgical History:  Procedure Laterality Date   HEMORRHOID SURGERY     LYMPH NODE BIOPSY     Neck   TRIGGER FINGER RELEASE Right    3rd   Social History   Occupational History   Not on file  Tobacco Use   Smoking status: Never    Passive exposure: Never   Smokeless tobacco: Never  Vaping Use   Vaping Use: Never used  Substance and Sexual Activity   Alcohol use: Not Currently    Comment: none   Drug use: Never   Sexual activity: Yes

## 2020-11-28 NOTE — Telephone Encounter (Signed)
Please precert for left knee gel injection. This is Xu's patient. Thanks!

## 2020-11-28 NOTE — Telephone Encounter (Signed)
Noted  

## 2021-01-06 ENCOUNTER — Telehealth: Payer: Self-pay

## 2021-01-06 DIAGNOSIS — M545 Low back pain, unspecified: Secondary | ICD-10-CM

## 2021-01-06 NOTE — Telephone Encounter (Signed)
See message.

## 2021-01-06 NOTE — Telephone Encounter (Signed)
Can you order lumbar spine mri?

## 2021-01-06 NOTE — Telephone Encounter (Signed)
Pt called into the office stating that the medication isn't working and would like to set up an Mri.   Please advise

## 2021-01-06 NOTE — Telephone Encounter (Signed)
Order entered

## 2021-01-09 ENCOUNTER — Other Ambulatory Visit: Payer: Self-pay

## 2021-01-13 ENCOUNTER — Encounter: Payer: Self-pay | Admitting: Family Medicine

## 2021-01-13 ENCOUNTER — Other Ambulatory Visit: Payer: Self-pay

## 2021-01-13 ENCOUNTER — Ambulatory Visit: Payer: Medicare PPO | Admitting: Family Medicine

## 2021-01-13 VITALS — BP 134/78 | HR 94 | Temp 97.1°F | Ht 68.0 in | Wt 194.6 lb

## 2021-01-13 DIAGNOSIS — M545 Low back pain, unspecified: Secondary | ICD-10-CM

## 2021-01-13 DIAGNOSIS — Z23 Encounter for immunization: Secondary | ICD-10-CM

## 2021-01-13 DIAGNOSIS — N451 Epididymitis: Secondary | ICD-10-CM | POA: Diagnosis not present

## 2021-01-13 DIAGNOSIS — M069 Rheumatoid arthritis, unspecified: Secondary | ICD-10-CM | POA: Diagnosis not present

## 2021-01-13 DIAGNOSIS — M1712 Unilateral primary osteoarthritis, left knee: Secondary | ICD-10-CM

## 2021-01-13 DIAGNOSIS — G8929 Other chronic pain: Secondary | ICD-10-CM

## 2021-01-13 MED ORDER — PREDNISONE 10 MG (21) PO TBPK: ORAL_TABLET | ORAL | 0 refills | Status: DC

## 2021-01-13 NOTE — Progress Notes (Signed)
[facility] [address] [place] [address] Dept: [phone] Dept Fax: [phone]  Chronic Care Office Visit  Subjective:    Patient ID: Joshua Ruiz, male    DOB: 07-Dec-1940, 81 y.o..   MRN: [id number]  Chief Complaint  Patient presents with   Follow-up    6 month f/u.  Fasting today.  Declines flu shots. C/o having hip/back pain, has seen ortho.     History of Present Illness:  Patient is in today for reassessment of chronic medical issues.  Joshua Ruiz has a history of rheumatoid arthritis. He is managed on MTX and Plaquenil. He continues to follow with rheumatology and notes he is having labs every 2 months.  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 He notes this remains stable.  Joshua Ruiz was seen by orthopedics in Nov. related to left knee, right hip and lower back pain. He states he was told his hip pain is likely referred form his lower back issues. He was prescribed diclofenac. He was also prescribed a steroid, but he never picked that up. He has not noted the NSAID to have significantly improved his symptoms. He will be following up with orthopedics. They plan an MRI scan.  Past Medical History: Patient Active Problem List   Diagnosis Date Noted   Chronic epididymitis 08/13/2020   Hives 07/11/2020   Rheumatoid arthritis (HCC) 07/11/2020   Osteoarthritis of left knee 07/11/2020   Cervical arthritis 07/11/2020   Gastroesophageal reflux disease 07/11/2020   Presbyopia of both eyes 04/20/2015   Pseudophakia of both eyes 04/20/2015   Past Surgical History:  Procedure Laterality Date   HEMORRHOID SURGERY     LYMPH NODE BIOPSY     Neck   TRIGGER FINGER RELEASE Right    3rd   Family History  Problem Relation Age of Onset   Rheum arthritis Mother    Diabetes Mother    Cancer Sister        Lung   Cancer Brother        Lung   Outpatient Medications Prior to Visit  Medication  Sig Dispense Refill   cetirizine (ZYRTEC) 10 MG tablet Take by mouth.     diclofenac (VOLTAREN) 75 MG EC tablet Take 1 tablet (75 mg total) by mouth 2 (two) times daily as needed. May start taking AFTER finished with the steroid taper 60 tablet 2   folic acid (FOLVITE) 1 MG tablet Take by mouth.     hydroxychloroquine (PLAQUENIL) 200 MG tablet Take by mouth.     loratadine (CLARITIN) 10 MG tablet Take by mouth.     methotrexate (RHEUMATREX) 2.5 MG tablet Take 2.5 mg by mouth once a week. Caution:Chemotherapy. Protect from light.     Multiple Vitamin (MULTI-VITAMIN) tablet Take by mouth.     omeprazole (PRILOSEC) 20 MG capsule Take by mouth.     predniSONE (STERAPRED UNI-PAK 21 TAB) 10 MG (21) TBPK tablet Take as directed.  Do not take with any nsaids 21 tablet 0   No facility-administered medications prior to visit.   No Known Allergies    Objective:   Today's Vitals   01/13/21 0856  BP: 134/78  Pulse: 94  Temp: (!) 97.1 F (36.2 C)  TempSrc: Temporal  SpO2: 100%  Weight: 194 lb 9.6 oz (88.3 kg)  Height:  (1.727 m)   Body mass index is 29.59 kg/m.   General: Well developed, well nourished. No acute distress. Psych: Alert and oriented. Normal  mood and affect.  Health Maintenance Due  Topic Date Due   Pneumonia Vaccine 17+ Years old (1 - PCV) Never done   Zoster Vaccines- Shingrix (1 of 2) Never done   COVID-19 Vaccine (4 - Booster for Pfizer series) 01/24/2020   Imaging: Right Knee x-ray (11/29/2020) IMPRESSION:  Bone-on-bone medial compartment joint space narrowing.  Spurring of the patellofemoral compartment.  Overall findings consistent with degenerative joint disease.  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  Bilateral cam deformities.  Lumbar Spine x-ray (11/29/2020) IMPRESSION:  Multilevel degenerative disc disease with anterior vertebral spurring.   Grade 1 L4-5 spondylolisthesis.  Assessment & Plan:    1. Rheumatoid arthritis, involving unspecified site, unspecified whether rheumatoid factor present (HCC) Stable. Continue methotrexate and hydroxychlorquine. Rheumatology to follow labs.  2. Chronic epididymitis Stable. Patient managing.  3. Primary osteoarthritis of left knee Stable. Orthopedist planning viscosupplementation injection.  4. Chronic low back pain, unspecified back pain laterality, unspecified whether sciatica present Reviewed orthopedic notes. MRI scheduled. As Joshua Ruiz never had the course of prednisone prescribed, I will reorder this.  - predniSONE (STERAPRED UNI-PAK 21 TAB) 10 MG (21) TBPK tablet; Take as directed.  Do not take with any nsaids  Dispense: 21 tablet; Refill: 0  Loyola Mast, MD

## 2021-01-31 ENCOUNTER — Other Ambulatory Visit: Payer: Self-pay

## 2021-01-31 ENCOUNTER — Ambulatory Visit
Admission: RE | Admit: 2021-01-31 | Discharge: 2021-01-31 | Disposition: A | Discharge status: Home / Self Care | Payer: Medicare PPO | Source: Ambulatory Visit | Attending: Physician Assistant | Admitting: Physician Assistant

## 2021-01-31 DIAGNOSIS — M545 Low back pain, unspecified: Secondary | ICD-10-CM

## 2021-02-02 NOTE — Progress Notes (Signed)
F/u to discuss

## 2021-02-03 ENCOUNTER — Encounter: Payer: Self-pay | Admitting: Orthopaedic Surgery

## 2021-02-03 ENCOUNTER — Ambulatory Visit: Payer: Medicare PPO | Admitting: Orthopaedic Surgery

## 2021-02-03 ENCOUNTER — Other Ambulatory Visit: Payer: Self-pay

## 2021-02-03 DIAGNOSIS — M5441 Lumbago with sciatica, right side: Secondary | ICD-10-CM | POA: Diagnosis not present

## 2021-02-03 DIAGNOSIS — G8929 Other chronic pain: Secondary | ICD-10-CM | POA: Diagnosis not present

## 2021-02-03 NOTE — Progress Notes (Signed)
Office Visit Note   Patient: Joshua Ruiz           Date of Birth: 01/21/1940           MRN: [id number] Visit Date: 02/03/2021              Requested by: Loyola Mastudd, Stephen M, MD [address] [place],  [place] [id number] PCP: Loyola Mastudd, Stephen M, MD   Assessment & Plan: Visit Diagnoses:  1. Chronic bilateral low back pain with right-sided sciatica     Plan: Impression is severe canal stenosis L2-3, L3-4 and L4-5, moderate to severe foraminal narrowing L4-5, moderate foraminal narrowing L2-3 and L3-4 in addition to moderate left and mild right subarticular and foraminal narrowing L5-S1.  Today, we discussed treatment options to include repeat steroid, [facility] in addition to formal physical therapy.  He does not feel his pain is significant enough to repeat the steroid pack at this point but would like referral to Dr. Alvester MorinNewton for epidural steroid injection.  He would rather continue with his home exercise program and attend formal physical therapy.  He will follow-up with us as needed.  Follow-Up Instructions: Return if symptoms worsen or fail to improve.   Orders:  No orders of the defined types were placed in this encounter.  No orders of the defined types were placed in this encounter.     Procedures: No procedures performed   Clinical Data: No additional findings.   Subjective: Chief Complaint  Patient presents with   Lower Back - Pain    HPI patient is a pleasant 81 year old gentleman who comes in today to review MRI results of the lumbar spine.  He has been dealing with chronic low back pain and right sided hip pain for many months.  He was seen in our office a few months ago for this where he was started on a steroid taper followed by anti-inflammatories.  He notes that this did somewhat help but did not relieve his symptoms.  He has also worked on a home exercise program without relief.  Subsequent MRI of the lumbar spine from 02/01/2021 shows  severe canal stenosis L2-3, L3-4 and L4-5, moderate to severe foraminal narrowing L4-5, moderate foraminal narrowing L2-3 and L3-4 in addition to moderate left and mild right subarticular and foraminal narrowing L5-S1.     Objective: Vital Signs: There were no vitals taken for this visit.    Ortho Exam unchanged lumbar spine exam  Specialty Comments:  No specialty comments available.  Imaging: No new imaging   PMFS History: Patient Active Problem List   Diagnosis Date Noted   Chronic epididymitis 08/13/2020   Hives 07/11/2020   Rheumatoid arthritis (HCC) 07/11/2020   Osteoarthritis of left knee 07/11/2020   Cervical arthritis 07/11/2020   Gastroesophageal reflux disease 07/11/2020   Presbyopia of both eyes 04/20/2015   Pseudophakia of both eyes 04/20/2015   Past Medical History:  Diagnosis Date   GERD (gastroesophageal reflux disease)    Rheumatoid arthritis (HCC)     Family History  Problem Relation Age of Onset   Rheum arthritis Mother    Diabetes Mother    Cancer Sister        Lung   Cancer Brother        Lung    Past Surgical History:  Procedure Laterality Date   HEMORRHOID SURGERY     LYMPH NODE BIOPSY     Neck   TRIGGER FINGER RELEASE Right  3rd   Social History   Occupational History   Not on file  Tobacco Use   Smoking status: Never    Passive exposure: Never   Smokeless tobacco: Never  Vaping Use   Vaping Use: Never used  Substance and Sexual Activity   Alcohol use: Not Currently    Comment: none   Drug use: Never   Sexual activity: Yes

## 2021-02-19 ENCOUNTER — Ambulatory Visit: Payer: Self-pay

## 2021-02-19 ENCOUNTER — Encounter: Payer: Self-pay | Admitting: Physical Medicine and Rehabilitation

## 2021-02-19 ENCOUNTER — Other Ambulatory Visit: Payer: Self-pay

## 2021-02-19 ENCOUNTER — Ambulatory Visit (INDEPENDENT_AMBULATORY_CARE_PROVIDER_SITE_OTHER): Payer: Medicare PPO | Admitting: Physical Medicine and Rehabilitation

## 2021-02-19 VITALS — BP 174/76 | HR 60

## 2021-02-19 DIAGNOSIS — M48062 Spinal stenosis, lumbar region with neurogenic claudication: Secondary | ICD-10-CM | POA: Diagnosis not present

## 2021-02-19 DIAGNOSIS — M5116 Intervertebral disc disorders with radiculopathy, lumbar region: Secondary | ICD-10-CM

## 2021-02-19 DIAGNOSIS — M5416 Radiculopathy, lumbar region: Secondary | ICD-10-CM | POA: Diagnosis not present

## 2021-02-19 MED ORDER — METHYLPREDNISOLONE ACETATE 80 MG/ML IJ SUSP
80.0000 mg | Freq: Once | INTRAMUSCULAR | Status: AC
  Administered 2021-02-19: 80 mg

## 2021-02-19 NOTE — Progress Notes (Signed)
Pt state lower back pain that travels to his right hip and thigh. Pt state bending and standing makes the pain worse. Pt state he takes over the counter pain meds to help ease his pain.  Numeric Pain Rating Scale and Functional Assessment Average Pain 3   In the last MONTH (on 0-10 scale) has pain interfered with the following?  1. General activity like being  able to carry out your everyday physical activities such as walking, climbing stairs, carrying groceries, or moving a chair?  Rating(8)   +Driver, -BT, -Dye Allergies.

## 2021-02-19 NOTE — Patient Instructions (Signed)

## 2021-02-23 ENCOUNTER — Other Ambulatory Visit: Payer: Self-pay | Admitting: Physician Assistant

## 2021-02-24 NOTE — Progress Notes (Signed)
Joshua Ruiz - 81 y.o. male MRN [id number]  Date of birth: 1940-04-11  Office Visit Note: Visit Date: 02/19/2021 PCP: Loyola Mast, MD Referred by: Loyola Mast, MD  Subjective: Chief Complaint  Patient presents with   Lower Back - Pain   Right Hip - Pain   Right Thigh - Pain   HPI:  Joshua Ruiz is a 81 y.o. male who comes in today at the request of Dr. Glee Arvin for planned Right L4-5 Lumbar Interlaminar epidural steroid injection with fluoroscopic guidance.  The patient has failed conservative care including home exercise, medications, time and activity modification.  This injection will be diagnostic and hopefully therapeutic.  Please see requesting physician notes for further details and justification.  Fairly severe multilevel lumbar stenosis from L2-3 down to L4-5.  Symptoms more classic L5 and S1 symptoms.  Small disc protrusion there which could be the culprit.  Hopefully this is more of the disc retrusion he has been living with the stenosis.  Would consider transforaminal approach higher up.  ROS Otherwise per HPI.  Assessment & Plan: Visit Diagnoses:    ICD-10-CM   1. Lumbar radiculopathy  M54.16 XR C-ARM NO REPORT    Epidural Steroid injection    methylPREDNISolone acetate (DEPO-MEDROL) injection 80 mg    2. Spinal stenosis of lumbar region with neurogenic claudication  M48.062 XR C-ARM NO REPORT    Epidural Steroid injection    methylPREDNISolone acetate (DEPO-MEDROL) injection 80 mg    3. Radiculopathy due to lumbar intervertebral disc disorder  M51.16 XR C-ARM NO REPORT    Epidural Steroid injection    methylPREDNISolone acetate (DEPO-MEDROL) injection 80 mg      Plan: No additional findings.   Meds & Orders:  Meds ordered this encounter  Medications   methylPREDNISolone acetate (DEPO-MEDROL) injection 80 mg    Orders Placed This Encounter  Procedures   XR C-ARM NO REPORT   Epidural Steroid injection    Follow-up: Return in about 2 weeks  (around 03/05/2021).   Procedures: No procedures performed  Lumbar Epidural Steroid Injection - Interlaminar Approach with Fluoroscopic Guidance  Patient: Joshua Ruiz      Date of Birth: 14-Dec-1940 MRN: [id number] PCP: Loyola Mast, MD      Visit Date: 02/19/2021   Universal Protocol:     Consent Given By: the patient  Position: PRONE  Additional Comments: Vital signs were monitored before and after the procedure. Patient was prepped and draped in the usual sterile fashion. The correct patient, procedure, and site was verified.   Injection Procedure Details:   Procedure diagnoses: Lumbar radiculopathy [M54.16]   Meds Administered:  Meds ordered this encounter  Medications   methylPREDNISolone acetate (DEPO-MEDROL) injection 80 mg     Laterality: Right  Location/Site:  L5-S1  Needle: 3.5 in., 20 ga. Tuohy  Needle Placement: Paramedian epidural  Findings:   -Comments: Excellent flow of contrast into the epidural space.  Procedure Details: Using a paramedian approach from the side mentioned above, the region overlying the inferior lamina was localized under fluoroscopic visualization and the soft tissues overlying this structure were infiltrated with 4 ml. of 1% Lidocaine without Epinephrine. The Tuohy needle was inserted into the epidural space using a paramedian approach.   The epidural space was localized using loss of resistance along with counter oblique bi-planar fluoroscopic views.  After negative aspirate for air, blood, and CSF, a 2 ml. volume of Isovue-250 was injected into the epidural space and the flow of contrast  was observed. Radiographs were obtained for documentation purposes.    The injectate was administered into the level noted above.   Additional Comments:  No complications occurred Dressing: 2 x 2 sterile gauze and Band-Aid    Post-procedure details: Patient was observed during the procedure. Post-procedure instructions were  reviewed.  Patient left the clinic in stable condition.    Clinical History: MRI LUMBAR SPINE WITHOUT CONTRAST   TECHNIQUE: Multiplanar, multisequence MR imaging of the lumbar spine was performed. No intravenous contrast was administered.   COMPARISON:  Lumbar spine radiographs 11/28/2020   FINDINGS: Segmentation: 5 non rib-bearing lumbar type vertebral bodies are present. The lowest fully formed vertebral body is L5.   Alignment: Slight retrolisthesis is present at L1-2. Anterolisthesis L4-5 measures 6 mm. Both levels are less severe than on the standing radiographs. Mild leftward curvature is centered at L3-4.   Vertebrae: Chronic fatty endplate marrow changes surround Schmorl's nodes at L1-2. There is mild edema about Schmorl's nodes at L3-4. Marrow signal and vertebral body heights are otherwise normal.   Conus medullaris and cauda equina: Conus extends to the L1-2 level. Conus and cauda equina appear normal.   Paraspinal and other soft tissues: Limited imaging the abdomen is unremarkable. There is no significant adenopathy. No solid organ lesions are present.   Disc levels:   T12-L1: Negative.   L1-2: Broad-based disc protrusion is associated with the retrolisthesis. Mild facet hypertrophy is noted. Mild subarticular narrowing is worse on the left. Moderate foraminal narrowing is present bilaterally.   L2-3: A broad-based disc protrusion is present. Advanced facet hypertrophy contributes to severe central canal stenosis with crowding of the nerve roots fill. Moderate foraminal narrowing is present bilaterally.   L3-4: Nerve roots are somewhat tortuous between L2-3 and L3-4. A broad-based disc protrusion and advanced facet hypertrophy contribute to severe central canal stenosis at L3-4 with obliteration of CSF signal. Moderate foraminal narrowing is present bilaterally.   L4-5: Advanced facet hypertrophy is present. Uncovering of a broad-based disc protrusion  results in severe central canal stenosis no residual CSF signal remains. Moderate to severe foraminal narrowing is present bilaterally.   L5-S1: A central disc protrusion contacts the S1 nerve roots bilaterally, left greater than right. Moderate bilateral facet hypertrophy is noted. Moderate left and mild right foraminal narrowing is present.   IMPRESSION: 1. Severe central canal stenosis at L2-3, L3-4, and L4-5 with obliteration of CSF signal and crowding of the nerve roots. 2. Moderate to severe foraminal narrowing bilaterally at L4-5. 3. Moderate foraminal narrowing bilaterally at L2-3 and L3-4. 4. Moderate left and mild right subarticular and foraminal narrowing at L5-S1. 5. Mild subarticular and moderate foraminal narrowing bilaterally at L1-2.     Electronically Signed   By: Marin Roberts M.D.   On: 02/01/2021 07:00     Objective:  VS:  HT:     WT:    BMI:      BP:(!) 174/76   HR:60bpm   TEMP: ( )   RESP:  Physical Exam Vitals and nursing note reviewed.  Constitutional:      General: He is not in acute distress.    Appearance: Normal appearance. He is not ill-appearing.  HENT:     Head: Normocephalic and atraumatic.     Right Ear: External ear normal.     Left Ear: External ear normal.     Nose: No congestion.  Eyes:     Extraocular Movements: Extraocular movements intact.  Cardiovascular:     Rate and  Rhythm: Normal rate.     Pulses: Normal pulses.  Pulmonary:     Effort: Pulmonary effort is normal. No respiratory distress.  Abdominal:     General: There is no distension.     Palpations: Abdomen is soft.  Musculoskeletal:        General: No tenderness or signs of injury.     Cervical back: Neck supple.     Right lower leg: No edema.     Left lower leg: No edema.     Comments: Patient has good distal strength without clonus.  Skin:    Findings: No erythema or rash.  Neurological:     General: No focal deficit present.     Mental Status: He is  alert and oriented to person, place, and time.     Sensory: No sensory deficit.     Motor: No weakness or abnormal muscle tone.     Coordination: Coordination normal.  Psychiatric:        Mood and Affect: Mood normal.        Behavior: Behavior normal.     Imaging: No results found.

## 2021-02-24 NOTE — Procedures (Signed)
Lumbar Epidural Steroid Injection - Interlaminar Approach with Fluoroscopic Guidance  Patient: Joshua EdingerSidney Meech      Date of Birth: 11/25/1940 MRN: [id number] PCP: Loyola Mastudd, Stephen M, MD      Visit Date: 02/19/2021   Universal Protocol:     Consent Given By: the patient  Position: PRONE  Additional Comments: Vital signs were monitored before and after the procedure. Patient was prepped and draped in the usual sterile fashion. The correct patient, procedure, and site was verified.   Injection Procedure Details:   Procedure diagnoses: Lumbar radiculopathy [M54.16]   Meds Administered:  Meds ordered this encounter  Medications   methylPREDNISolone acetate (DEPO-MEDROL) injection 80 mg     Laterality: Right  Location/Site:  L5-S1  Needle: 3.5 in., 20 ga. Tuohy  Needle Placement: Paramedian epidural  Findings:   -Comments: Excellent flow of contrast into the epidural space.  Procedure Details: Using a paramedian approach from the side mentioned above, the region overlying the inferior lamina was localized under fluoroscopic visualization and the soft tissues overlying this structure were infiltrated with 4 ml. of 1% Lidocaine without Epinephrine. The Tuohy needle was inserted into the epidural space using a paramedian approach.   The epidural space was localized using loss of resistance along with counter oblique bi-planar fluoroscopic views.  After negative aspirate for air, blood, and CSF, a 2 ml. volume of Isovue-250 was injected into the epidural space and the flow of contrast was observed. Radiographs were obtained for documentation purposes.    The injectate was administered into the level noted above.   Additional Comments:  No complications occurred Dressing: 2 x 2 sterile gauze and Band-Aid    Post-procedure details: Patient was observed during the procedure. Post-procedure instructions were reviewed.  Patient left the clinic in stable condition.

## 2021-03-04 DIAGNOSIS — Z79899 Other long term (current) drug therapy: Secondary | ICD-10-CM | POA: Diagnosis not present

## 2021-05-13 DIAGNOSIS — Z79899 Other long term (current) drug therapy: Secondary | ICD-10-CM | POA: Diagnosis not present

## 2021-05-20 DIAGNOSIS — M47816 Spondylosis without myelopathy or radiculopathy, lumbar region: Secondary | ICD-10-CM | POA: Diagnosis not present

## 2021-05-20 DIAGNOSIS — M8949 Other hypertrophic osteoarthropathy, multiple sites: Secondary | ICD-10-CM | POA: Diagnosis not present

## 2021-05-20 DIAGNOSIS — M069 Rheumatoid arthritis, unspecified: Secondary | ICD-10-CM | POA: Diagnosis not present

## 2021-05-20 DIAGNOSIS — Z79899 Other long term (current) drug therapy: Secondary | ICD-10-CM | POA: Diagnosis not present

## 2021-07-07 ENCOUNTER — Telehealth: Payer: Self-pay | Admitting: Physical Medicine and Rehabilitation

## 2021-07-08 DIAGNOSIS — M069 Rheumatoid arthritis, unspecified: Secondary | ICD-10-CM | POA: Diagnosis not present

## 2021-07-08 DIAGNOSIS — Z79899 Other long term (current) drug therapy: Secondary | ICD-10-CM | POA: Diagnosis not present

## 2021-07-13 DIAGNOSIS — J309 Allergic rhinitis, unspecified: Secondary | ICD-10-CM | POA: Diagnosis not present

## 2021-07-13 DIAGNOSIS — Z1389 Encounter for screening for other disorder: Secondary | ICD-10-CM | POA: Diagnosis not present

## 2021-07-13 DIAGNOSIS — E785 Hyperlipidemia, unspecified: Secondary | ICD-10-CM | POA: Diagnosis not present

## 2021-07-13 DIAGNOSIS — Z0000 Encounter for general adult medical examination without abnormal findings: Secondary | ICD-10-CM | POA: Diagnosis not present

## 2021-07-13 DIAGNOSIS — Z0389 Encounter for observation for other suspected diseases and conditions ruled out: Secondary | ICD-10-CM | POA: Diagnosis not present

## 2021-07-13 DIAGNOSIS — K2100 Gastro-esophageal reflux disease with esophagitis, without bleeding: Secondary | ICD-10-CM | POA: Diagnosis not present

## 2021-07-13 DIAGNOSIS — D539 Nutritional anemia, unspecified: Secondary | ICD-10-CM | POA: Diagnosis not present

## 2021-07-13 DIAGNOSIS — R351 Nocturia: Secondary | ICD-10-CM | POA: Diagnosis not present

## 2021-07-13 DIAGNOSIS — M069 Rheumatoid arthritis, unspecified: Secondary | ICD-10-CM | POA: Diagnosis not present

## 2021-07-16 ENCOUNTER — Ambulatory Visit: Payer: Medicare PPO | Admitting: Family Medicine

## 2021-07-24 DIAGNOSIS — S233XXA Sprain of ligaments of thoracic spine, initial encounter: Secondary | ICD-10-CM | POA: Diagnosis not present

## 2021-07-24 DIAGNOSIS — M9903 Segmental and somatic dysfunction of lumbar region: Secondary | ICD-10-CM | POA: Diagnosis not present

## 2021-07-24 DIAGNOSIS — M9901 Segmental and somatic dysfunction of cervical region: Secondary | ICD-10-CM | POA: Diagnosis not present

## 2021-07-24 DIAGNOSIS — S138XXA Sprain of joints and ligaments of other parts of neck, initial encounter: Secondary | ICD-10-CM | POA: Diagnosis not present

## 2021-07-24 DIAGNOSIS — M9905 Segmental and somatic dysfunction of pelvic region: Secondary | ICD-10-CM | POA: Diagnosis not present

## 2021-07-24 DIAGNOSIS — M5137 Other intervertebral disc degeneration, lumbosacral region: Secondary | ICD-10-CM | POA: Diagnosis not present

## 2021-07-24 DIAGNOSIS — M9902 Segmental and somatic dysfunction of thoracic region: Secondary | ICD-10-CM | POA: Diagnosis not present

## 2021-07-24 DIAGNOSIS — S336XXA Sprain of sacroiliac joint, initial encounter: Secondary | ICD-10-CM | POA: Diagnosis not present

## 2021-07-27 DIAGNOSIS — M9903 Segmental and somatic dysfunction of lumbar region: Secondary | ICD-10-CM | POA: Diagnosis not present

## 2021-07-27 DIAGNOSIS — M9905 Segmental and somatic dysfunction of pelvic region: Secondary | ICD-10-CM | POA: Diagnosis not present

## 2021-07-27 DIAGNOSIS — M9901 Segmental and somatic dysfunction of cervical region: Secondary | ICD-10-CM | POA: Diagnosis not present

## 2021-07-27 DIAGNOSIS — S233XXA Sprain of ligaments of thoracic spine, initial encounter: Secondary | ICD-10-CM | POA: Diagnosis not present

## 2021-07-27 DIAGNOSIS — S138XXA Sprain of joints and ligaments of other parts of neck, initial encounter: Secondary | ICD-10-CM | POA: Diagnosis not present

## 2021-07-27 DIAGNOSIS — M5137 Other intervertebral disc degeneration, lumbosacral region: Secondary | ICD-10-CM | POA: Diagnosis not present

## 2021-07-27 DIAGNOSIS — S336XXA Sprain of sacroiliac joint, initial encounter: Secondary | ICD-10-CM | POA: Diagnosis not present

## 2021-07-27 DIAGNOSIS — M9902 Segmental and somatic dysfunction of thoracic region: Secondary | ICD-10-CM | POA: Diagnosis not present

## 2021-07-31 DIAGNOSIS — M9903 Segmental and somatic dysfunction of lumbar region: Secondary | ICD-10-CM | POA: Diagnosis not present

## 2021-07-31 DIAGNOSIS — M5137 Other intervertebral disc degeneration, lumbosacral region: Secondary | ICD-10-CM | POA: Diagnosis not present

## 2021-07-31 DIAGNOSIS — S233XXA Sprain of ligaments of thoracic spine, initial encounter: Secondary | ICD-10-CM | POA: Diagnosis not present

## 2021-07-31 DIAGNOSIS — M9905 Segmental and somatic dysfunction of pelvic region: Secondary | ICD-10-CM | POA: Diagnosis not present

## 2021-07-31 DIAGNOSIS — S138XXA Sprain of joints and ligaments of other parts of neck, initial encounter: Secondary | ICD-10-CM | POA: Diagnosis not present

## 2021-07-31 DIAGNOSIS — S336XXA Sprain of sacroiliac joint, initial encounter: Secondary | ICD-10-CM | POA: Diagnosis not present

## 2021-07-31 DIAGNOSIS — M9901 Segmental and somatic dysfunction of cervical region: Secondary | ICD-10-CM | POA: Diagnosis not present

## 2021-07-31 DIAGNOSIS — M9902 Segmental and somatic dysfunction of thoracic region: Secondary | ICD-10-CM | POA: Diagnosis not present

## 2021-08-12 ENCOUNTER — Ambulatory Visit: Payer: Self-pay

## 2021-08-12 ENCOUNTER — Ambulatory Visit (INDEPENDENT_AMBULATORY_CARE_PROVIDER_SITE_OTHER): Payer: Medicare PPO | Admitting: Physical Medicine and Rehabilitation

## 2021-08-12 VITALS — BP 150/84 | HR 62 | Ht 68.0 in | Wt 194.6 lb

## 2021-08-12 DIAGNOSIS — M5416 Radiculopathy, lumbar region: Secondary | ICD-10-CM

## 2021-08-12 MED ORDER — METHYLPREDNISOLONE ACETATE 80 MG/ML IJ SUSP
80.0000 mg | Freq: Once | INTRAMUSCULAR | Status: AC
  Administered 2021-08-12: 80 mg

## 2021-08-12 NOTE — Patient Instructions (Signed)

## 2021-08-12 NOTE — Progress Notes (Signed)
Numeric Pain Rating Scale and Functional Assessment Average Pain 3   In the last MONTH (on 0-10 scale) has pain interfered with the following?  1. General activity like being  able to carry out your everyday physical activities such as walking, climbing stairs, carrying groceries, or moving a chair?  Rating(3) Having pain in the left hip area. Last injection on the left really helped.  +Driver, -BT, -Dye Allergies.

## 2021-08-14 ENCOUNTER — Telehealth: Payer: Self-pay

## 2021-08-14 ENCOUNTER — Ambulatory Visit: Payer: Medicare PPO | Admitting: Orthopaedic Surgery

## 2021-08-14 DIAGNOSIS — M1712 Unilateral primary osteoarthritis, left knee: Secondary | ICD-10-CM | POA: Diagnosis not present

## 2021-08-14 NOTE — Telephone Encounter (Signed)
Left knee visco approval. Dr.Xu's patient.  

## 2021-08-14 NOTE — Progress Notes (Signed)
   Office Visit Note   Patient: Joshua Ruiz           Date of Birth: [birth year]           MRN: [id number] Visit Date: 08/14/2021              Requested by: Loyola Mast, MD [address] [place],  [place] [address] PCP: Loyola Mast, MD   Assessment & Plan: Visit Diagnoses:  1. Primary osteoarthritis of left knee     Plan: Patient would like to get authorized for gel again.  He is not interested in cortisone at this time.  Follow-Up Instructions: No follow-ups on file.   Orders:  No orders of the defined types were placed in this encounter.  No orders of the defined types were placed in this encounter.     Procedures: No procedures performed   Clinical Data: No additional findings.   Subjective: Chief Complaint  Patient presents with   Left Knee - Pain    HPI Patient returns today for left knee pain.  Underwent Visco injection years ago by Dr. Prince Rome which helped tremendously.  Would like to do another one if possible.  The injection helped decrease medication requirement and improve quality of life.  Review of Systems   Objective: Vital Signs: There were no vitals taken for this visit.  Physical Exam  Ortho Exam Examination left knee is unchanged. Specialty Comments:  No specialty comments available.  Imaging: No results found.   PMFS History: Patient Active Problem List   Diagnosis Date Noted   Primary osteoarthritis of left knee 08/14/2021   Chronic epididymitis 08/13/2020   Hives 07/11/2020   Rheumatoid arthritis (HCC) 07/11/2020   Osteoarthritis of left knee 07/11/2020   Cervical arthritis 07/11/2020   Gastroesophageal reflux disease 07/11/2020   Presbyopia of both eyes 04/20/2015   Pseudophakia of both eyes 04/20/2015   Past Medical History:  Diagnosis Date   GERD (gastroesophageal reflux disease)    Rheumatoid arthritis (HCC)     Family History  Problem Relation Age of Onset   Rheum arthritis Mother    Diabetes  Mother    Cancer Sister        Lung   Cancer Brother        Lung    Past Surgical History:  Procedure Laterality Date   HEMORRHOID SURGERY     LYMPH NODE BIOPSY     Neck   TRIGGER FINGER RELEASE Right    3rd   Social History   Occupational History   Not on file  Tobacco Use   Smoking status: Never    Passive exposure: Never   Smokeless tobacco: Never  Vaping Use   Vaping Use: Never used  Substance and Sexual Activity   Alcohol use: Not Currently    Comment: none   Drug use: Never   Sexual activity: Yes

## 2021-08-14 NOTE — Telephone Encounter (Signed)
Noted  

## 2021-08-19 NOTE — Telephone Encounter (Signed)
VOB submitted for Monovisc, left knee 

## 2021-08-23 NOTE — Progress Notes (Signed)
Joshua Ruiz - 81 y.o. male MRN [id number]  Date of birth: 06-21-1940  Office Visit Note: Visit Date: 08/12/2021 PCP: Loyola Mast, MD Referred by: Loyola Mast, MD  Subjective: Chief Complaint  Patient presents with   Lower Back - Pain   Right Hip - Pain   HPI:  Joshua Ruiz is a 81 y.o. male who comes in today for planned repeat Right L5-S1  Lumbar Interlaminar epidural steroid injection with fluoroscopic guidance.  The patient has failed conservative care including home exercise, medications, time and activity modification.  This injection will be diagnostic and hopefully therapeutic.  Please see requesting physician notes for further details and justification. Patient received more than 50% pain relief from prior injection.   Referring: Dr. Glee Arvin and Ellin Goodie, FNP    ROS Otherwise per HPI.  Assessment & Plan: Visit Diagnoses:    ICD-10-CM   1. Lumbar radiculopathy  M54.16 XR C-ARM NO REPORT    Epidural Steroid injection    methylPREDNISolone acetate (DEPO-MEDROL) injection 80 mg      Plan: No additional findings.   Meds & Orders:  Meds ordered this encounter  Medications   methylPREDNISolone acetate (DEPO-MEDROL) injection 80 mg    Orders Placed This Encounter  Procedures   XR C-ARM NO REPORT   Epidural Steroid injection    Follow-up: Return for visit to requesting provider as needed.   Procedures: No procedures performed  Lumbar Epidural Steroid Injection - Interlaminar Approach with Fluoroscopic Guidance  Patient: Joshua Ruiz      Date of Birth: [birth year] MRN: [id number] PCP: Loyola Mast, MD      Visit Date: 08/12/2021   Universal Protocol:     Consent Given By: the patient  Position: PRONE  Additional Comments: Vital signs were monitored before and after the procedure. Patient was prepped and draped in the usual sterile fashion. The correct patient, procedure, and site was verified.   Injection Procedure  Details:   Procedure diagnoses: Lumbar radiculopathy [M54.16]   Meds Administered:  Meds ordered this encounter  Medications   methylPREDNISolone acetate (DEPO-MEDROL) injection 80 mg     Laterality: Right  Location/Site:  L5-S1  Needle: 3.5 in., 20 ga. Tuohy  Needle Placement: Paramedian epidural  Findings:   -Comments: Excellent flow of contrast into the epidural space.  Procedure Details: Using a paramedian approach from the side mentioned above, the region overlying the inferior lamina was localized under fluoroscopic visualization and the soft tissues overlying this structure were infiltrated with 4 ml. of 1% Lidocaine without Epinephrine. The Tuohy needle was inserted into the epidural space using a paramedian approach.   The epidural space was localized using loss of resistance along with counter oblique bi-planar fluoroscopic views.  After negative aspirate for air, blood, and CSF, a 2 ml. volume of Isovue-250 was injected into the epidural space and the flow of contrast was observed. Radiographs were obtained for documentation purposes.    The injectate was administered into the level noted above.   Additional Comments:  The patient tolerated the procedure well Dressing: 2 x 2 sterile gauze and Band-Aid    Post-procedure details: Patient was observed during the procedure. Post-procedure instructions were reviewed.  Patient left the clinic in stable condition.   Clinical History: No specialty comments available.     Objective:  VS:  HT:5\' 8"  (172.7 cm)   WT:194 lb 9.6 oz (88.3 kg)  BMI:29.6    BP:(!) 150/84  HR:62bpm  TEMP: ( )  RESP:  Physical Exam Vitals and nursing note reviewed.  Constitutional:      General: He is not in acute distress.    Appearance: Normal appearance. He is not ill-appearing.  HENT:     Head: Normocephalic and atraumatic.     Right Ear: External ear normal.     Left Ear: External ear normal.     Nose: No congestion.  Eyes:      Extraocular Movements: Extraocular movements intact.  Cardiovascular:     Rate and Rhythm: Normal rate.     Pulses: Normal pulses.  Pulmonary:     Effort: Pulmonary effort is normal. No respiratory distress.  Abdominal:     General: There is no distension.     Palpations: Abdomen is soft.  Musculoskeletal:        General: No tenderness or signs of injury.     Cervical back: Neck supple.     Right lower leg: No edema.     Left lower leg: No edema.     Comments: Patient has good distal strength without clonus.  Skin:    Findings: No erythema or rash.  Neurological:     General: No focal deficit present.     Mental Status: He is alert and oriented to person, place, and time.     Sensory: No sensory deficit.     Motor: No weakness or abnormal muscle tone.     Coordination: Coordination normal.  Psychiatric:        Mood and Affect: Mood normal.        Behavior: Behavior normal.      Imaging: No results found.

## 2021-08-23 NOTE — Procedures (Signed)
Lumbar Epidural Steroid Injection - Interlaminar Approach with Fluoroscopic Guidance  Patient: Joshua Ruiz      Date of Birth: 1940-09-02 MRN: [id number] PCP: Loyola Mast, MD      Visit Date: 08/12/2021   Universal Protocol:     Consent Given By: the patient  Position: PRONE  Additional Comments: Vital signs were monitored before and after the procedure. Patient was prepped and draped in the usual sterile fashion. The correct patient, procedure, and site was verified.   Injection Procedure Details:   Procedure diagnoses: Lumbar radiculopathy [M54.16]   Meds Administered:  Meds ordered this encounter  Medications   methylPREDNISolone acetate (DEPO-MEDROL) injection 80 mg     Laterality: Right  Location/Site:  L5-S1  Needle: 3.5 in., 20 ga. Tuohy  Needle Placement: Paramedian epidural  Findings:   -Comments: Excellent flow of contrast into the epidural space.  Procedure Details: Using a paramedian approach from the side mentioned above, the region overlying the inferior lamina was localized under fluoroscopic visualization and the soft tissues overlying this structure were infiltrated with 4 ml. of 1% Lidocaine without Epinephrine. The Tuohy needle was inserted into the epidural space using a paramedian approach.   The epidural space was localized using loss of resistance along with counter oblique bi-planar fluoroscopic views.  After negative aspirate for air, blood, and CSF, a 2 ml. volume of Isovue-250 was injected into the epidural space and the flow of contrast was observed. Radiographs were obtained for documentation purposes.    The injectate was administered into the level noted above.   Additional Comments:  The patient tolerated the procedure well Dressing: 2 x 2 sterile gauze and Band-Aid    Post-procedure details: Patient was observed during the procedure. Post-procedure instructions were reviewed.  Patient left the clinic in stable  condition.

## 2021-09-03 ENCOUNTER — Other Ambulatory Visit: Payer: Self-pay

## 2021-09-03 DIAGNOSIS — M1712 Unilateral primary osteoarthritis, left knee: Secondary | ICD-10-CM

## 2021-09-07 DIAGNOSIS — Z79899 Other long term (current) drug therapy: Secondary | ICD-10-CM | POA: Diagnosis not present

## 2021-09-07 DIAGNOSIS — M069 Rheumatoid arthritis, unspecified: Secondary | ICD-10-CM | POA: Diagnosis not present

## 2021-09-10 ENCOUNTER — Encounter: Payer: Self-pay | Admitting: Orthopaedic Surgery

## 2021-09-10 ENCOUNTER — Ambulatory Visit: Payer: Medicare PPO | Admitting: Orthopaedic Surgery

## 2021-09-10 DIAGNOSIS — M1712 Unilateral primary osteoarthritis, left knee: Secondary | ICD-10-CM | POA: Diagnosis not present

## 2021-09-10 MED ORDER — HYALURONAN 88 MG/4ML IX SOSY
88.0000 mg | PREFILLED_SYRINGE | INTRA_ARTICULAR | Status: AC | PRN
  Administered 2021-09-10: 88 mg via INTRA_ARTICULAR

## 2021-09-10 NOTE — Progress Notes (Signed)
   Office Visit Note   Patient: Joshua Ruiz           Date of Birth: 02/09/1940           MRN: [id number] Visit Date: 09/10/2021              Requested by: Loyola Mast, MD [address] [place],  [place] [address] PCP: Loyola Mast, MD   Assessment & Plan: Visit Diagnoses:  1. Primary osteoarthritis of left knee     Plan: patient here for left knee monovisc injection.  Previous injection worked well in the past and provided good relief.  Decreased medication requirement.    Follow-Up Instructions: No follow-ups on file.   Orders:  Orders Placed This Encounter  Procedures   Large Joint Inj   No orders of the defined types were placed in this encounter.     Procedures: Large Joint Inj: L knee on 09/10/2021 11:09 AM Indications: pain Details: 22 G needle  Arthrogram: No  Medications: 88 mg Hyaluronan 88 MG/4ML Outcome: tolerated well, no immediate complications Patient was prepped and draped in the usual sterile fashion.       Clinical Data: No additional findings.   Subjective: Chief Complaint  Patient presents with   Left Knee - Follow-up    monovisc    HPI  Review of Systems   Objective: Vital Signs: There were no vitals taken for this visit.  Physical Exam  Ortho Exam  Specialty Comments:  No specialty comments available.  Imaging: No results found.   PMFS History: Patient Active Problem List   Diagnosis Date Noted   Primary osteoarthritis of left knee 08/14/2021   Chronic epididymitis 08/13/2020   Hives 07/11/2020   Rheumatoid arthritis (HCC) 07/11/2020   Osteoarthritis of left knee 07/11/2020   Cervical arthritis 07/11/2020   Gastroesophageal reflux disease 07/11/2020   Presbyopia of both eyes 04/20/2015   Pseudophakia of both eyes 04/20/2015   Past Medical History:  Diagnosis Date   GERD (gastroesophageal reflux disease)    Rheumatoid arthritis (HCC)     Family History  Problem Relation Age of Onset    Rheum arthritis Mother    Diabetes Mother    Cancer Sister        Lung   Cancer Brother        Lung    Past Surgical History:  Procedure Laterality Date   HEMORRHOID SURGERY     LYMPH NODE BIOPSY     Neck   TRIGGER FINGER RELEASE Right    3rd   Social History   Occupational History   Not on file  Tobacco Use   Smoking status: Never    Passive exposure: Never   Smokeless tobacco: Never  Vaping Use   Vaping Use: Never used  Substance and Sexual Activity   Alcohol use: Not Currently    Comment: none   Drug use: Never   Sexual activity: Yes

## 2021-09-22 DIAGNOSIS — Z79899 Other long term (current) drug therapy: Secondary | ICD-10-CM | POA: Diagnosis not present

## 2021-09-22 DIAGNOSIS — M47816 Spondylosis without myelopathy or radiculopathy, lumbar region: Secondary | ICD-10-CM | POA: Diagnosis not present

## 2021-09-22 DIAGNOSIS — M069 Rheumatoid arthritis, unspecified: Secondary | ICD-10-CM | POA: Diagnosis not present

## 2021-09-22 DIAGNOSIS — M8949 Other hypertrophic osteoarthropathy, multiple sites: Secondary | ICD-10-CM | POA: Diagnosis not present

## 2021-09-30 DIAGNOSIS — M0689 Other specified rheumatoid arthritis, multiple sites: Secondary | ICD-10-CM | POA: Diagnosis not present

## 2021-09-30 DIAGNOSIS — H43813 Vitreous degeneration, bilateral: Secondary | ICD-10-CM | POA: Diagnosis not present

## 2021-09-30 DIAGNOSIS — H524 Presbyopia: Secondary | ICD-10-CM | POA: Diagnosis not present

## 2021-09-30 DIAGNOSIS — H353131 Nonexudative age-related macular degeneration, bilateral, early dry stage: Secondary | ICD-10-CM | POA: Diagnosis not present

## 2021-09-30 DIAGNOSIS — Z961 Presence of intraocular lens: Secondary | ICD-10-CM | POA: Diagnosis not present

## 2021-09-30 DIAGNOSIS — H52223 Regular astigmatism, bilateral: Secondary | ICD-10-CM | POA: Diagnosis not present

## 2021-09-30 DIAGNOSIS — H26493 Other secondary cataract, bilateral: Secondary | ICD-10-CM | POA: Diagnosis not present

## 2021-09-30 DIAGNOSIS — H5213 Myopia, bilateral: Secondary | ICD-10-CM | POA: Diagnosis not present

## 2021-09-30 DIAGNOSIS — Z79899 Other long term (current) drug therapy: Secondary | ICD-10-CM | POA: Diagnosis not present

## 2021-10-06 ENCOUNTER — Telehealth: Payer: Self-pay | Admitting: Physical Medicine and Rehabilitation

## 2021-10-06 NOTE — Telephone Encounter (Signed)
Pt called requesting a referral be sent from Dr Ernestina Patches for pt to see an Chief of Staff. Please call pt about this matter at [phone].

## 2021-10-07 ENCOUNTER — Other Ambulatory Visit: Payer: Self-pay | Admitting: Physical Medicine and Rehabilitation

## 2021-10-07 DIAGNOSIS — M48062 Spinal stenosis, lumbar region with neurogenic claudication: Secondary | ICD-10-CM

## 2021-10-07 DIAGNOSIS — M5416 Radiculopathy, lumbar region: Secondary | ICD-10-CM

## 2021-10-07 NOTE — Telephone Encounter (Signed)
I called pt advised.

## 2021-10-19 DIAGNOSIS — Z6830 Body mass index (BMI) 30.0-30.9, adult: Secondary | ICD-10-CM | POA: Diagnosis not present

## 2021-10-19 DIAGNOSIS — M48062 Spinal stenosis, lumbar region with neurogenic claudication: Secondary | ICD-10-CM | POA: Diagnosis not present

## 2021-10-19 DIAGNOSIS — M47816 Spondylosis without myelopathy or radiculopathy, lumbar region: Secondary | ICD-10-CM | POA: Diagnosis not present

## 2021-10-19 DIAGNOSIS — M4316 Spondylolisthesis, lumbar region: Secondary | ICD-10-CM | POA: Diagnosis not present

## 2021-10-20 ENCOUNTER — Encounter: Payer: Self-pay | Admitting: Family Medicine

## 2021-10-20 DIAGNOSIS — M48062 Spinal stenosis, lumbar region with neurogenic claudication: Secondary | ICD-10-CM | POA: Insufficient documentation

## 2021-10-20 DIAGNOSIS — M4316 Spondylolisthesis, lumbar region: Secondary | ICD-10-CM | POA: Insufficient documentation

## 2021-10-20 DIAGNOSIS — M47816 Spondylosis without myelopathy or radiculopathy, lumbar region: Secondary | ICD-10-CM | POA: Insufficient documentation

## 2021-10-21 ENCOUNTER — Ambulatory Visit (INDEPENDENT_AMBULATORY_CARE_PROVIDER_SITE_OTHER): Payer: Medicare PPO

## 2021-10-21 VITALS — Ht 68.0 in | Wt 198.0 lb

## 2021-10-21 DIAGNOSIS — Z0000 Encounter for general adult medical examination without abnormal findings: Secondary | ICD-10-CM

## 2021-10-21 NOTE — Progress Notes (Signed)
Subjective:   Joshua Ruiz is a 81 y.o. male who presents for Medicare Annual/Subsequent preventive examination.   Virtual Visit via Telephone Note  I connected with  [name] on 10/21/21 at  9:15 AM EDT by telephone and verified that I am speaking with the correct person using two identifiers.  Location: Patient: Home  Provider: [place]  Persons participating in the virtual visit: patient/Nurse Health Advisor   I discussed the limitations, risks, security and privacy concerns of performing an evaluation and management service by telephone and the availability of in person appointments. The patient expressed understanding and agreed to proceed.  Interactive audio and video telecommunications were attempted between this nurse and patient, however failed, due to patient having technical difficulties OR patient did not have access to video capability.  We continued and completed visit with audio only.  Some vital signs may be absent or patient reported.   Daphane Shepherd, LPN  Review of Systems     Cardiac Risk Factors include: advanced age (>60men, >69 women);male gender     Objective:    Today's Vitals   10/21/21 0922  Weight: 198 lb (89.8 kg)  Height: 5\' 8"  (1.727 m)   Body mass index is 30.11 kg/m.     10/21/2021    9:25 AM 10/04/2020   11:24 AM 07/01/2020    3:45 PM  Advanced Directives  Does Patient Have a Medical Advance Directive? Yes Yes Yes  Type of Paramedic of [place];Living will [place];Living will [place];Out of facility DNR (pink MOST or yellow form);Living will  Copy of [place] in Chart? No - copy requested No - copy requested     Current Medications (verified) Outpatient Encounter Medications as of 10/21/2021  Medication Sig   cetirizine (ZYRTEC) 10 MG tablet Take by mouth.   diclofenac (VOLTAREN) 75 MG EC tablet Take 1 tablet (75 mg total) by mouth  2 (two) times daily as needed. May start taking AFTER finished with the steroid taper   folic acid (FOLVITE) 1 MG tablet Take by mouth.   hydroxychloroquine (PLAQUENIL) 200 MG tablet Take by mouth.   loratadine (CLARITIN) 10 MG tablet Take by mouth.   methotrexate (RHEUMATREX) 2.5 MG tablet Take 2.5 mg by mouth once a week. Caution:Chemotherapy. Protect from light.   Multiple Vitamin (MULTI-VITAMIN) tablet Take by mouth.   omeprazole (PRILOSEC) 20 MG capsule Take by mouth.   predniSONE (STERAPRED UNI-PAK 21 TAB) 10 MG (21) TBPK tablet Take as directed.  Do not take with any nsaids   No facility-administered encounter medications on file as of 10/21/2021.    Allergies (verified) Patient has no known allergies.   History: Past Medical History:  Diagnosis Date   GERD (gastroesophageal reflux disease)    Rheumatoid arthritis ([place])    Past Surgical History:  Procedure Laterality Date   HEMORRHOID SURGERY     LYMPH NODE BIOPSY     Neck   TRIGGER FINGER RELEASE Right    3rd   Family History  Problem Relation Age of Onset   Rheum arthritis Mother    Diabetes Mother    Cancer Sister        Lung   Cancer Brother        Lung   Social History   Socioeconomic History   Marital status: Married    Spouse name: Not on file   Number of children: 1   Years of education: Not on file  Highest education level: Not on file  Occupational History   Not on file  Tobacco Use   Smoking status: Never    Passive exposure: Never   Smokeless tobacco: Never  Vaping Use   Vaping Use: Never used  Substance and Sexual Activity   Alcohol use: Not Currently    Comment: none   Drug use: Never   Sexual activity: Yes  Other Topics Concern   Not on file  Social History Narrative   Retired- Advice worker   Social Determinants of Health   Financial Resource Strain: Low Risk  (10/21/2021)   Overall Financial Resource Strain (CARDIA)    Difficulty of Paying Living Expenses: Not hard at all   Food Insecurity: No Food Insecurity (10/21/2021)   Hunger Vital Sign    Worried About Running Out of Food in the Last Year: Never true    Ran Out of Food in the Last Year: Never true  Transportation Needs: No Transportation Needs (10/04/2020)   PRAPARE - Hydrologist (Medical): No    Lack of Transportation (Non-Medical): No  Physical Activity: Insufficiently Active (10/21/2021)   Exercise Vital Sign    Days of Exercise per Week: 3 days    Minutes of Exercise per Session: 30 min  Stress: No Stress Concern Present (10/21/2021)   [place]    Feeling of Stress : Not at all  Social Connections: Moderately Isolated (10/21/2021)   Social Connection and Isolation Panel [NHANES]    Frequency of Communication with Friends and Family: More than three times a week    Frequency of Social Gatherings with Friends and Family: More than three times a week    Attends Religious Services: More than 4 times per year    Active Member of [employer] or Organizations: No    Attends Archivist Meetings: Never    Marital Status: Widowed    Tobacco Counseling Counseling given: Not Answered   Clinical Intake:  Pre-visit preparation completed: Yes  Pain : No/denies pain     Nutritional Risks: None Diabetes: No  How often do you need to have someone help you when you read instructions, pamphlets, or other written materials from your doctor or pharmacy?: 1 - Never  Diabetic?no   Interpreter Needed?: No  Information entered by :: Jadene Pierini, LPN   Activities of Daily Living    10/21/2021    9:26 AM  In your present state of health, do you have any difficulty performing the following activities:  Hearing? 0  Vision? 0  Difficulty concentrating or making decisions? 0  Walking or climbing stairs? 0  Dressing or bathing? 0  Doing errands, shopping? 0  Preparing Food and eating ? N  Using  the Toilet? N  In the past six months, have you accidently leaked urine? N  Do you have problems with loss of bowel control? N  Managing your Medications? N  Managing your Finances? N  Housekeeping or managing your Housekeeping? N    Patient Care Team: Haydee Salter, MD as PCP - General (Family Medicine) Junius Roads, Legrand Como, MD (Sports Medicine)  Indicate any recent Medical Services you may have received from other than [facility] providers in the past year (date may be approximate).     Assessment:   This is a routine wellness examination for [place].  Hearing/Vision screen Vision Screening - Comments:: Annual eye exams wear glasses   Dietary issues and exercise activities discussed: Current  Exercise Habits: Home exercise routine, Type of exercise: walking, Time (Minutes): 30, Frequency (Times/Week): 3, Weekly Exercise (Minutes/Week): 90, Intensity: Mild, Exercise limited by: orthopedic condition(s)   Goals Addressed             This Visit's Progress    Exercise 3x per week (30 min per time)         Depression Screen    10/21/2021    9:25 AM 10/04/2020   11:19 AM 07/11/2020    2:12 PM 07/11/2020    2:04 PM  PHQ 2/9 Scores  PHQ - 2 Score 0 0 0 0  PHQ- 9 Score   0 0    Fall Risk    10/21/2021    9:23 AM 10/04/2020   11:19 AM 07/11/2020    2:01 PM  Fall Risk   Falls in the past year? 0 0 0  Number falls in past yr: 0 0 0  Injury with Fall? 0 0 0  Risk for fall due to : No Fall Risks    Follow up Falls prevention discussed      [place]:  Any stairs in or around the home? No  If so, are there any without handrails? No  Home free of loose throw rugs in walkways, pet beds, electrical cords, etc? Yes  Adequate lighting in your home to reduce risk of falls? Yes   ASSISTIVE DEVICES UTILIZED TO PREVENT FALLS:  Life alert? No  Use of a cane, walker or w/c? No  Grab bars in the bathroom? Yes  Shower chair or bench in shower? Yes  Elevated  toilet seat or a handicapped toilet? Yes       10/21/2021    9:26 AM 10/04/2020   11:24 AM  6CIT Screen  What Year? 0 points 0 points  What month? 0 points 0 points  What time? 0 points 0 points  Count back from 20 0 points 0 points  Months in reverse 0 points 0 points  Repeat phrase 0 points 4 points  Total Score 0 points 4 points    Immunizations Immunization History  Administered Date(s) Administered   PFIZER(Purple Top)SARS-COV-2 Vaccination 03/07/2019, 04/13/2019   PNEUMOCOCCAL CONJUGATE-20 01/13/2021   Pfizer Covid-19 Vaccine Bivalent Booster 76yrs & up 02/06/2020    TDAP status: Due, Education has been provided regarding the importance of this vaccine. Advised may receive this vaccine at local pharmacy or Health Dept. Aware to provide a copy of the vaccination record if obtained from local pharmacy or Health Dept. Verbalized acceptance and understanding.  Flu Vaccine status: Due, Education has been provided regarding the importance of this vaccine. Advised may receive this vaccine at local pharmacy or Health Dept. Aware to provide a copy of the vaccination record if obtained from local pharmacy or Health Dept. Verbalized acceptance and understanding.  Pneumococcal vaccine status: Up to date  Covid-19 vaccine status: Completed vaccines  Qualifies for Shingles Vaccine? Yes   Zostavax completed No   Shingrix Completed?: No.    Education has been provided regarding the importance of this vaccine. Patient has been advised to call insurance company to determine out of pocket expense if they have not yet received this vaccine. Advised may also receive vaccine at local pharmacy or Health Dept. Verbalized acceptance and understanding.  Screening Tests Health Maintenance  Topic Date Due   TETANUS/TDAP  Never done   Zoster Vaccines- Shingrix (1 of 2) Never done   COVID-19 Vaccine (4 - Pfizer risk  series) 04/02/2020   Pneumonia Vaccine 75+ Years old  Completed   HPV VACCINES   Aged Out   INFLUENZA VACCINE  Discontinued    Health Maintenance  Health Maintenance Due  Topic Date Due   TETANUS/TDAP  Never done   Zoster Vaccines- Shingrix (1 of 2) Never done   COVID-19 Vaccine (4 - Pfizer risk series) 04/02/2020    Colorectal cancer screening: No longer required.   Lung Cancer Screening: (Low Dose CT Chest recommended if Age 61-80 years, 30 pack-year currently smoking OR have quit w/in 15years.) does not qualify.   Lung Cancer Screening Referral: n/a  Additional Screening:  Hepatitis C Screening: does not qualify;   Vision Screening: Recommended annual ophthalmology exams for early detection of glaucoma and other disorders of the eye. Is the patient up to date with their annual eye exam?  Yes  Who is the provider or what is the name of the office in which the patient attends annual eye exams? Dr.Evans  If pt is not established with a provider, would they like to be referred to a provider to establish care? No .   Dental Screening: Recommended annual dental exams for proper oral hygiene  Community Resource Referral / Chronic Care Management: CRR required this visit?  No   CCM required this visit?  No      Plan:     I have personally reviewed and noted the following in the patient's chart:   Medical and social history Use of alcohol, tobacco or illicit drugs  Current medications and supplements including opioid prescriptions. Patient is not currently taking opioid prescriptions. Functional ability and status Nutritional status Physical activity Advanced directives List of other physicians Hospitalizations, surgeries, and ER visits in previous 12 months Vitals Screenings to include cognitive, depression, and falls Referrals and appointments  In addition, I have reviewed and discussed with patient certain preventive protocols, quality metrics, and best practice recommendations. A written personalized care plan for preventive services as well  as general preventive health recommendations were provided to patient.     Daphane Shepherd, LPN   [id number]   Nurse Notes: Due TDAP/Flu Vaccine

## 2021-10-21 NOTE — Patient Instructions (Signed)
Joshua Ruiz , Thank you for taking time to come for your Medicare Wellness Visit. I appreciate your ongoing commitment to your health goals. Please review the following plan we discussed and let me know if I can assist you in the future.   These are the goals we discussed:  Goals      Exercise 3x per week (30 min per time)        This is a list of the screening recommended for you and due dates:  Health Maintenance  Topic Date Due   Tetanus Vaccine  Never done   Zoster (Shingles) Vaccine (1 of 2) Never done   COVID-19 Vaccine (4 - Pfizer risk series) 04/02/2020   Pneumonia Vaccine  Completed   HPV Vaccine  Aged Out   Flu Shot  Discontinued    Advanced directives: Please bring a copy of your health care power of attorney and living will to the office to be added to your chart at your convenience.   Conditions/risks identified: Aim for 30 minutes of exercise or brisk walking, 6-8 glasses of water, and 5 servings of fruits and vegetables each day.   Next appointment: Follow up in one year for your annual wellness visit.   Preventive Care 23 Years and Older, Male  Preventive care refers to lifestyle choices and visits with your health care provider that can promote health and wellness. What does preventive care include? A yearly physical exam. This is also called an annual well check. Dental exams once or twice a year. Routine eye exams. Ask your health care provider how often you should have your eyes checked. Personal lifestyle choices, including: Daily care of your teeth and gums. Regular physical activity. Eating a healthy diet. Avoiding tobacco and drug use. Limiting alcohol use. Practicing safe sex. Taking low doses of aspirin every day. Taking vitamin and mineral supplements as recommended by your health care provider. What happens during an annual well check? The services and screenings done by your health care provider during your annual well check will depend on  your age, overall health, lifestyle risk factors, and family history of disease. Counseling  Your health care provider may ask you questions about your: Alcohol use. Tobacco use. Drug use. Emotional well-being. Home and relationship well-being. Sexual activity. Eating habits. History of falls. Memory and ability to understand (cognition). Work and work Statistician. Screening  You may have the following tests or measurements: Height, weight, and BMI. Blood pressure. Lipid and cholesterol levels. These may be checked every 5 years, or more frequently if you are over 50 years old. Skin check. Lung cancer screening. You may have this screening every year starting at age 74 if you have a 30-pack-year history of smoking and currently smoke or have quit within the past 15 years. Fecal occult blood test (FOBT) of the stool. You may have this test every year starting at age 67. Flexible sigmoidoscopy or colonoscopy. You may have a sigmoidoscopy every 5 years or a colonoscopy every 10 years starting at age 23. Prostate cancer screening. Recommendations will vary depending on your family history and other risks. Hepatitis C blood test. Hepatitis B blood test. Sexually transmitted disease (STD) testing. Diabetes screening. This is done by checking your blood sugar (glucose) after you have not eaten for a while (fasting). You may have this done every 1-3 years. Abdominal aortic aneurysm (AAA) screening. You may need this if you are a current or former smoker. Osteoporosis. You may be screened starting at age 16  if you are at high risk. Talk with your health care provider about your test results, treatment options, and if necessary, the need for more tests. Vaccines  Your health care provider may recommend certain vaccines, such as: Influenza vaccine. This is recommended every year. Tetanus, diphtheria, and acellular pertussis (Tdap, Td) vaccine. You may need a Td booster every 10 years. Zoster  vaccine. You may need this after age 79. Pneumococcal 13-valent conjugate (PCV13) vaccine. One dose is recommended after age 38. Pneumococcal polysaccharide (PPSV23) vaccine. One dose is recommended after age 99. Talk to your health care provider about which screenings and vaccines you need and how often you need them. This information is not intended to replace advice given to you by your health care provider. Make sure you discuss any questions you have with your health care provider. Document Released: 01/24/2015 Document Revised: 09/17/2015 Document Reviewed: 10/29/2014 Elsevier Interactive Patient Education  2017 [employer] Prevention in the Home Falls can cause injuries. They can happen to people of all ages. There are many things you can do to make your home safe and to help prevent falls. What can I do on the outside of my home? Regularly fix the edges of walkways and driveways and fix any cracks. Remove anything that might make you trip as you walk through a door, such as a raised step or threshold. Trim any bushes or trees on the path to your home. Use bright outdoor lighting. Clear any walking paths of anything that might make someone trip, such as rocks or tools. Regularly check to see if handrails are loose or broken. Make sure that both sides of any steps have handrails. Any raised decks and porches should have guardrails on the edges. Have any leaves, snow, or ice cleared regularly. Use sand or salt on walking paths during winter. Clean up any spills in your garage right away. This includes oil or grease spills. What can I do in the bathroom? Use night lights. Install grab bars by the toilet and in the tub and shower. Do not use towel bars as grab bars. Use non-skid mats or decals in the tub or shower. If you need to sit down in the shower, use a plastic, non-slip stool. Keep the floor dry. Clean up any water that spills on the floor as soon as it happens. Remove  soap buildup in the tub or shower regularly. Attach bath mats securely with double-sided non-slip rug tape. Do not have throw rugs and other things on the floor that can make you trip. What can I do in the bedroom? Use night lights. Make sure that you have a light by your bed that is easy to reach. Do not use any sheets or blankets that are too big for your bed. They should not hang down onto the floor. Have a firm chair that has side arms. You can use this for support while you get dressed. Do not have throw rugs and other things on the floor that can make you trip. What can I do in the kitchen? Clean up any spills right away. Avoid walking on wet floors. Keep items that you use a lot in easy-to-reach places. If you need to reach something above you, use a strong step stool that has a grab bar. Keep electrical cords out of the way. Do not use floor polish or wax that makes floors slippery. If you must use wax, use non-skid floor wax. Do not have throw rugs and other things  on the floor that can make you trip. What can I do with my stairs? Do not leave any items on the stairs. Make sure that there are handrails on both sides of the stairs and use them. Fix handrails that are broken or loose. Make sure that handrails are as long as the stairways. Check any carpeting to make sure that it is firmly attached to the stairs. Fix any carpet that is loose or worn. Avoid having throw rugs at the top or bottom of the stairs. If you do have throw rugs, attach them to the floor with carpet tape. Make sure that you have a light switch at the top of the stairs and the bottom of the stairs. If you do not have them, ask someone to add them for you. What else can I do to help prevent falls? Wear shoes that: Do not have high heels. Have rubber bottoms. Are comfortable and fit you well. Are closed at the toe. Do not wear sandals. If you use a stepladder: Make sure that it is fully opened. Do not climb a  closed stepladder. Make sure that both sides of the stepladder are locked into place. Ask someone to hold it for you, if possible. Clearly mark and make sure that you can see: Any grab bars or handrails. First and last steps. Where the edge of each step is. Use tools that help you move around (mobility aids) if they are needed. These include: Canes. Walkers. Scooters. Crutches. Turn on the lights when you go into a dark area. Replace any light bulbs as soon as they burn out. Set up your furniture so you have a clear path. Avoid moving your furniture around. If any of your floors are uneven, fix them. If there are any pets around you, be aware of where they are. Review your medicines with your doctor. Some medicines can make you feel dizzy. This can increase your chance of falling. Ask your doctor what other things that you can do to help prevent falls. This information is not intended to replace advice given to you by your health care provider. Make sure you discuss any questions you have with your health care provider. Document Released: 10/24/2008 Document Revised: 06/05/2015 Document Reviewed: 02/01/2014 Elsevier Interactive Patient Education  2017 [employer].

## 2021-10-28 ENCOUNTER — Other Ambulatory Visit: Payer: Self-pay | Admitting: Neurosurgery

## 2021-10-29 ENCOUNTER — Other Ambulatory Visit: Payer: Self-pay | Admitting: Neurosurgery

## 2021-11-05 DIAGNOSIS — Z79899 Other long term (current) drug therapy: Secondary | ICD-10-CM | POA: Diagnosis not present

## 2021-11-05 DIAGNOSIS — M069 Rheumatoid arthritis, unspecified: Secondary | ICD-10-CM | POA: Diagnosis not present

## 2021-11-09 NOTE — Pre-Procedure Instructions (Signed)
Surgical Instructions    Your procedure is scheduled on November 20, 2021.  Report to [facility] Main Entrance "A" at 5:30 A.M., then check in with the Admitting office.  Call this number if you have problems the morning of surgery:  [phone]   If you have any questions prior to your surgery date call [phone]: Open Monday-Friday 8am-4pm    Remember:  Do not eat or drink after midnight the night before your surgery      Take these medicines the morning of surgery with A SIP OF WATER:  finasteride (PROSCAR)  loratadine (CLARITIN)  acetaminophen (TYLENOL) - may take if needed    Follow your surgeon's instructions on if/when to stop methotrexate (RHEUMATREX) and hydroxychloroquine (PLAQUENIL).  If no instructions were given by your surgeon then you will need to call the office to get those instructions.     As of today, STOP taking any Aspirin (unless otherwise instructed by your surgeon) Aleve, Naproxen, Ibuprofen, Motrin, Advil, Goody's, BC's, all herbal medications, fish oil, and all vitamins.                      Do NOT Smoke (Tobacco/Vaping) for 24 hours prior to your procedure.  If you use a CPAP at night, you may bring your mask/headgear for your overnight stay.   Contacts, glasses, piercing's, hearing aid's, dentures or partials may not be worn into surgery, please bring cases for these belongings.    For patients admitted to the hospital, discharge time will be determined by your treatment team.   Patients discharged the day of surgery will not be allowed to drive home, and someone needs to stay with them for 24 hours.  SURGICAL WAITING ROOM VISITATION Patients having surgery or a procedure may have no more than 2 support people in the waiting area - these visitors may rotate.   Children under the age of 67 must have an adult with them who is not the patient. If the patient needs to stay at the hospital during part of their recovery, the visitor guidelines for  inpatient rooms apply. Pre-op nurse will coordinate an appropriate time for 1 support person to accompany patient in pre-op.  This support person may not rotate.   Please refer to the [facility] website for the visitor guidelines for Inpatients (after your surgery is over and you are in a regular room).    Special instructions:   [facility]- Preparing For Surgery  Before surgery, you can play an important role. Because skin is not sterile, your skin needs to be as free of germs as possible. You can reduce the number of germs on your skin by washing with CHG (chlorahexidine gluconate) Soap before surgery.  CHG is an antiseptic cleaner which kills germs and bonds with the skin to continue killing germs even after washing.    Oral Hygiene is also important to reduce your risk of infection.  Remember - BRUSH YOUR TEETH THE MORNING OF SURGERY WITH YOUR REGULAR TOOTHPASTE  Please do not use if you have an allergy to CHG or antibacterial soaps. If your skin becomes reddened/irritated stop using the CHG.  Do not shave (including legs and underarms) for at least 48 hours prior to first CHG shower. It is OK to shave your face.  Please follow these instructions carefully.   Shower the NIGHT BEFORE SURGERY and the MORNING OF SURGERY  If you chose to wash your hair, wash your hair first as usual with your normal  shampoo.  After you shampoo, rinse your hair and body thoroughly to remove the shampoo.  Use CHG Soap as you would any other liquid soap. You can apply CHG directly to the skin and wash gently with a scrungie or a clean washcloth.   Apply the CHG Soap to your body ONLY FROM THE NECK DOWN.  Do not use on open wounds or open sores. Avoid contact with your eyes, ears, mouth and genitals (private parts). Wash Face and genitals (private parts)  with your normal soap.   Wash thoroughly, paying special attention to the area where your surgery will be performed.  Thoroughly rinse your body with  warm water from the neck down.  DO NOT shower/wash with your normal soap after using and rinsing off the CHG Soap.  Pat yourself dry with a CLEAN TOWEL.  Wear CLEAN PAJAMAS to bed the night before surgery  Place CLEAN SHEETS on your bed the night before your surgery  DO NOT SLEEP WITH PETS.   Day of Surgery: Take a shower with CHG soap. Do not wear jewelry or makeup Do not wear lotions, powders, perfumes/colognes, or deodorant. Do not shave 48 hours prior to surgery.  Men may shave face and neck. Do not bring valuables to the hospital.  [facility] is not responsible for any belongings or valuables. Do not wear nail polish, gel polish, artificial nails, or any other type of covering on natural nails (fingers and toes) If you have artificial nails or gel coating that need to be removed by a nail salon, please have this removed prior to surgery. Artificial nails or gel coating may interfere with anesthesia's ability to adequately monitor your vital signs.  Wear Clean/Comfortable clothing the morning of surgery Remember to brush your teeth WITH YOUR REGULAR TOOTHPASTE.   Please read over the following fact sheets that you were given.    If you received a COVID test during your pre-op visit  it is requested that you wear a mask when out in public, stay away from anyone that may not be feeling well and notify your surgeon if you develop symptoms. If you have been in contact with anyone that has tested positive in the last 10 days please notify you surgeon.

## 2021-11-10 ENCOUNTER — Other Ambulatory Visit: Payer: Self-pay

## 2021-11-10 ENCOUNTER — Encounter (HOSPITAL_COMMUNITY)
Admission: RE | Admit: 2021-11-10 | Discharge: 2021-11-10 | Disposition: A | Discharge status: Home / Self Care | Payer: Medicare PPO | Source: Ambulatory Visit | Attending: Neurosurgery | Admitting: Neurosurgery

## 2021-11-10 ENCOUNTER — Encounter (HOSPITAL_COMMUNITY): Payer: Self-pay

## 2021-11-10 VITALS — BP 151/80 | HR 61 | Temp 97.8°F | Resp 17 | Ht 68.0 in | Wt 200.5 lb

## 2021-11-10 DIAGNOSIS — Z01818 Encounter for other preprocedural examination: Secondary | ICD-10-CM

## 2021-11-10 DIAGNOSIS — Z01812 Encounter for preprocedural laboratory examination: Secondary | ICD-10-CM | POA: Diagnosis not present

## 2021-11-10 LAB — TYPE AND SCREEN
ABO/RH(D): A POS
Antibody Screen: NEGATIVE

## 2021-11-10 LAB — BASIC METABOLIC PANEL
Anion gap: 5 (ref 5–15)
BUN: 14 mg/dL (ref 8–23)
CO2: 28 mmol/L (ref 22–32)
Calcium: 8.8 mg/dL — ABNORMAL LOW (ref 8.9–10.3)
Chloride: 108 mmol/L (ref 98–111)
Creatinine, Ser: 0.97 mg/dL (ref 0.61–1.24)
GFR, Estimated: >60 mL/min (ref >60)
Glucose, Bld: 108 mg/dL — ABNORMAL HIGH (ref 70–99)
Potassium: 4.3 mmol/L (ref 3.5–5.1)
Sodium: 141 mmol/L (ref 135–145)

## 2021-11-10 LAB — CBC
HCT: 35.5 % — ABNORMAL LOW (ref 39.0–52.0)
Hemoglobin: 12.5 g/dL — ABNORMAL LOW (ref 13.0–17.0)
MCH: 32.9 pg (ref 26.0–34.0)
MCHC: 35.2 g/dL (ref 30.0–36.0)
MCV: 93.4 fL (ref 80.0–100.0)
Platelets: 203 10*3/uL (ref 150–400)
RBC: 3.8 MIL/uL — ABNORMAL LOW (ref 4.22–5.81)
RDW: 12.7 % (ref 11.5–15.5)
WBC: 5.8 10*3/uL (ref 4.0–10.5)
nRBC: 0 % (ref 0.0–0.2)

## 2021-11-10 LAB — SURGICAL PCR SCREEN
MRSA, PCR: NEGATIVE
Staphylococcus aureus: NEGATIVE

## 2021-11-10 NOTE — Progress Notes (Signed)
PCP: Dr. Arlester Marker Internist: Dr. Drinda Butts, [place] Cardiologist: Denies  EKG: n/a CXR: n/a ECHO: denies Stress Test: denies Cardiac Cath: denies  Pt takes Plaquenil and Methotrexate for arthritis. Instructed pt to call provider to find out if/when pt needs to stop prior to surgery.  Pt's BP upon arrival 176/83, recheck 151/80. Does not take BP meds at home, never diagnosed with HTN. Pt Endorses pain currently. Asked Anesthesia regarding lab protocol, obtain BMET today per [place].  Patient denies shortness of breath, fever, cough, and chest pain at PAT appointment.  Patient verbalized understanding of instructions provided today at the PAT appointment.  Patient asked to review instructions at home and day of surgery.

## 2021-11-11 NOTE — Progress Notes (Signed)
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   Location: [facility] OR ROOM 21 / [place] [facility]   Surgeons: Consuella Lose, MD       DISCUSSION:  VS: BP (!) 151/80   Pulse 61   Temp 36.6 C   Resp 17   Ht 5\' 8"  (1.727 m)   Wt 90.9 kg   SpO2 100%   BMI 30.49 kg/m   PROVIDERS: Haydee Salter, MD   LABS: {[facility] AN LABS REVIEWED:112001::"Labs reviewed: Acceptable for surgery."} (all labs ordered are listed, but only abnormal results are displayed)  Labs Reviewed  BASIC METABOLIC PANEL - Abnormal; Notable for the following components:      Result Value   Glucose, Bld 108 (*)    Calcium 8.8 (*)    All other components within normal limits  CBC - Abnormal; Notable for the following components:   RBC 3.80 (*)    Hemoglobin 12.5 (*)    HCT 35.5 (*)    All other components within normal limits  SURGICAL PCR SCREEN  TYPE AND SCREEN     IMAGES:   EKG:   CV:  Past Medical History:  Diagnosis Date   GERD (gastroesophageal reflux disease)    Rheumatoid arthritis (HCC)     Past Surgical History:  Procedure Laterality Date   EYE SURGERY Bilateral    cataract extraction   HEMORRHOID SURGERY     LYMPH NODE BIOPSY     Neck   TRIGGER FINGER RELEASE Right    3rd    MEDICATIONS:  acetaminophen (TYLENOL) 325 MG tablet   cetirizine (ZYRTEC) 10 MG tablet   diclofenac (VOLTAREN) 75 MG EC tablet   finasteride (PROSCAR) 5 MG tablet   folic acid (FOLVITE) 1 MG tablet   hydroxychloroquine (PLAQUENIL) 200 MG tablet   loratadine (CLARITIN) 10 MG tablet   methotrexate (RHEUMATREX) 2.5 MG tablet   Multiple Vitamin (MULTIVITAMIN WITH MINERALS) TABS tablet   Multiple Vitamins-Minerals (PRESERVISION AREDS 2) CAPS   naproxen sodium (ALEVE) 220 MG tablet    omeprazole (PRILOSEC) 20 MG capsule   predniSONE (STERAPRED UNI-PAK 21 TAB) 10 MG (21) TBPK tablet   No current facility-administered medications for this encounter.

## 2021-11-12 NOTE — Anesthesia Preprocedure Evaluation (Addendum)
Anesthesia Evaluation  Patient identified by MRN, date of birth, ID band Patient awake    Reviewed: Allergy & Precautions, NPO status , Patient's Chart, lab work & pertinent test results  History of Anesthesia Complications Negative for: history of anesthetic complications  Airway Mallampati: III  TM Distance: >3 FB Neck ROM: Full    Dental  (+) Dental Advisory Given, Teeth Intact   Pulmonary neg pulmonary ROS   Pulmonary exam normal        Cardiovascular negative cardio ROS Normal cardiovascular exam     Neuro/Psych negative neurological ROS  negative psych ROS   GI/Hepatic Neg liver ROS,GERD  Medicated and Controlled,,  Endo/Other   Obesity   Renal/GU negative Renal ROS     Musculoskeletal  (+) Arthritis , Osteoarthritis and Rheumatoid disorders,    Abdominal   Peds  Hematology  (+) Blood dyscrasia, anemia   Anesthesia Other Findings   Reproductive/Obstetrics                             Anesthesia Physical Anesthesia Plan  ASA: 2  Anesthesia Plan: General   Post-op Pain Management: Tylenol PO (pre-op)*   Induction: Intravenous  PONV Risk Score and Plan: 2 and Treatment may vary due to age or medical condition, Ondansetron and Propofol infusion  Airway Management Planned: Oral ETT  Additional Equipment: None  Intra-op Plan:   Post-operative Plan: Extubation in OR  Informed Consent: I have reviewed the patients History and Physical, chart, labs and discussed the procedure including the risks, benefits and alternatives for the proposed anesthesia with the patient or authorized representative who has indicated his/her understanding and acceptance.     Dental advisory given  Plan Discussed with: CRNA and Anesthesiologist  Anesthesia Plan Comments:        Anesthesia Quick Evaluation

## 2021-11-20 ENCOUNTER — Encounter (HOSPITAL_COMMUNITY): Payer: Self-pay | Admitting: Neurosurgery

## 2021-11-20 ENCOUNTER — Ambulatory Visit (HOSPITAL_COMMUNITY): Payer: Medicare PPO | Admitting: Physician Assistant

## 2021-11-20 ENCOUNTER — Ambulatory Visit (HOSPITAL_COMMUNITY): Payer: Medicare PPO

## 2021-11-20 ENCOUNTER — Observation Stay (HOSPITAL_COMMUNITY)
Admission: RE | Admit: 2021-11-20 | Discharge: 2021-11-23 | Disposition: A | Discharge status: Home Health | Payer: Medicare PPO | Attending: Neurosurgery | Admitting: Neurosurgery

## 2021-11-20 ENCOUNTER — Ambulatory Visit (HOSPITAL_COMMUNITY)
Admission: RE | Disposition: A | Discharge status: Home Health | Payer: Self-pay | Source: Home / Self Care | Attending: Neurosurgery

## 2021-11-20 ENCOUNTER — Other Ambulatory Visit: Payer: Self-pay

## 2021-11-20 DIAGNOSIS — M47896 Other spondylosis, lumbar region: Secondary | ICD-10-CM | POA: Insufficient documentation

## 2021-11-20 DIAGNOSIS — Z981 Arthrodesis status: Secondary | ICD-10-CM | POA: Diagnosis not present

## 2021-11-20 DIAGNOSIS — M47816 Spondylosis without myelopathy or radiculopathy, lumbar region: Secondary | ICD-10-CM

## 2021-11-20 DIAGNOSIS — M4316 Spondylolisthesis, lumbar region: Secondary | ICD-10-CM | POA: Diagnosis not present

## 2021-11-20 DIAGNOSIS — M48062 Spinal stenosis, lumbar region with neurogenic claudication: Secondary | ICD-10-CM | POA: Insufficient documentation

## 2021-11-20 DIAGNOSIS — Z01818 Encounter for other preprocedural examination: Secondary | ICD-10-CM

## 2021-11-20 HISTORY — PX: LAMINECTOMY WITH POSTERIOR LATERAL ARTHRODESIS LEVEL 3: SHX6337

## 2021-11-20 LAB — ABO/RH: ABO/RH(D): A POS

## 2021-11-20 SURGERY — LAMINECTOMY WITH POSTERIOR LATERAL ARTHRODESIS LEVEL 3
Anesthesia: General

## 2021-11-20 MED ORDER — FENTANYL CITRATE (PF) 100 MCG/2ML IJ SOLN
INTRAMUSCULAR | Status: AC
  Filled 2021-11-20: qty 2

## 2021-11-20 MED ORDER — METHOCARBAMOL 500 MG PO TABS
500.0000 mg | ORAL_TABLET | Freq: Four times a day (QID) | ORAL | Status: DC | PRN
  Administered 2021-11-20 – 2021-11-22 (×5): 500 mg via ORAL
  Filled 2021-11-20 (×5): qty 1

## 2021-11-20 MED ORDER — PANTOPRAZOLE SODIUM 40 MG PO TBEC
40.0000 mg | DELAYED_RELEASE_TABLET | Freq: Every day | ORAL | Status: DC
  Administered 2021-11-21 – 2021-11-23 (×3): 40 mg via ORAL
  Filled 2021-11-20 (×3): qty 1

## 2021-11-20 MED ORDER — LIDOCAINE-EPINEPHRINE 1 %-1:100000 IJ SOLN
INTRAMUSCULAR | Status: DC | PRN
  Administered 2021-11-20: 5 mL

## 2021-11-20 MED ORDER — THROMBIN 5000 UNITS EX SOLR
OROMUCOSAL | Status: DC | PRN
  Administered 2021-11-20 (×2): 5 mL via TOPICAL

## 2021-11-20 MED ORDER — CHLORHEXIDINE GLUCONATE CLOTH 2 % EX PADS: 6.0000 | MEDICATED_PAD | Freq: Once | CUTANEOUS | Status: DC

## 2021-11-20 MED ORDER — 0.9 % SODIUM CHLORIDE (POUR BTL) OPTIME
TOPICAL | Status: DC | PRN
  Administered 2021-11-20: 1000 mL

## 2021-11-20 MED ORDER — ACETAMINOPHEN 325 MG PO TABS
650.0000 mg | ORAL_TABLET | ORAL | Status: DC | PRN
  Administered 2021-11-20 – 2021-11-23 (×6): 650 mg via ORAL
  Filled 2021-11-20 (×6): qty 2

## 2021-11-20 MED ORDER — OXYCODONE HCL 5 MG PO TABS
5.0000 mg | ORAL_TABLET | ORAL | Status: DC | PRN
  Administered 2021-11-20 – 2021-11-23 (×6): 5 mg via ORAL
  Filled 2021-11-20 (×6): qty 1

## 2021-11-20 MED ORDER — ADULT MULTIVITAMIN W/MINERALS CH
1.0000 | ORAL_TABLET | Freq: Every day | ORAL | Status: DC
  Administered 2021-11-20 – 2021-11-23 (×4): 1 via ORAL
  Filled 2021-11-20 (×4): qty 1

## 2021-11-20 MED ORDER — HYDROXYCHLOROQUINE SULFATE 200 MG PO TABS
200.0000 mg | ORAL_TABLET | Freq: Two times a day (BID) | ORAL | Status: DC
  Administered 2021-11-20 – 2021-11-23 (×6): 200 mg via ORAL
  Filled 2021-11-20 (×8): qty 1

## 2021-11-20 MED ORDER — CEFAZOLIN SODIUM-DEXTROSE 2-4 GM/100ML-% IV SOLN
2.0000 g | INTRAVENOUS | Status: AC
  Administered 2021-11-20 (×2): 2 g via INTRAVENOUS

## 2021-11-20 MED ORDER — LIDOCAINE 2% (20 MG/ML) 5 ML SYRINGE
INTRAMUSCULAR | Status: DC | PRN
  Administered 2021-11-20: 60 mg via INTRAVENOUS

## 2021-11-20 MED ORDER — ACETAMINOPHEN 650 MG RE SUPP: 650.0000 mg | RECTAL | Status: DC | PRN

## 2021-11-20 MED ORDER — ONDANSETRON HCL 4 MG/2ML IJ SOLN
INTRAMUSCULAR | Status: AC
  Filled 2021-11-20: qty 2

## 2021-11-20 MED ORDER — LIDOCAINE 2% (20 MG/ML) 5 ML SYRINGE
INTRAMUSCULAR | Status: AC
  Filled 2021-11-20: qty 5

## 2021-11-20 MED ORDER — EPHEDRINE 5 MG/ML INJ
INTRAVENOUS | Status: AC
  Filled 2021-11-20: qty 5

## 2021-11-20 MED ORDER — ALBUMIN HUMAN 5 % IV SOLN: INTRAVENOUS | Status: DC | PRN

## 2021-11-20 MED ORDER — FENTANYL CITRATE (PF) 250 MCG/5ML IJ SOLN
INTRAMUSCULAR | Status: DC | PRN
  Administered 2021-11-20 (×3): 50 ug via INTRAVENOUS
  Administered 2021-11-20: 100 ug via INTRAVENOUS
  Administered 2021-11-20 (×2): 50 ug via INTRAVENOUS

## 2021-11-20 MED ORDER — BUPIVACAINE HCL (PF) 0.5 % IJ SOLN
INTRAMUSCULAR | Status: DC | PRN
  Administered 2021-11-20: 5 mL

## 2021-11-20 MED ORDER — HEMOSTATIC AGENTS (NO CHARGE) OPTIME
TOPICAL | Status: DC | PRN
  Administered 2021-11-20: 1 via TOPICAL

## 2021-11-20 MED ORDER — ROCURONIUM BROMIDE 10 MG/ML (PF) SYRINGE
PREFILLED_SYRINGE | INTRAVENOUS | Status: AC
  Filled 2021-11-20: qty 10

## 2021-11-20 MED ORDER — DOCUSATE SODIUM 100 MG PO CAPS
100.0000 mg | ORAL_CAPSULE | Freq: Two times a day (BID) | ORAL | Status: DC
  Administered 2021-11-20 – 2021-11-23 (×6): 100 mg via ORAL
  Filled 2021-11-20 (×6): qty 1

## 2021-11-20 MED ORDER — BISACODYL 10 MG RE SUPP: 10.0000 mg | Freq: Every day | RECTAL | Status: DC | PRN

## 2021-11-20 MED ORDER — CHLORHEXIDINE GLUCONATE 0.12 % MT SOLN
OROMUCOSAL | Status: AC
  Administered 2021-11-20: 15 mL via OROMUCOSAL
  Filled 2021-11-20: qty 15

## 2021-11-20 MED ORDER — PROSIGHT PO TABS
1.0000 | ORAL_TABLET | Freq: Two times a day (BID) | ORAL | Status: DC
  Administered 2021-11-20 – 2021-11-23 (×6): 1 via ORAL
  Filled 2021-11-20 (×8): qty 1

## 2021-11-20 MED ORDER — FINASTERIDE 5 MG PO TABS
5.0000 mg | ORAL_TABLET | Freq: Every day | ORAL | Status: DC
  Administered 2021-11-20 – 2021-11-23 (×4): 5 mg via ORAL
  Filled 2021-11-20 (×4): qty 1

## 2021-11-20 MED ORDER — SODIUM CHLORIDE 0.9 % IV SOLN: INTRAVENOUS | Status: DC

## 2021-11-20 MED ORDER — THROMBIN 5000 UNITS EX SOLR
CUTANEOUS | Status: AC
  Filled 2021-11-20: qty 5000

## 2021-11-20 MED ORDER — BUPIVACAINE HCL (PF) 0.5 % IJ SOLN
INTRAMUSCULAR | Status: AC
  Filled 2021-11-20: qty 30

## 2021-11-20 MED ORDER — PROPOFOL 10 MG/ML IV BOLUS
INTRAVENOUS | Status: DC | PRN
  Administered 2021-11-20: 120 mg via INTRAVENOUS

## 2021-11-20 MED ORDER — FOLIC ACID 1 MG PO TABS
1.0000 mg | ORAL_TABLET | Freq: Every day | ORAL | Status: DC
  Administered 2021-11-20 – 2021-11-23 (×4): 1 mg via ORAL
  Filled 2021-11-20 (×4): qty 1

## 2021-11-20 MED ORDER — SENNA 8.6 MG PO TABS
1.0000 | ORAL_TABLET | Freq: Two times a day (BID) | ORAL | Status: DC
  Administered 2021-11-20 – 2021-11-23 (×6): 8.6 mg via ORAL
  Filled 2021-11-20 (×6): qty 1

## 2021-11-20 MED ORDER — DEXAMETHASONE SODIUM PHOSPHATE 10 MG/ML IJ SOLN
INTRAMUSCULAR | Status: AC
  Filled 2021-11-20: qty 1

## 2021-11-20 MED ORDER — LACTATED RINGERS IV SOLN: INTRAVENOUS | Status: DC | PRN

## 2021-11-20 MED ORDER — SODIUM CHLORIDE 0.9 % IV SOLN: 250.0000 mL | INTRAVENOUS | Status: DC

## 2021-11-20 MED ORDER — METHOCARBAMOL 1000 MG/10ML IJ SOLN: 500.0000 mg | Freq: Four times a day (QID) | INTRAVENOUS | Status: DC | PRN

## 2021-11-20 MED ORDER — ONDANSETRON HCL 4 MG PO TABS
4.0000 mg | ORAL_TABLET | Freq: Four times a day (QID) | ORAL | Status: DC | PRN
  Administered 2021-11-22: 4 mg via ORAL
  Filled 2021-11-20: qty 1

## 2021-11-20 MED ORDER — CEFAZOLIN SODIUM-DEXTROSE 2-4 GM/100ML-% IV SOLN
2.0000 g | Freq: Three times a day (TID) | INTRAVENOUS | Status: AC
  Administered 2021-11-21: 2 g via INTRAVENOUS
  Filled 2021-11-20 (×2): qty 100

## 2021-11-20 MED ORDER — LACTATED RINGERS IV SOLN: INTRAVENOUS | Status: DC

## 2021-11-20 MED ORDER — SODIUM CHLORIDE 0.9% FLUSH
3.0000 mL | Freq: Two times a day (BID) | INTRAVENOUS | Status: DC
  Administered 2021-11-20 – 2021-11-23 (×6): 3 mL via INTRAVENOUS

## 2021-11-20 MED ORDER — PHENYLEPHRINE HCL-NACL 20-0.9 MG/250ML-% IV SOLN
INTRAVENOUS | Status: DC | PRN
  Administered 2021-11-20: 30 ug/min via INTRAVENOUS

## 2021-11-20 MED ORDER — PHENOL 1.4 % MT LIQD: 1.0000 | OROMUCOSAL | Status: DC | PRN

## 2021-11-20 MED ORDER — CEFAZOLIN SODIUM-DEXTROSE 2-4 GM/100ML-% IV SOLN
INTRAVENOUS | Status: AC
  Administered 2021-11-20: 2 g via INTRAVENOUS
  Filled 2021-11-20: qty 100

## 2021-11-20 MED ORDER — SUCCINYLCHOLINE CHLORIDE 200 MG/10ML IV SOSY
PREFILLED_SYRINGE | INTRAVENOUS | Status: AC
  Filled 2021-11-20: qty 10

## 2021-11-20 MED ORDER — LIDOCAINE-EPINEPHRINE 1 %-1:100000 IJ SOLN
INTRAMUSCULAR | Status: AC
  Filled 2021-11-20: qty 1

## 2021-11-20 MED ORDER — EPHEDRINE SULFATE (PRESSORS) 50 MG/ML IJ SOLN
INTRAMUSCULAR | Status: DC | PRN
  Administered 2021-11-20 (×2): 5 mg via INTRAVENOUS

## 2021-11-20 MED ORDER — ROCURONIUM BROMIDE 10 MG/ML (PF) SYRINGE
PREFILLED_SYRINGE | INTRAVENOUS | Status: DC | PRN
  Administered 2021-11-20: 30 mg via INTRAVENOUS
  Administered 2021-11-20: 10 mg via INTRAVENOUS
  Administered 2021-11-20: 20 mg via INTRAVENOUS
  Administered 2021-11-20: 50 mg via INTRAVENOUS
  Administered 2021-11-20: 20 mg via INTRAVENOUS
  Administered 2021-11-20: 10 mg via INTRAVENOUS

## 2021-11-20 MED ORDER — FENTANYL CITRATE (PF) 250 MCG/5ML IJ SOLN
INTRAMUSCULAR | Status: AC
  Filled 2021-11-20: qty 5

## 2021-11-20 MED ORDER — PROPOFOL 10 MG/ML IV BOLUS
INTRAVENOUS | Status: AC
  Filled 2021-11-20: qty 20

## 2021-11-20 MED ORDER — OXYCODONE HCL 5 MG PO TABS
10.0000 mg | ORAL_TABLET | ORAL | Status: DC | PRN
  Administered 2021-11-20 – 2021-11-22 (×3): 10 mg via ORAL
  Filled 2021-11-20 (×3): qty 2

## 2021-11-20 MED ORDER — CHLORHEXIDINE GLUCONATE 0.12 % MT SOLN: 15.0000 mL | Freq: Once | OROMUCOSAL | Status: AC

## 2021-11-20 MED ORDER — ACETAMINOPHEN 500 MG PO TABS: 1000.0000 mg | ORAL_TABLET | Freq: Once | ORAL | Status: AC

## 2021-11-20 MED ORDER — ONDANSETRON HCL 4 MG/2ML IJ SOLN
4.0000 mg | Freq: Four times a day (QID) | INTRAMUSCULAR | Status: DC | PRN
  Administered 2021-11-21: 4 mg via INTRAVENOUS
  Filled 2021-11-20: qty 2

## 2021-11-20 MED ORDER — MORPHINE SULFATE (PF) 2 MG/ML IV SOLN: 2.0000 mg | INTRAVENOUS | Status: DC | PRN

## 2021-11-20 MED ORDER — ORAL CARE MOUTH RINSE: 15.0000 mL | Freq: Once | OROMUCOSAL | Status: AC

## 2021-11-20 MED ORDER — ONDANSETRON HCL 4 MG/2ML IJ SOLN: 4.0000 mg | Freq: Once | INTRAMUSCULAR | Status: DC | PRN

## 2021-11-20 MED ORDER — FENTANYL CITRATE (PF) 100 MCG/2ML IJ SOLN
25.0000 ug | INTRAMUSCULAR | Status: DC | PRN
  Administered 2021-11-20 (×2): 50 ug via INTRAVENOUS

## 2021-11-20 MED ORDER — SUGAMMADEX SODIUM 200 MG/2ML IV SOLN
INTRAVENOUS | Status: DC | PRN
  Administered 2021-11-20: 200 mg via INTRAVENOUS

## 2021-11-20 MED ORDER — SODIUM CHLORIDE 0.9% FLUSH: 3.0000 mL | INTRAVENOUS | Status: DC | PRN

## 2021-11-20 MED ORDER — OXYCODONE HCL 5 MG PO TABS: 5.0000 mg | ORAL_TABLET | Freq: Once | ORAL | Status: DC | PRN

## 2021-11-20 MED ORDER — DEXAMETHASONE SODIUM PHOSPHATE 10 MG/ML IJ SOLN
INTRAMUSCULAR | Status: DC | PRN
  Administered 2021-11-20: 10 mg via INTRAVENOUS

## 2021-11-20 MED ORDER — OXYCODONE HCL 5 MG/5ML PO SOLN: 5.0000 mg | Freq: Once | ORAL | Status: DC | PRN

## 2021-11-20 MED ORDER — ONDANSETRON HCL 4 MG/2ML IJ SOLN
INTRAMUSCULAR | Status: DC | PRN
  Administered 2021-11-20: 4 mg via INTRAVENOUS

## 2021-11-20 MED ORDER — THROMBIN 5000 UNITS EX SOLR: CUTANEOUS | Status: DC | PRN

## 2021-11-20 MED ORDER — MENTHOL 3 MG MT LOZG: 1.0000 | LOZENGE | OROMUCOSAL | Status: DC | PRN

## 2021-11-20 MED ORDER — ACETAMINOPHEN 500 MG PO TABS
ORAL_TABLET | ORAL | Status: AC
  Administered 2021-11-20: 1000 mg via ORAL
  Filled 2021-11-20: qty 2

## 2021-11-20 SURGICAL SUPPLY — 75 items
BAG COUNTER SPONGE SURGICOUNT (BAG) ×1 IMPLANT
BASKET BONE COLLECTION (BASKET) ×1 IMPLANT
BENZOIN TINCTURE PRP APPL 2/3 (GAUZE/BANDAGES/DRESSINGS) IMPLANT
BLADE BONE MILL MEDIUM (MISCELLANEOUS) IMPLANT
BLADE CLIPPER SURG (BLADE) IMPLANT
BLADE SURG 11 STRL SS (BLADE) ×1 IMPLANT
BUR 14 MATCH 3 (BUR) IMPLANT
BUR MATCHSTICK NEURO 3.0 LAGG (BURR) ×1 IMPLANT
BUR MR8 14CM BALL SYMTRI 5 (BUR) IMPLANT
BUR PRECISION FLUTE 5.0 (BURR) ×1 IMPLANT
BURR 14 MATCH 3 (BUR) ×1
BURR MR8 14CM BALL SYMTRI 5 (BUR) ×2
CANISTER SUCT 3000ML PPV (MISCELLANEOUS) ×1 IMPLANT
CNTNR URN SCR LID CUP LEK RST (MISCELLANEOUS) ×1 IMPLANT
CONT SPEC 4OZ STRL OR WHT (MISCELLANEOUS) ×1
COVER BACK TABLE 60X90IN (DRAPES) ×1 IMPLANT
COVERAGE SUPPORT O-ARM STEALTH (MISCELLANEOUS) ×1 IMPLANT
DERMABOND ADVANCED .7 DNX12 (GAUZE/BANDAGES/DRESSINGS) ×1 IMPLANT
DRAPE C-ARM 42X72 X-RAY (DRAPES) ×1 IMPLANT
DRAPE C-ARMOR (DRAPES) ×1 IMPLANT
DRAPE LAPAROTOMY 100X72X124 (DRAPES) ×1 IMPLANT
DRAPE SHEET LG 3/4 BI-LAMINATE (DRAPES) ×4 IMPLANT
DRAPE SURG 17X23 STRL (DRAPES) ×1 IMPLANT
DRSG OPSITE POSTOP 4X10 (GAUZE/BANDAGES/DRESSINGS) IMPLANT
DURAPREP 26ML APPLICATOR (WOUND CARE) ×1 IMPLANT
ELECT REM PT RETURN 9FT ADLT (ELECTROSURGICAL) ×1
ELECTRODE REM PT RTRN 9FT ADLT (ELECTROSURGICAL) ×1 IMPLANT
FEE COVERAGE SUPPORT O-ARM (MISCELLANEOUS) ×1 IMPLANT
GAUZE 4X4 16PLY ~~LOC~~+RFID DBL (SPONGE) IMPLANT
GAUZE SPONGE 4X4 12PLY STRL (GAUZE/BANDAGES/DRESSINGS) IMPLANT
GLOVE BIO SURGEON STRL SZ7.5 (GLOVE) IMPLANT
GLOVE BIOGEL PI IND STRL 7.5 (GLOVE) ×2 IMPLANT
GLOVE ECLIPSE 7.0 STRL STRAW (GLOVE) ×2 IMPLANT
GLOVE EXAM NITRILE XL STR (GLOVE) IMPLANT
GOWN STRL REUS W/ TWL LRG LVL3 (GOWN DISPOSABLE) ×4 IMPLANT
GOWN STRL REUS W/ TWL XL LVL3 (GOWN DISPOSABLE) IMPLANT
GOWN STRL REUS W/TWL 2XL LVL3 (GOWN DISPOSABLE) IMPLANT
GOWN STRL REUS W/TWL LRG LVL3 (GOWN DISPOSABLE) ×4
GOWN STRL REUS W/TWL XL LVL3 (GOWN DISPOSABLE) ×1
GRAFT BONE PROTEIOS MED 2.5CC (Orthopedic Implant) IMPLANT
HEMOSTAT POWDER KIT SURGIFOAM (HEMOSTASIS) ×1 IMPLANT
KIT BASIN OR (CUSTOM PROCEDURE TRAY) ×1 IMPLANT
KIT POSITION SURG JACKSON T1 (MISCELLANEOUS) ×1 IMPLANT
KIT TURNOVER KIT B (KITS) ×1 IMPLANT
MARKER SPHERE PSV REFLC NDI (MISCELLANEOUS) ×5 IMPLANT
MILL BONE PREP (MISCELLANEOUS) ×1 IMPLANT
NDL HYPO 18GX1.5 BLUNT FILL (NEEDLE) IMPLANT
NDL SPNL 18GX3.5 QUINCKE PK (NEEDLE) IMPLANT
NEEDLE HYPO 18GX1.5 BLUNT FILL (NEEDLE) IMPLANT
NEEDLE HYPO 22GX1.5 SAFETY (NEEDLE) ×1 IMPLANT
NEEDLE SPNL 18GX3.5 QUINCKE PK (NEEDLE) IMPLANT
NS IRRIG 1000ML POUR BTL (IV SOLUTION) ×1 IMPLANT
PACK LAMINECTOMY NEURO (CUSTOM PROCEDURE TRAY) ×1 IMPLANT
PAD ARMBOARD 7.5X6 YLW CONV (MISCELLANEOUS) ×3 IMPLANT
PATTIES SURGICAL 1X1 (DISPOSABLE) IMPLANT
PUTTY GRAFTON DBF 9CC W/DELIVE (Putty) IMPLANT
ROD CVD SOLERA 5.5X90 (Rod) IMPLANT
SCREW SET SOLERA (Screw) ×8 IMPLANT
SCREW SET SOLERA TI5.5 (Screw) IMPLANT
SCREW SOLERA 6.5X50 (Screw) IMPLANT
SCREW SOLERA 7.5X45 (Screw) IMPLANT
SPIKE FLUID TRANSFER (MISCELLANEOUS) ×1 IMPLANT
SPONGE SURGIFOAM ABS GEL 100 (HEMOSTASIS) IMPLANT
SPONGE T-LAP 4X18 ~~LOC~~+RFID (SPONGE) IMPLANT
STRIP CLOSURE SKIN 1/2X4 (GAUZE/BANDAGES/DRESSINGS) IMPLANT
SUT BONE WAX W31G (SUTURE) IMPLANT
SUT VIC AB 0 CT1 18XCR BRD8 (SUTURE) ×1 IMPLANT
SUT VIC AB 0 CT1 8-18 (SUTURE) ×3
SUT VIC AB 2-0 CT1 18 (SUTURE) IMPLANT
SUT VICRYL 3-0 RB1 18 ABS (SUTURE) ×1 IMPLANT
SYR 3ML LL SCALE MARK (SYRINGE) ×3 IMPLANT
TOWEL GREEN STERILE (TOWEL DISPOSABLE) ×1 IMPLANT
TOWEL GREEN STERILE FF (TOWEL DISPOSABLE) ×1 IMPLANT
TRAY FOLEY MTR SLVR 16FR STAT (SET/KITS/TRAYS/PACK) ×1 IMPLANT
WATER STERILE IRR 1000ML POUR (IV SOLUTION) ×1 IMPLANT

## 2021-11-20 NOTE — Op Note (Signed)
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 Lisbeth Renshaw, MD  ASSISTANT: Dr. Monia Pouch, MD  ANESTHESIA: General Endotracheal  EBL: 600cc  SPECIMENS: None  DRAINS: None  COMPLICATIONS: None immediate  CONDITION: Hemodynamically stable to PACU  HISTORY: Joshua Ruiz is a 81 y.o. male initially seen in the outpatient neurosurgery clinic with relatively chronic back and leg pain consistent with neurogenic claudication and multilevel lumbar spondylosis.  Patient had attempted multiple different conservative treatments in the past including anti-inflammatory medication, course of physical therapy, and a series of epidural steroid injections without lasting improvement.  Patient therefore elected to proceed with surgical decompression and fusion.  The risks, benefits, and alternatives to surgery were all reviewed in detail with the patient.  After all his questions were answered informed consent was obtained and witnessed.  PROCEDURE IN DETAIL: The patient was brought to the operating room. After induction of general anesthesia, the patient was positioned on the operative table in the prone position. All pressure points were meticulously padded.  Midline skin incision was then marked out and prepped and draped in the usual sterile fashion.  After timeout was conducted, a spinal needle was introduced in order to identify the surface projection  of the L5 spinous process.  The midline incision was then infiltrated with local anesthetic with epinephrine.  Incision was then opened sharply and carried down through subcutaneous tissue.  The lumbodorsal fascia was then identified and incised in the midline.  Subperiosteal dissection was carried out initially along the L5 spinous process.  A towel clip was placed and lateral intraoperative fluoroscopy was used to confirm correct location.  At this point, subperiosteal dissection was carried out along the lamina at L2-L5.  Dissection was carried laterally across the facet complexes at L2-3, L3-4, and L4-5.  Transverse processes at L2, L3, L4, and L5 were all exposed.  Hemostasis was secured with electrocautery.   After exposure was completed, a spinous process clamp was affixed to the L5 spinous process and connected to the stereotactic array.  Intraoperative CT scan was taken and good accuracy of the navigation system was confirmed.  Utilizing the stereotactic navigation, pilot holes were drilled for standard trajectory pedicle screws bilaterally at L2, L3, L4, and L5.  These were then tapped to 5.5 mm at all levels bilaterally.  Pilot holes were checked with a sound and noted to have good bone circumferentially.  At this point, attention was turned to decompression.  Utilizing the high-speed drill and various Rogers, midline laminectomy was completed at L2, L3, L4, and superior aspect of L5.  Ligamentum flavum was identified.  The facet complexes at L2-3 L3-4 and L4-5 were noted to be quite hypertrophic and causing significant compression of the thecal sac.  The foramina bilaterally at these levels were identified with a blunt nerve hook.  High-speed drill was then used to cut across the pars interarticularis at L2, L3, and L4.  This allowed removal of the inferior articulating process of these levels.  At this point Kerrison punches were used to remove the medial and superior aspect of the superior  articulating process of L3, L4, and L5.  This allowed complete decompression of both the thecal sac and the foramina at these levels.  Of note, the bilateral L2, L3, L4, and traversing L5 nerve roots were all identified and noted to be completely decompressed.  Hemostasis was easily secured with morselized Gelfoam and thrombin.  At this point the high-speed drill was used to decorticate the facet complexes on the lateral aspect as well as the transverse processes at L2, L3, L4, and L5.  This was in preparation for posterolateral arthrodesis.  The drilled and tapped pilot holes were then again identified utilizing the stereotactic system.  6.5 x 50 mm screws were placed bilaterally at L2, L3, and L4 while 7.5 x 45 mm screws were placed at L5.  These were then connected with a pre-bent 90 mm lordotic rod.  Setscrews were then placed and final tightened.  Final AP and lateral fluoroscopic images were taken confirming good location of the pedicle screws.  At this point the bone harvested during decompression was cleaned of any soft tissue, morselized, and mixed with the bone allograft and ProteiOs.  This was then packed into the lateral gutters over the decorticated bone surfaces totaling approximately 60 cc.  At this point the self-retaining retractors were removed.  Hemostasis was secured using bipolar electrocautery.  The wound was then closed in multiple layers in standard fashion using interrupted 0 Vicryl stitches.  The skin was closed with interrupted subcuticular 3 oh stitches and a layer of Dermabond.  After the Dermabond dried, sterile dressing was applied.  At the end of the case all sponge, needle, instrument, and cottonoid counts were correct.  Patient was then transferred to the stretcher and extubated.  He was taken to the postanesthesia care unit in stable hemodynamic condition.   Lisbeth Renshaw, MD [facility]

## 2021-11-20 NOTE — Anesthesia Procedure Notes (Signed)
Procedure Name: Intubation Date/Time: 11/20/2021 8:13 AM  Performed by: Inda Coke, CRNAPre-anesthesia Checklist: Patient identified, Emergency Drugs available, Suction available, Timeout performed and Patient being monitored Patient Re-evaluated:Patient Re-evaluated prior to induction Oxygen Delivery Method: Circle system utilized Preoxygenation: Pre-oxygenation with 100% oxygen Induction Type: IV induction Ventilation: Mask ventilation without difficulty and Oral airway inserted - appropriate to patient size Laryngoscope Size: Mac and 4 Grade View: Grade I Tube type: Oral Tube size: 7.5 mm Number of attempts: 1 Airway Equipment and Method: Stylet Placement Confirmation: ETT inserted through vocal cords under direct vision, positive ETCO2, CO2 detector and breath sounds checked- equal and bilateral Secured at: 23 cm Tube secured with: Tape Dental Injury: Teeth and Oropharynx as per pre-operative assessment

## 2021-11-20 NOTE — Progress Notes (Signed)
Orthopedic Tech Progress Note Patient Details:  Joshua Ruiz 01-25-1940 [id number]  Ortho Devices Type of Ortho Device: Lumbar corsett Ortho Device/Splint Interventions: Ordered     LSO dropped off with PACU RN. Darleen Crocker 11/20/2021, 2:53 PM

## 2021-11-20 NOTE — H&P (Signed)
Chief Complaint   Back and leg pain  History of Present Illness  Joshua Ruiz is a 81 y.o. male initially seen in the outpatient neurosurgery clinic with relatively chronic bilateral buttock and leg pain.  While his back pain has been relatively chronic over several years, he notes worsening of leg symptoms over the last 12 months or so.  Patient describes pain which is brought on primarily when he stands or walks for any length of time.  He describes pain radiating from the buttock through the posterior aspect of the leg and calf.  He describes minimal symptoms when sitting or lying stationary.  He does note worsening of pain with activity.  He has not had any changes in bladder function.  Previously, the patient has undergone a course of physical therapy as well as maintenance of a home exercise program without lasting improvement.  He does take anti-inflammatories with modest improvement and has undergone a series of epidural steroid injections.  The first 1 provided some improvement, with minimal improvement after that.  He therefore presents today for surgical decompression and fusion.  Past Medical History   Past Medical History:  Diagnosis Date   GERD (gastroesophageal reflux disease)    Rheumatoid arthritis (HCC)     Past Surgical History   Past Surgical History:  Procedure Laterality Date   EYE SURGERY Bilateral    cataract extraction   HEMORRHOID SURGERY     LYMPH NODE BIOPSY     Neck   TRIGGER FINGER RELEASE Right    3rd    Social History   Social History   Tobacco Use   Smoking status: Never    Passive exposure: Never   Smokeless tobacco: Never  Vaping Use   Vaping Use: Never used  Substance Use Topics   Alcohol use: Not Currently    Comment: none   Drug use: Never    Medications   Prior to Admission medications   Medication Sig Start Date End Date Taking? Authorizing Provider  acetaminophen (TYLENOL) 325 MG tablet Take 650 mg by mouth every 6 (six)  hours as needed for moderate pain.   Yes [provider]  cetirizine (ZYRTEC) 10 MG tablet Take 10 mg by mouth at bedtime.   Yes [provider]  finasteride (PROSCAR) 5 MG tablet Take 5 mg by mouth daily.   Yes [provider]  folic acid (FOLVITE) 1 MG tablet Take 1 mg by mouth daily.   Yes [provider]  hydroxychloroquine (PLAQUENIL) 200 MG tablet Take 200 mg by mouth 2 (two) times daily.   Yes [provider]  loratadine (CLARITIN) 10 MG tablet Take 10 mg by mouth in the morning.   Yes [provider]  methotrexate (RHEUMATREX) 2.5 MG tablet Take 15 mg by mouth every Saturday. Caution:Chemotherapy. Protect from light.   Yes [provider]  Multiple Vitamin (MULTIVITAMIN WITH MINERALS) TABS tablet Take 1 tablet by mouth daily.   Yes [provider]  Multiple Vitamins-Minerals (PRESERVISION AREDS 2) CAPS Take 1 capsule by mouth in the morning and at bedtime.   Yes [provider]  naproxen sodium (ALEVE) 220 MG tablet Take 220 mg by mouth daily as needed (pain).   Yes [provider]  omeprazole (PRILOSEC) 20 MG capsule Take 20 mg by mouth in the morning and at bedtime.   Yes [provider]  diclofenac (VOLTAREN) 75 MG EC tablet Take 1 tablet (75 mg total) by mouth 2 (two) times daily as needed.  May start taking AFTER finished with the steroid taper Patient not taking: Reported on 11/04/2021 11/28/20   Cristie Hem, PA-C  predniSONE (STERAPRED UNI-PAK 21 TAB) 10 MG (21) TBPK tablet Take as directed.  Do not take with any nsaids Patient not taking: Reported on 11/04/2021 01/13/21   Loyola Mast, MD    Allergies  No Known Allergies  Review of Systems  ROS  Neurologic Exam  Awake, alert, oriented Memory and concentration grossly intact Speech fluent, appropriate CN grossly intact Motor exam: Upper Extremities Deltoid Bicep Tricep Grip  Right 5/5 5/5 5/5 5/5  Left 5/5 5/5 5/5 5/5    Lower Extremities IP Quad PF DF EHL  Right 5/5 5/5 5/5 5/5 5/5  Left 5/5 5/5 5/5 5/5 5/5   Sensation grossly intact to LT  Imaging  MRI of the lumbar spine dated 01/31/2021 was personally reviewed.  This demonstrates maintenance of lumbar lordosis.  There is evidence of some disc desiccation with loss of height primarily seen at L5-S1.  Primary findings at L2-3 L3-4 and L4-5 where the at all 3 levels there is a component of broad-based disc bulge however there is severe spinal stenosis at all 3 levels primarily related to severe facet arthropathy and ligamentous hypertrophy.  There is grade 1 degenerative anterolisthesis at L4-5.  Impression  - 81 y.o. male with symptoms primarily of neurogenic claudication related to stenosis L2-L5 related primarily to facet arthropathy and ligamentous redundancy.  He has failed reasonable conservative treatment.  Plan  -We will plan on proceeding with operative decompression including laminectomy and facetectomy L2-L5 with pedicle screw instrumentation and posterolateral arthrodesis.  We will plan to forego discectomy and interbody arthrodesis.  I have reviewed the indications for the surgical procedure as well as the details of the procedure and the expected postoperative course and recovery at length with the patient in the office. We have also reviewed in detail the risks, benefits, and alternatives to the procedure. All questions were answered and Gwynne Edinger provided informed consent to proceed.   Lisbeth Renshaw, MD [facility]

## 2021-11-20 NOTE — Transfer of Care (Signed)
Immediate Anesthesia Transfer of Care Note  Patient: Joshua Ruiz  Procedure(s) Performed: LUMBAR TWO THROUGH LUMBAR THREE, LUMBAR THREE THROUGH LUMBAR FOUR, LUMBAR FOUR THROUGH LUMBAR FIVE LAMINECTOMY WITH FACETECTOMY, POSTERIOR SEGMENTAL INSTRUMENTATION, POSTEROLATERAL ARTHRODESIS Application of O-Arm  Patient Location: PACU  Anesthesia Type:General  Level of Consciousness: awake and drowsy  Airway & Oxygen Therapy: Patient Spontanous Breathing and Patient connected to face mask oxygen  Post-op Assessment: Report given to RN, Post -op Vital signs reviewed and stable, and Patient moving all extremities X 4  Post vital signs: Reviewed and stable  Last Vitals:  Vitals Value Taken Time  BP    Temp    Pulse    Resp    SpO2      Last Pain:  Vitals:   11/20/21 0614  TempSrc:   PainSc: 0-No pain      Patients Stated Pain Goal: 0 (11/20/21 7619)  Complications: No notable events documented.

## 2021-11-20 NOTE — Anesthesia Postprocedure Evaluation (Signed)
Anesthesia Post Note  Patient: Joshua Ruiz  Procedure(s) Performed: LUMBAR TWO THROUGH LUMBAR THREE, LUMBAR THREE THROUGH LUMBAR FOUR, LUMBAR FOUR THROUGH LUMBAR FIVE LAMINECTOMY WITH FACETECTOMY, POSTERIOR SEGMENTAL INSTRUMENTATION, POSTEROLATERAL ARTHRODESIS Application of O-Arm     Patient location during evaluation: PACU Anesthesia Type: General Level of consciousness: awake and alert Pain management: pain level controlled Vital Signs Assessment: post-procedure vital signs reviewed and stable Respiratory status: spontaneous breathing, nonlabored ventilation, respiratory function stable and patient connected to nasal cannula oxygen Cardiovascular status: blood pressure returned to baseline and stable Postop Assessment: no apparent nausea or vomiting Anesthetic complications: no   No notable events documented.  Last Vitals:  Vitals:   11/20/21 1445 11/20/21 1500  BP: (!) 150/71   Pulse: 75 81  Resp: 17 14  Temp:    SpO2: 94% 95%    Last Pain:  Vitals:   11/20/21 1445  TempSrc:   PainSc: 5                  Beryle Lathe

## 2021-11-21 DIAGNOSIS — M48062 Spinal stenosis, lumbar region with neurogenic claudication: Secondary | ICD-10-CM | POA: Diagnosis not present

## 2021-11-21 DIAGNOSIS — M4316 Spondylolisthesis, lumbar region: Secondary | ICD-10-CM | POA: Diagnosis not present

## 2021-11-21 DIAGNOSIS — M47896 Other spondylosis, lumbar region: Secondary | ICD-10-CM | POA: Diagnosis not present

## 2021-11-21 NOTE — Progress Notes (Signed)
   Providing Compassionate, Quality Care - Together  NEUROSURGERY PROGRESS NOTE   S: No issues overnight.  States his preoperative radiculopathy and claudication is significantly improved  O: EXAM:  BP (!) 100/57 (BP Location: Right Arm)   Pulse 74   Temp 98.9 F (37.2 C) (Axillary)   Resp 18   Ht 5\' 8"  (1.727 m)   Wt 89.4 kg   SpO2 94%   BMI 29.95 kg/m   Awake, alert, oriented x3 PERRL Speech fluent, appropriate  CNs grossly intact  5/5 BUE/BLE  Incision clean dry and intact  ASSESSMENT:  81 y.o. male with   L2-5 lumbar stenosis with claudication radiculopathy  -Status post open L2-5 lumbar decompression, posterior spinal instrumentation and fusion  PLAN: -PT/OT -Pain control -Overall doing well    Thank you for allowing me to participate in this patient's care.  Please do not hesitate to call with questions or concerns.   94, DO Neurosurgeon [facility] Cell: [phone]

## 2021-11-21 NOTE — Care Management Obs Status (Signed)
MEDICARE OBSERVATION STATUS NOTIFICATION   Patient Details  Name: Joshua Ruiz MRN: [id number] Date of Birth: 28-Jul-1940   Medicare Observation Status Notification Given:  Yes    Bess Kinds, RN 11/21/2021, 11:57 AM

## 2021-11-21 NOTE — Progress Notes (Addendum)
Physical Therapy Evaluation Patient Details Name: Joshua Ruiz MRN: [id number] DOB: 03/08/1940 Today's Date: 11/21/2021  History of Present Illness  81 yo M admitted 11/10 for elective lumbar surgery,  s/p PLIF.  PMH includes: GERD and RA.  Clinical Impression  Pt admitted with/for elective lumbar fusion surgery.  Pt needing minimal assist overall for basic mobility and gait.  Pt currently limited functionally due to the problems listed below.  (see problems list.)  Pt will benefit from PT to maximize function and safety to be able to get home safely with available assist .         Recommendations for follow up therapy are one component of a multi-disciplinary discharge planning process, led by the attending physician.  Recommendations may be updated based on patient status, additional functional criteria and insurance authorization.  Follow Up Recommendations No PT follow up      Assistance Recommended at Discharge PRN  Patient can return home with the following  A little help with bathing/dressing/bathroom;Assistance with cooking/housework;Assist for transportation;Help with stairs or ramp for entrance    Equipment Recommendations None recommended by PT  Recommendations for Other Services       Functional Status Assessment Patient has had a recent decline in their functional status and demonstrates the ability to make significant improvements in function in a reasonable and predictable amount of time.     Precautions / Restrictions Precautions Precautions: Back Precaution Comments: reviewed verbally Required Braces or Orthoses: Spinal Brace Spinal Brace: Applied in sitting position      Mobility  Bed Mobility Overal bed mobility: Needs Assistance Bed Mobility: Rolling, Sidelying to Sit Rolling: Min guard Sidelying to sit: Min assist     Sit to sidelying: Min guard General bed mobility comments: cues for technique/sequencing.  truncal assist for rolling and  transition via L elbow.    Transfers Overall transfer level: Needs assistance Equipment used: Rolling walker (2 wheels) Transfers: Sit to/from Stand Sit to Stand: Min assist (from lower surface)                Ambulation/Gait Ambulation/Gait assistance: Min guard Gait Distance (Feet): 100 Feet Assistive device: Rolling walker (2 wheels) Gait Pattern/deviations: Step-through pattern   Gait velocity interpretation: <1.8 ft/sec, indicate of risk for recurrent falls   General Gait Details: generally steady though guarded and slower.  light to moderate use of the RW  Stairs            Wheelchair Mobility    Modified Rankin (Stroke Patients Only)       Balance Overall balance assessment: Needs assistance Sitting-balance support: Feet supported Sitting balance-Leahy Scale: Good       Standing balance-Leahy Scale: Poor Standing balance comment: still reliant on the AD and some external support                             Pertinent Vitals/Pain Pain Assessment Pain Assessment: Faces Faces Pain Scale: Hurts little more Pain Location: Incisional Pain Descriptors / Indicators: Guarding, Grimacing Pain Intervention(s): Monitored during session    Home Living Family/patient expects to be discharged to:: Private residence Living Arrangements: Alone Available Help at Discharge: Family;Available PRN/intermittently Type of Home: Other(Comment) Home Access: Level entry       Home Layout: One level Home Equipment: Agricultural consultant (2 wheels);Shower seat      Prior Function Prior Level of Function : Independent/Modified Independent;Driving  Hand Dominance   Dominant Hand: Right    Extremity/Trunk Assessment   Upper Extremity Assessment Upper Extremity Assessment: Overall WFL for tasks assessed    Lower Extremity Assessment Lower Extremity Assessment: Generalized weakness;Overall [facility] for tasks assessed    Cervical  / Trunk Assessment Cervical / Trunk Assessment: Back Surgery;Kyphotic  Communication   Communication: No difficulties  Cognition Arousal/Alertness: Awake/alert Behavior During Therapy: WFL for tasks assessed/performed Overall Cognitive Status: Within Functional Limits for tasks assessed                                          General Comments General comments (skin integrity, edema, etc.): Reinforced back care/prec, log roll, transitions from sidelying, bracing issues, lifting restrictions and progression of gait post d/c.    Exercises     Assessment/Plan    PT Assessment Patient needs continued PT services  PT Problem List Decreased strength;Decreased activity tolerance;Decreased mobility;Decreased knowledge of use of DME;Pain       PT Treatment Interventions DME instruction;Gait training;Stair training;Functional mobility training;Therapeutic activities;Patient/family education    PT Goals (Current goals can be found in the Care Plan section)  Acute Rehab PT Goals Patient Stated Goal: home independent PT Goal Formulation: With patient Time For Goal Achievement: 12/05/21 Potential to Achieve Goals: Good    Frequency Min 5X/week     Co-evaluation               AM-PAC PT "6 Clicks" Mobility  Outcome Measure Help needed turning from your back to your side while in a flat bed without using bedrails?: A Little Help needed moving from lying on your back to sitting on the side of a flat bed without using bedrails?: A Little Help needed moving to and from a bed to a chair (including a wheelchair)?: A Little Help needed standing up from a chair using your arms (e.g., wheelchair or bedside chair)?: A Little Help needed to walk in hospital room?: A Little Help needed climbing 3-5 steps with a railing? : A Little 6 Click Score: 18    End of Session Equipment Utilized During Treatment: Back brace Activity Tolerance: Patient tolerated treatment  well Patient left: in bed;with call bell/phone within reach Nurse Communication: Mobility status PT Visit Diagnosis: Other abnormalities of gait and mobility (R26.89);Pain Pain - part of body:  (back)    Time: 1340-1404 PT Time Calculation (min) (ACUTE ONLY): 24 min   Charges:   PT Evaluation $PT Eval Moderate Complexity: 1 Mod PT Treatments $Gait Training: 8-22 mins        11/21/2021  Jacinto Halim., PT Acute Rehabilitation Services [phone]  (office)  Eliseo Gum Aspen Lawrance 11/21/2021, 4:27 PM

## 2021-11-21 NOTE — Care Management CC44 (Signed)
Condition Code 44 Documentation Completed  Patient Details  Name: Burton Gahan MRN: [id number] Date of Birth: 02-11-1940   Condition Code 44 given:  Yes Patient signature on Condition Code 44 notice:  Yes Documentation of 2 MD's agreement:  Yes Code 44 added to claim:  Yes    Bess Kinds, RN 11/21/2021, 11:57 AM

## 2021-11-21 NOTE — Progress Notes (Signed)
Pt foley removed at 0700, pt due to void. Foley tip intact but had some small blood clot on it. Perineal care completed. Reported off to oncoming RN. Dionne Bucy RN

## 2021-11-21 NOTE — Evaluation (Signed)
Occupational Therapy Evaluation Patient Details Name: Joshua Ruiz MRN: [id number] DOB: 12-13-1940 Today's Date: 11/21/2021   History of Present Illness 81 yo M s/p PLIF.  PMH includes: GERD and RA.   Clinical Impression   Patient admitted for the procedure above.  PTA he lives home alone, but has a son that lives nearby and can assist PRN.  Pain and stiffness are the primary deficits.  Currently needing Min A for mobility at RW level and ADL completion.  OT will follow, and at this point no post acute OT is anticipated.  HH OT could be considered depending on progress.       Recommendations for follow up therapy are one component of a multi-disciplinary discharge planning process, led by the attending physician.  Recommendations may be updated based on patient status, additional functional criteria and insurance authorization.   Follow Up Recommendations  No OT follow up    Assistance Recommended at Discharge Set up Supervision/Assistance  Patient can return home with the following Assist for transportation;Assistance with cooking/housework    Functional Status Assessment  Patient has had a recent decline in their functional status and demonstrates the ability to make significant improvements in function in a reasonable and predictable amount of time.  Equipment Recommendations  None recommended by OT    Recommendations for Other Services       Precautions / Restrictions Precautions Precautions: Back Precaution Booklet Issued: No Precaution Comments: reviewed verbally Required Braces or Orthoses: Spinal Brace Spinal Brace: Applied in sitting position Restrictions Weight Bearing Restrictions: No      Mobility Bed Mobility Overal bed mobility: Needs Assistance Bed Mobility: Sit to Sidelying         Sit to sidelying: Min assist      Transfers Overall transfer level: Needs assistance Equipment used: Rolling walker (2 wheels) Transfers: Sit to/from Stand, Bed  to chair/wheelchair/BSC Sit to Stand: Min guard, Min assist     Step pivot transfers: Min guard, Min assist     General transfer comment: Currenlty with balance back on his heels      Balance Overall balance assessment: Needs assistance Sitting-balance support: Feet supported Sitting balance-Leahy Scale: Good     Standing balance support: Reliant on assistive device for balance Standing balance-Leahy Scale: Poor                             ADL either performed or assessed with clinical judgement   ADL       Grooming: Wash/dry hands;Wash/dry face;Set up;Sitting       Lower Body Bathing: Minimal assistance;Sit to/from stand       Lower Body Dressing: Minimal assistance;Sit to/from stand   Toilet Transfer: Minimal assistance;Rolling walker (2 wheels);Ambulation                   Vision Baseline Vision/History: 1 Wears glasses Patient Visual Report: No change from baseline       Perception Perception Perception: Within Functional Limits   Praxis Praxis Praxis: Intact    Pertinent Vitals/Pain Pain Assessment Pain Assessment: Faces Faces Pain Scale: Hurts little more Pain Location: Incisional Pain Descriptors / Indicators: Guarding, Grimacing Pain Intervention(s): Monitored during session     Hand Dominance Right   Extremity/Trunk Assessment Upper Extremity Assessment Upper Extremity Assessment: Overall WFL for tasks assessed   Lower Extremity Assessment Lower Extremity Assessment: Defer to PT evaluation   Cervical / Trunk Assessment Cervical / Trunk Assessment: Back Surgery;Kyphotic  Communication Communication Communication: No difficulties   Cognition Arousal/Alertness: Awake/alert Behavior During Therapy: WFL for tasks assessed/performed Overall Cognitive Status: Within Functional Limits for tasks assessed                                       General Comments   BP 110/60    Exercises     Shoulder  Instructions      Home Living Family/patient expects to be discharged to:: Private residence Living Arrangements: Alone Available Help at Discharge: Family;Available PRN/intermittently Type of Home: Other(Comment) (Townhome) Home Access: Level entry     Home Layout: One level     Bathroom Shower/Tub: Producer, television/film/video: Handicapped height Bathroom Accessibility: Yes How Accessible: Accessible via walker Home Equipment: Rolling Walker (2 wheels);Shower seat          Prior Functioning/Environment Prior Level of Function : Independent/Modified Independent;Driving                        OT Problem List: Impaired balance (sitting and/or standing);Decreased activity tolerance;Pain      OT Treatment/Interventions: Self-care/ADL training;Therapeutic activities;Patient/family education;DME and/or AE instruction;Balance training    OT Goals(Current goals can be found in the care plan section) Acute Rehab OT Goals Patient Stated Goal: Return home OT Goal Formulation: With patient Time For Goal Achievement: 12/04/21 Potential to Achieve Goals: Good ADL Goals Pt Will Perform Grooming: with modified independence;standing Pt Will Perform Lower Body Dressing: with modified independence;sit to/from stand;with adaptive equipment Pt Will Transfer to Toilet: with modified independence;regular height toilet;ambulating  OT Frequency: Min 2X/week    Co-evaluation              AM-PAC OT "6 Clicks" Daily Activity     Outcome Measure Help from another person eating meals?: None Help from another person taking care of personal grooming?: None Help from another person toileting, which includes using toliet, bedpan, or urinal?: A Little Help from another person bathing (including washing, rinsing, drying)?: A Little Help from another person to put on and taking off regular upper body clothing?: None Help from another person to put on and taking off regular lower  body clothing?: A Little 6 Click Score: 21   End of Session Equipment Utilized During Treatment: Rolling walker (2 wheels) Nurse Communication: Mobility status  Activity Tolerance: Patient tolerated treatment well Patient left: in bed;with call bell/phone within reach  OT Visit Diagnosis: Unsteadiness on feet (R26.81);Pain Pain - Right/Left:  (low back)                Time: 7017-7939 OT Time Calculation (min): 25 min Charges:  OT General Charges $OT Visit: 1 Visit OT Evaluation $OT Eval Moderate Complexity: 1 Mod OT Treatments $Self Care/Home Management : 8-22 mins  11/21/2021  RP, OTR/L  Acute Rehabilitation Services  Office:  [phone]   Suzanna Obey 11/21/2021, 10:06 AM

## 2021-11-22 DIAGNOSIS — M48062 Spinal stenosis, lumbar region with neurogenic claudication: Secondary | ICD-10-CM | POA: Diagnosis not present

## 2021-11-22 DIAGNOSIS — M47896 Other spondylosis, lumbar region: Secondary | ICD-10-CM | POA: Diagnosis not present

## 2021-11-22 DIAGNOSIS — M4316 Spondylolisthesis, lumbar region: Secondary | ICD-10-CM | POA: Diagnosis not present

## 2021-11-22 NOTE — Progress Notes (Signed)
   Providing Compassionate, Quality Care - Together  NEUROSURGERY PROGRESS NOTE   S: No issues overnight.  Pain appropriately controlled, passing flatus  O: EXAM:  BP 122/66   Pulse 77   Temp 98.8 F (37.1 C)   Resp 20   Ht 5\' 8"  (1.727 m)   Wt 89.4 kg   SpO2 97%   BMI 29.95 kg/m   Awake, alert, oriented x3 PERRL Speech fluent, appropriate  CNs grossly intact  5/5 BUE/BLE  Incision clean dry and intact   ASSESSMENT:  81 y.o. 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  Please do not hesitate to call with questions or concerns.   94, DO Neurosurgeon [facility] Cell: [phone]

## 2021-11-22 NOTE — Progress Notes (Signed)
  Transition of Care [facility]) Screening Note   Patient Details  Name: Naol Ontiveros Date of Birth: 05/16/1940   Transition of Care [facility]) CM/SW Contact:    Bess Kinds, RN Phone Number: [phone] 11/22/2021, 7:55 AM  TOC consult acknowledge for DME/HH needs - PT/OT recommendations for no follow up and no DME recommendations.   Transition of Care Department [facility]) has reviewed patient and no TOC needs have been identified at this time. We will continue to monitor patient advancement through interdisciplinary progression rounds. If new patient transition needs arise, please place a TOC consult.

## 2021-11-22 NOTE — Progress Notes (Signed)
Physical Therapy Treatment Patient Details Name: Nickalus Thornsberry MRN: [id number] DOB: [birth year] Today's Date: 11/22/2021   History of Present Illness 81 yo M admitted 11/10 for elective lumbar surgery, s/p PLIF L2-3, L3-4, and L4-5.  PMH includes: GERD and RA.    PT Comments    The pt was agreeable to session and able to demo good progression of ambulation distance and independence compared to yesterday's session. The pt continues to need slight assist to complete bed mobility using log roll technique, and needs assist to rise from low surfaces. He is able to rise from slightly elevated surfaces (similar to height of his bed at home) with minG and use of RW, and we discussed how his son or daughter in law could assist with bed mobility from flat bed. Pt also planning to get lift recliner he could sleep in to reduce need for bed mobility at this time. Given the pt still demos some strength and power deficits in LE that limit his ability to complete sit-stand transfers from low surfaces and deficits in stability requiring RW, I have updated recommendation to HHPT, but the pt is still safe to return home with family assist when medically stable.    Recommendations for follow up therapy are one component of a multi-disciplinary discharge planning process, led by the attending physician.  Recommendations may be updated based on patient status, additional functional criteria and insurance authorization.  Follow Up Recommendations  Home health PT     Assistance Recommended at Discharge PRN  Patient can return home with the following A little help with bathing/dressing/bathroom;Assistance with cooking/housework;Assist for transportation;Help with stairs or ramp for entrance   Equipment Recommendations  None recommended by PT    Recommendations for Other Services       Precautions / Restrictions Precautions Precautions: Back Precaution Booklet Issued: Yes (comment) Precaution Comments:  reviewed verbally Required Braces or Orthoses: Spinal Brace Spinal Brace: Applied in sitting position Restrictions Weight Bearing Restrictions: No     Mobility  Bed Mobility Overal bed mobility: Needs Assistance Bed Mobility: Rolling, Sidelying to Sit Rolling: Min assist Sidelying to sit: Min assist       General bed mobility comments: cues fro rolling, assist to complete roll without use of bed rails, and minA to elevate trunk. discussed how pt's son could assist with this transfer    Transfers Overall transfer level: Needs assistance Equipment used: Rolling walker (2 wheels) Transfers: Sit to/from Stand Sit to Stand: Min assist, Min guard           General transfer comment: minA from low bed, minG from bed that mimics height of pts bed at home    Ambulation/Gait Ambulation/Gait assistance: Min guard, Supervision Gait Distance (Feet): 250 Feet Assistive device: Rolling walker (2 wheels) Gait Pattern/deviations: Step-through pattern, Trunk flexed Gait velocity: 0.23 m/s Gait velocity interpretation: <1.31 ft/sec, indicative of household ambulator   General Gait Details: pt with slow but steady gait. trunk flexed. no assist to steady or assist      Balance Overall balance assessment: Needs assistance Sitting-balance support: Feet supported Sitting balance-Leahy Scale: Good     Standing balance support: Reliant on assistive device for balance Standing balance-Leahy Scale: Poor Standing balance comment: can static stand without assist, but BUE support for gait                            Cognition Arousal/Alertness: Awake/alert Behavior During Therapy: WFL for tasks assessed/performed Overall Cognitive  Status: Within Functional Limits for tasks assessed                                 General Comments: good recall of spinal precautions, instructions needed for log roll        Exercises      General Comments General comments  (skin integrity, edema, etc.): discussed back precautions, brace, changing position, and using family to assist with bed mobility to maintain precautions      Pertinent Vitals/Pain Pain Assessment Pain Assessment: Faces Faces Pain Scale: Hurts little more Pain Location: Incisional Pain Descriptors / Indicators: Guarding, Grimacing Pain Intervention(s): Monitored during session, Limited activity within patient's tolerance, Repositioned     PT Goals (current goals can now be found in the care plan section) Acute Rehab PT Goals Patient Stated Goal: home independent PT Goal Formulation: With patient Time For Goal Achievement: 12/05/21 Potential to Achieve Goals: Good Progress towards PT goals: Progressing toward goals    Frequency    Min 5X/week      PT Plan Discharge plan needs to be updated       AM-PAC PT "6 Clicks" Mobility   Outcome Measure  Help needed turning from your back to your side while in a flat bed without using bedrails?: A Little Help needed moving from lying on your back to sitting on the side of a flat bed without using bedrails?: A Little Help needed moving to and from a bed to a chair (including a wheelchair)?: A Little Help needed standing up from a chair using your arms (e.g., wheelchair or bedside chair)?: A Little Help needed to walk in hospital room?: A Little Help needed climbing 3-5 steps with a railing? : A Little 6 Click Score: 18    End of Session Equipment Utilized During Treatment: Back brace Activity Tolerance: Patient tolerated treatment well Patient left: in chair;with call bell/phone within reach Nurse Communication: Mobility status PT Visit Diagnosis: Other abnormalities of gait and mobility (R26.89);Pain Pain - part of body:  (back)     Time: 2620-3559 PT Time Calculation (min) (ACUTE ONLY): 24 min  Charges:  $Gait Training: 8-22 mins $Therapeutic Exercise: 8-22 mins                     Vickki Muff, PT, DPT   Acute  Rehabilitation Department   Ronnie Derby 11/22/2021, 9:00 AM

## 2021-11-22 NOTE — Plan of Care (Signed)

## 2021-11-23 ENCOUNTER — Other Ambulatory Visit (HOSPITAL_COMMUNITY): Payer: Self-pay

## 2021-11-23 DIAGNOSIS — M48062 Spinal stenosis, lumbar region with neurogenic claudication: Secondary | ICD-10-CM | POA: Diagnosis not present

## 2021-11-23 DIAGNOSIS — M47896 Other spondylosis, lumbar region: Secondary | ICD-10-CM | POA: Diagnosis not present

## 2021-11-23 DIAGNOSIS — M4316 Spondylolisthesis, lumbar region: Secondary | ICD-10-CM | POA: Diagnosis not present

## 2021-11-23 MED ORDER — OXYCODONE HCL 10 MG PO TABS
10.0000 mg | ORAL_TABLET | Freq: Four times a day (QID) | ORAL | 0 refills | Status: AC | PRN
  Filled 2021-11-23: qty 28, 7d supply, fill #0

## 2021-11-23 MED ORDER — METHOCARBAMOL 500 MG PO TABS
500.0000 mg | ORAL_TABLET | Freq: Four times a day (QID) | ORAL | 0 refills | Status: DC | PRN
  Filled 2021-11-23: qty 90, 23d supply, fill #0

## 2021-11-23 MED FILL — Thrombin For Soln 5000 Unit: CUTANEOUS | Qty: 5000 | Status: AC

## 2021-11-23 NOTE — Discharge Summary (Addendum)
Physician Discharge Summary  Patient ID: Joshua Ruiz MRN: [id number] DOB/AGE: 1940/04/06 81 y.o.  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 male admitted after L2-L5 decompression and fusion. He was at baseline postop with significant improvement in preoperative claudication symptoms. He was ambulating well, tolerating diet, voiding normally and requested discharge on POD#3 with home health PT.  Treatments: Surgery - L2-L5 decompression, posterolateral instrumented fusion  Discharge Exam: Blood pressure (!) 151/72, pulse 72, temperature 98.8 F (37.1 C), temperature source Oral, resp. rate 19, height 5\' 8"  (1.727 m), weight 89.4 kg, SpO2 97 %. Awake, alert, oriented Speech fluent, appropriate CN grossly intact 5/5 BUE/BLE Wound c/d/i  Disposition: Discharge disposition: 01-Home or Self Care       Discharge Instructions     Call MD for:  redness, tenderness, or signs of infection (pain, swelling, redness, odor or green/yellow discharge around incision site)   Complete by: As directed    Call MD for:  redness, tenderness, or signs of infection (pain, swelling, redness, odor or green/yellow discharge around incision site)   Complete by: As directed    Call MD for:  temperature >100.4   Complete by: As directed    Call MD for:  temperature >100.4   Complete by: As directed    Diet - low sodium heart healthy   Complete by: As directed    Discharge instructions   Complete by: As directed    Walk at home as much as possible, at least 4 times / day   Discharge instructions   Complete by: As directed    Walk at home as much as possible, at least 4 times / day   Incentive spirometry RT    Complete by: As directed    Increase activity slowly   Complete by: As directed    Increase activity slowly   Complete by: As directed    Lifting restrictions   Complete by: As directed    No lifting > 10 lbs   Lifting restrictions   Complete by: As directed    No lifting > 10 lbs   May shower / Bathe   Complete by: As directed    48 hours after surgery   May shower / Bathe   Complete by: As directed    48 hours after surgery   May walk up steps   Complete by: As directed    May walk up steps   Complete by: As directed    No dressing needed   Complete by: As directed    No dressing needed   Complete by: As directed    Other Restrictions   Complete by: As directed    No bending/twisting at waist   Other Restrictions   Complete by: As directed    No bending/twisting at waist      Allergies as of 11/23/2021   No Known Allergies      Medication List     STOP taking these medications    diclofenac 75 MG EC tablet Commonly known as: VOLTAREN   predniSONE 10 MG (21) Tbpk tablet Commonly known as: STERAPRED UNI-PAK 21 TAB       TAKE these medications    acetaminophen 325 MG tablet Commonly known as: TYLENOL Take 650 mg by mouth every  6 (six) hours as needed for moderate pain.   cetirizine 10 MG tablet Commonly known as: ZYRTEC Take 10 mg by mouth at bedtime.   finasteride 5 MG tablet Commonly known as: PROSCAR Take 5 mg by mouth daily.   folic acid 1 MG tablet Commonly known as: FOLVITE Take 1 mg by mouth daily.   hydroxychloroquine 200 MG tablet Commonly known as: PLAQUENIL Take 200 mg by mouth 2 (two) times daily.   loratadine 10 MG tablet Commonly known as: CLARITIN Take 10 mg by mouth in the morning.   methocarbamol 500 MG tablet Commonly known as: ROBAXIN Take 1 tablet (500 mg total) by mouth every 6 (six) hours as needed for muscle spasms.   methotrexate 2.5 MG tablet Commonly known as: RHEUMATREX Take 15 mg by mouth every Saturday.  Caution:Chemotherapy. Protect from light.   multivitamin with minerals Tabs tablet Take 1 tablet by mouth daily.   naproxen sodium 220 MG tablet Commonly known as: ALEVE Take 220 mg by mouth daily as needed (pain).   omeprazole 20 MG capsule Commonly known as: PRILOSEC Take 20 mg by mouth in the morning and at bedtime.   Oxycodone HCl 10 MG Tabs Take 1 tablet (10 mg total) by mouth every 6 (six) hours as needed for up to 7 days for severe pain ((score 7 to 10)).   PreserVision AREDS 2 Caps Take 1 capsule by mouth in the morning and at bedtime.               Discharge Care Instructions  (From admission, onward)           Start     Ordered   11/23/21 0000  No dressing needed        11/23/21 1235   11/23/21 0000  No dressing needed        11/23/21 1240            Follow-up Information     Lisbeth Renshaw, MD Follow up.   Specialty: Neurosurgery Contact information: [address] [place] [address] [phone]                 Signed: Jackelyn Hoehn 11/23/2021, 12:40 PM

## 2021-11-23 NOTE — Progress Notes (Signed)
Pt with d/c order home with home health. PIVs removed. Assessment and VS documented pt is stable. DME and medications delivered to room. Discharge packet and education provided to pt all questions answered. Pt transported via wheelchair to private vehicle.

## 2021-11-23 NOTE — Progress Notes (Signed)
Mobility Specialist Progress Note   11/23/21 1527  Mobility  Activity Ambulated with assistance in hallway  Level of Assistance Minimal assist, patient does 75% or more  Assistive Device Front wheel walker  Distance Ambulated (ft) 200 ft  Activity Response Tolerated well  $Mobility charge 1 Mobility   Pt having little to no pain in back and agreeable. MinA to stand d/t "stiffness" but Min guard for ambulation. Returned back to chair where pt's back brace was readjusted. Pt awaiting d/c now.   Frederico Hamman Mobility Specialist Acute Rehab Office:  [phone]

## 2021-11-23 NOTE — Progress Notes (Signed)
Physical Therapy Treatment Patient Details Name: Joshua Ruiz MRN: [id number] DOB: 26-May-1940 Today's Date: 11/23/2021   History of Present Illness 81 yo M admitted 11/10 for elective lumbar surgery, s/p PLIF L2-3, L3-4, and L4-5.  PMH includes: GERD and RA.    PT Comments    Pt improving well, but not confidently at a mod I level.  Have asked him to have family stay with him a few days and /or check on frequently.  Emphasis on scooting, safe sit to stand technique, progression of gait stability, speed and improved posture with scanning for balance challenge and safe negotiation of stairs with a rail.    Recommendations for follow up therapy are one component of a multi-disciplinary discharge planning process, led by the attending physician.  Recommendations may be updated based on patient status, additional functional criteria and insurance authorization.  Follow Up Recommendations        Assistance Recommended at Discharge Intermittent Supervision/Assistance  Patient can return home with the following A little help with bathing/dressing/bathroom;Assistance with cooking/housework;Assist for transportation;Help with stairs or ramp for entrance   Equipment Recommendations  None recommended by PT    Recommendations for Other Services       Precautions / Restrictions Precautions Precautions: Back Precaution Comments: reviewed verbally Required Braces or Orthoses: Spinal Brace Spinal Brace: Applied in sitting position Restrictions Weight Bearing Restrictions: Yes     Mobility  Bed Mobility               General bed mobility comments: up on arrival    Transfers Overall transfer level: Needs assistance Equipment used: Rolling walker (2 wheels) Transfers: Sit to/from Stand Sit to Stand: Min assist (stiff from sitting)           General transfer comment: assist needed to scoot to edge of the chair and boost from the chair.    Ambulation/Gait Ambulation/Gait  assistance: Min guard Gait Distance (Feet): 200 Feet Assistive device: Rolling walker (2 wheels) Gait Pattern/deviations: Step-through pattern, Trunk flexed   Gait velocity interpretation: <1.8 ft/sec, indicate of risk for recurrent falls   General Gait Details: pt slow and mildly unsteady with drift, especially when scanning. min to mod use of the RW.  Emphasis on increased speeds and improved posture.   Stairs Stairs: Yes Stairs assistance: Min assist Stair Management: One rail Right, Step to pattern, Forwards, Sideways (two hands to a rail) Number of Stairs: 4 General stair comments: best with some assist and rail   Wheelchair Mobility    Modified Rankin (Stroke Patients Only)       Balance Overall balance assessment: Needs assistance Sitting-balance support: Feet supported Sitting balance-Leahy Scale: Good     Standing balance support: Reliant on assistive device for balance Standing balance-Leahy Scale: Poor Standing balance comment: can static stand without assist, but BUE support for gait                            Cognition Arousal/Alertness: Awake/alert Behavior During Therapy: WFL for tasks assessed/performed Overall Cognitive Status: Within Functional Limits for tasks assessed                                          Exercises      General Comments General comments (skin integrity, edema, etc.): Reinforced back care/prec, log roll, transitions from sidelying, bracing issues, lifting restrictions and  progression of gait post d/c.      Pertinent Vitals/Pain Pain Assessment Pain Assessment: Faces Faces Pain Scale: Hurts little more Pain Location: Incisional Pain Descriptors / Indicators: Grimacing Pain Intervention(s): Monitored during session    Home Living                          Prior Function            PT Goals (current goals can now be found in the care plan section) Acute Rehab PT Goals Patient  Stated Goal: home independent PT Goal Formulation: With patient Time For Goal Achievement: 12/05/21 Potential to Achieve Goals: Good Progress towards PT goals: Progressing toward goals    Frequency    Min 5X/week      PT Plan Discharge plan needs to be updated;Current plan remains appropriate    Co-evaluation              AM-PAC PT "6 Clicks" Mobility   Outcome Measure  Help needed turning from your back to your side while in a flat bed without using bedrails?: A Little Help needed moving from lying on your back to sitting on the side of a flat bed without using bedrails?: A Little Help needed moving to and from a bed to a chair (including a wheelchair)?: A Little Help needed standing up from a chair using your arms (e.g., wheelchair or bedside chair)?: A Little Help needed to walk in hospital room?: A Little Help needed climbing 3-5 steps with a railing? : A Little 6 Click Score: 18    End of Session Equipment Utilized During Treatment: Back brace Activity Tolerance: Patient tolerated treatment well Patient left: in chair;with call bell/phone within reach Nurse Communication: Mobility status PT Visit Diagnosis: Other abnormalities of gait and mobility (R26.89);Pain     Time: 7741-2878 PT Time Calculation (min) (ACUTE ONLY): 23 min  Charges:  $Gait Training: 8-22 mins $Therapeutic Activity: 8-22 mins                     11/23/2021  Jacinto Halim., PT Acute Rehabilitation Services [phone]  (office)   [name] 11/23/2021, 11:05 AM

## 2021-11-23 NOTE — TOC Transition Note (Signed)
Transition of Care (TOC) - CM/SW Discharge Note Donn Pierini RN,BSN Transitions of Care Unit 4NP (Non Trauma)- RN Case Manager See Treatment Team for direct Phone #   Patient Details  Name: Joshua Ruiz MRN: [id number] Date of Birth: 1940/08/14  Transition of Care [facility]) CM/SW Contact:  Darrold Span, RN Phone Number: 11/23/2021, 2:20 PM   Clinical Narrative:    Pt stable for transition home today, Orders for HHPT and DME have been placed.  CM spoke with pt at bedside, son also present and is to transport pt home.  Discussed HHPT and DME- choice offered with list provided Per CMS guidelines from PhoneFinancing[employer]pl website with star ratings (copy placed in shadow chart), per pt he has selected [facility] has his first choice. Pt agreeable to use in house provider [facility] for DME need (declined needing Medicare/compare list for DME needs).   Address, phone # and PCP confirmed.  Pt voiced that he plans to start out with HHPT and hopes to transition to outpt PT at [facility] location.   Referral called to [facility] for HHPT need- referral has been accepted.   DME- RW referral placed in [facility] system- once processed will be delivered to room prior to discharge.   No further TOC needs noted.     Final next level of care: Home w Home Health Services Barriers to Discharge: No Barriers Identified   Patient Goals and CMS Choice Patient states their goals for this hospitalization and ongoing recovery are:: return home CMS Medicare.gov Compare Post Acute Care list provided to:: Patient Choice offered to / list presented to : Patient  Discharge Placement               Home w/ [facility]        Discharge Plan and Services   Discharge Planning Services: CM Consult Post Acute Care Choice: Durable Medical Equipment, Home Health          DME Arranged: Walker rolling DME Agency: [facility] Date DME Agency Contacted: 11/23/21 Time DME Agency Contacted:  1420 Representative spoke with at DME Agency: [facility] HH Arranged: PT HH Agency: [facility] Date [facility] Agency Contacted: 11/23/21 Time HH Agency Contacted: 1420 Representative spoke with at [facility] Agency: Kandee Keen  Social Determinants of Health (SDOH) Interventions     Readmission Risk Interventions     No data to display

## 2021-11-23 NOTE — Progress Notes (Signed)
Occupational Therapy Treatment Patient Details Name: Yandell Mcjunkins MRN: [id number] DOB: Oct 16, 1940 Today's Date: 11/23/2021   History of present illness 81 yo M admitted 11/10 for elective lumbar surgery, s/p PLIF L2-3, L3-4, and L4-5.  PMH includes: GERD and RA.   OT comments  Patient received in supine and eager to get OOB. Patient instructed on log rolling technique and min assist to get to EOB. Performed gown change while seated on EOB and patient was min guard to stand from raised bed and to ambulate to bathroom. Patient able to stand at sink for grooming tasks.  Patient provided education on adaptive equipment for LB dressing with good understanding on reacher and sock aide use with min assist. Patient is making good gains and states he will have family stay to assist. Acute OT to continue to follow.    Recommendations for follow up therapy are one component of a multi-disciplinary discharge planning process, led by the attending physician.  Recommendations may be updated based on patient status, additional functional criteria and insurance authorization.    Follow Up Recommendations  No OT follow up     Assistance Recommended at Discharge Set up Supervision/Assistance  Patient can return home with the following  Assist for transportation;Assistance with cooking/housework   Equipment Recommendations  None recommended by OT    Recommendations for Other Services      Precautions / Restrictions Precautions Precautions: Back Precaution Booklet Issued: Yes (comment) Precaution Comments: able to recall precautions Required Braces or Orthoses: Spinal Brace Spinal Brace: Applied in sitting position Restrictions Weight Bearing Restrictions: No       Mobility Bed Mobility Overal bed mobility: Needs Assistance Bed Mobility: Rolling, Sidelying to Sit Rolling: Min guard Sidelying to sit: Min assist       General bed mobility comments: education on log rolling technique with  assistance to elevate trunk    Transfers Overall transfer level: Needs assistance Equipment used: Rolling walker (2 wheels) Transfers: Sit to/from Stand Sit to Stand: Min assist, Min guard     Step pivot transfers: Min guard, Min assist     General transfer comment: verbal cues for hand placement     Balance Overall balance assessment: Needs assistance Sitting-balance support: Feet supported Sitting balance-Leahy Scale: Good     Standing balance support: Reliant on assistive device for balance Standing balance-Leahy Scale: Poor Standing balance comment: able to stand at sink for grooming tasks with one extremity support                           ADL either performed or assessed with clinical judgement   ADL Overall ADL's : Needs assistance/impaired     Grooming: Wash/dry hands;Wash/dry face;Oral care;Min guard;Standing Grooming Details (indicate cue type and reason): at sink         Upper Body Dressing : Minimal assistance Upper Body Dressing Details (indicate cue type and reason): to change gown Lower Body Dressing: Minimal assistance;Sit to/from stand;With adaptive equipment Lower Body Dressing Details (indicate cue type and reason): education on AE with reacher and sock aide Toilet Transfer: Min guard;Minimal assistance Toilet Transfer Details (indicate cue type and reason): verbal cues for hand placement           General ADL Comments: education on hip kit and what all is included to aide in LB dressing    Extremity/Trunk Assessment              Vision  Perception     Praxis      Cognition Arousal/Alertness: Awake/alert Behavior During Therapy: WFL for tasks assessed/performed Overall Cognitive Status: Within Functional Limits for tasks assessed                                 General Comments: able to recall precautions        Exercises      Shoulder Instructions       General Comments Reinforced back  care/prec, log roll, transitions from sidelying, bracing issues, lifting restrictions and progression of gait post d/c.    Pertinent Vitals/ Pain       Pain Assessment Pain Assessment: Faces Faces Pain Scale: Hurts a little bit Pain Location: Incisional Pain Descriptors / Indicators: Guarding, Grimacing Pain Intervention(s): Monitored during session, Repositioned  Home Living                                          Prior Functioning/Environment              Frequency  Min 2X/week        Progress Toward Goals  OT Goals(current goals can now be found in the care plan section)  Progress towards OT goals: Progressing toward goals  Acute Rehab OT Goals Patient Stated Goal: go home OT Goal Formulation: With patient Time For Goal Achievement: 12/04/21 Potential to Achieve Goals: Good ADL Goals Pt Will Perform Grooming: with modified independence;standing Pt Will Perform Lower Body Dressing: with modified independence;sit to/from stand;with adaptive equipment Pt Will Transfer to Toilet: with modified independence;regular height toilet;ambulating  Plan Discharge plan remains appropriate    Co-evaluation                 AM-PAC OT "6 Clicks" Daily Activity     Outcome Measure   Help from another person eating meals?: None Help from another person taking care of personal grooming?: None Help from another person toileting, which includes using toliet, bedpan, or urinal?: A Little Help from another person bathing (including washing, rinsing, drying)?: A Little Help from another person to put on and taking off regular upper body clothing?: None Help from another person to put on and taking off regular lower body clothing?: A Little 6 Click Score: 21    End of Session Equipment Utilized During Treatment: Rolling walker (2 wheels);Other (comment) (reacher and sock aide for AE training)  OT Visit Diagnosis: Unsteadiness on feet (R26.81);Pain    Activity Tolerance Patient tolerated treatment well   Patient Left in chair;with call bell/phone within reach;with chair alarm set   Nurse Communication Mobility status        Time: 7939-0300 OT Time Calculation (min): 28 min  Charges: OT General Charges $OT Visit: 1 Visit OT Treatments $Self Care/Home Management : 23-37 mins  Lodema Hong, [place]  Office [place] 11/23/2021, 11:13 AM

## 2021-11-24 ENCOUNTER — Telehealth: Payer: Self-pay

## 2021-11-24 NOTE — Telephone Encounter (Signed)
Transition Care Management Follow-up Telephone Call Date of discharge and from where: 11/23/21 [facility]. Dx: Spondylolisthesis of lumbar region How have you been since you were released from the hospital? I'm doing alright. Any questions or concerns? No  Items Reviewed: Did the pt receive and understand the discharge instructions provided? Yes  Medications obtained and verified? No  Other? No  Any new allergies since your discharge? No  Dietary orders reviewed? No Do you have support at home? Yes   Home Care and Equipment/Supplies: Were home health services ordered? yes If so, what is the name of the agency? unknown  Has the agency set up a time to come to the patient's home? no Were any new equipment or medical supplies ordered?  No What is the name of the medical supply agency? N/a Were you able to get the supplies/equipment? not applicable Do you have any questions related to the use of the equipment or supplies? No  Functional Questionnaire: (I = Independent and D = Dependent) ADLs: I  Bathing/Dressing- I  Meal Prep- I  Eating- I  Maintaining continence- I  Transferring/Ambulation- I  Managing Meds- I  Follow up appointments reviewed:  PCP Hospital f/u appt confirmed? No  Scheduled to see N/A on N/A @ N/A. Specialist Hospital f/u appt confirmed? Yes  Scheduled to see unknown on 12/14/21 @ unknown. Are transportation arrangements needed? No  If their condition worsens, is the pt aware to call PCP or go to the Emergency Dept.? Yes Was the patient provided with contact information for the PCP's office or ED? Yes Was to pt encouraged to call back with questions or concerns? Yes  Arvil Persons, RN, BSN RN Clinical Supervisor [facility] [employer]

## 2021-11-30 ENCOUNTER — Encounter (HOSPITAL_COMMUNITY): Payer: Self-pay | Admitting: Neurosurgery

## 2021-12-07 DIAGNOSIS — J019 Acute sinusitis, unspecified: Secondary | ICD-10-CM | POA: Diagnosis not present

## 2021-12-07 DIAGNOSIS — M545 Low back pain, unspecified: Secondary | ICD-10-CM | POA: Diagnosis not present

## 2021-12-30 ENCOUNTER — Ambulatory Visit: Payer: Medicare PPO | Attending: Neurosurgery | Admitting: Physical Therapy

## 2021-12-30 ENCOUNTER — Encounter: Payer: Self-pay | Admitting: Physical Therapy

## 2021-12-30 DIAGNOSIS — M6281 Muscle weakness (generalized): Secondary | ICD-10-CM | POA: Insufficient documentation

## 2021-12-30 DIAGNOSIS — M5459 Other low back pain: Secondary | ICD-10-CM | POA: Insufficient documentation

## 2021-12-30 DIAGNOSIS — R262 Difficulty in walking, not elsewhere classified: Secondary | ICD-10-CM | POA: Diagnosis not present

## 2021-12-30 NOTE — Therapy (Signed)
OUTPATIENT PHYSICAL THERAPY THORACOLUMBAR EVALUATION   Patient Name: Joshua Ruiz MRN: [id number] DOB:1940/07/29, 81 y.o., male Today's Date: 12/30/2021  END OF SESSION:   Past Medical History:  Diagnosis Date   GERD (gastroesophageal reflux disease)    Rheumatoid arthritis (HCC)    Past Surgical History:  Procedure Laterality Date   EYE SURGERY Bilateral    cataract extraction   HEMORRHOID SURGERY     LAMINECTOMY WITH POSTERIOR LATERAL ARTHRODESIS LEVEL 3 N/A 11/20/2021   Procedure: LUMBAR TWO THROUGH LUMBAR THREE, LUMBAR THREE THROUGH LUMBAR FOUR, LUMBAR FOUR THROUGH LUMBAR FIVE LAMINECTOMY WITH FACETECTOMY, POSTERIOR SEGMENTAL INSTRUMENTATION, POSTEROLATERAL ARTHRODESIS;  Surgeon: Lisbeth Renshaw, MD;  Location: [facility] OR;  Service: Neurosurgery;  Laterality: N/A;  3C   LYMPH NODE BIOPSY     Neck   TRIGGER FINGER RELEASE Right    3rd   Patient Active Problem List   Diagnosis Date Noted   Spondylolisthesis of lumbar region 11/20/2021   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/2021   Lumbar spondylosis 10/20/2021   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-L5 level 10/20/2021   Primary osteoarthritis of left knee 08/14/2021   Chronic epididymitis 08/13/2020   Hives 07/11/2020   Rheumatoid arthritis (HCC) 07/11/2020   Osteoarthritis of left knee 07/11/2020   Cervical arthritis 07/11/2020   Gastroesophageal reflux disease 07/11/2020   Presbyopia of both eyes 04/20/2015   Pseudophakia of both eyes 04/20/2015    PCP: Veto Kemps  REFERRING PROVIDER: Conchita Paris  REFERRING DIAG: s/p lumbar fusion L2-5  Rationale for Evaluation and Treatment: Rehabilitation  THERAPY DIAG:  No diagnosis found.  ONSET DATE: 11/20/21  SUBJECTIVE:                                                                                                                                                                                           SUBJECTIVE STATEMENT: Patient reports that he had been having LBP  for a number of years, reports that he started having difficulty with leg pain and weakness as well as some urinary issues, underwent a 3 level laminectomy with instumentation arthrodesis.  3 night hospital stay.  Had some home PT just to get moving and for safety in the home  PERTINENT HISTORY:  See above  PAIN:  Are you having pain? Yes: NPRS scale: 2/10 Pain location: low back Pain description: tender, tight, ache Aggravating factors: touching it, standing, shopping, gets tired "very quick" 5 minutes standing, pain up to 6/10 Relieving factors: sit rest  PRECAUTIONS: Back  WEIGHT BEARING RESTRICTIONS: No  FALLS:  Has patient fallen in last 6 months? No  LIVING ENVIRONMENT: Lives with: lives alone Lives in: House/apartment Stairs: No Has following  equipment at home: None  OCCUPATION: retired  PLOF: Independent does own housework some gym a few days a week  PATIENT GOALS: feel better, better motions, less difficulty standing, pick up things  NEXT MD VISIT:  January 3  OBJECTIVE:   DIAGNOSTIC FINDINGS:  healing  PATIENT SURVEYS:  FOTO 33  SCREENING FOR RED FLAGS: Bowel or bladder incontinence: No Spinal tumors: No Cauda equina syndrome: No Compression fracture: No Abdominal aneurysm: No  COGNITION: Overall cognitive status: Within functional limits for tasks assessed     SENSATION: Mild numbness in the left thigh  MUSCLE LENGTH: Hamstrings: Right 50 deg; Left 40 deg  POSTURE: decreased lumbar lordosis  PALPATION: Very tight and tender in the lumbar paraspinals and the mild tenderness in the buttocks  LUMBAR ROM: Following BLT rules he is very stiff especially with lateral motions   LOWER EXTREMITY ROM:   other motions guarded but WFL's  Active  Right eval Left eval  Hip flexion    Hip extension    Hip abduction    Hip adduction    Hip internal rotation    Hip external rotation    Knee flexion    Knee extension  -15  Ankle dorsiflexion     Ankle plantarflexion    Ankle inversion    Ankle eversion     (Blank rows = not tested)  LOWER EXTREMITY MMT:    MMT Right eval Left eval  Hip flexion 4 4-  Hip extension    Hip abduction 4- 4-  Hip adduction    Hip internal rotation    Hip external rotation    Knee flexion 4- 4-  Knee extension 4- 4-  Ankle dorsiflexion 4 4  Ankle plantarflexion    Ankle inversion    Ankle eversion     (Blank rows = not tested)  FUNCTIONAL TESTS:  5 times sit to stand: 20 seconds Timed up and go (TUG): 17 3 minute walk test: stopped at 2 minutes 360 feet  GAIT: Distance walked: 100 feet Assistive device utilized: None Level of assistance: Complete Independence Comments: first few steps the left leg really gave way and he stumbled has trendelenberg on the left, on stairs he did one step at a time  TODAY'S TREATMENT:                                                                                                                              DATE:     PATIENT EDUCATION:  Education details: POC Person educated: Patient Education method: Explanation Education comprehension: verbalized understanding  HOME EXERCISE PROGRAM: Reviewed HEP from the hospital  ASSESSMENT:  CLINICAL IMPRESSION: Patient is a 81 y.o. male who was seen today for physical therapy evaluation and treatment for 3 level laminectomy with instrument arthrodesis. He is weak on the LE's left > right, has very limited stamina, could not walk > 2 minutes due to fatigue and mm tightness.    OBJECTIVE IMPAIRMENTS:  Abnormal gait, cardiopulmonary status limiting activity, decreased activity tolerance, decreased balance, decreased endurance, decreased mobility, difficulty walking, decreased ROM, decreased strength, increased muscle spasms, impaired flexibility, improper body mechanics, postural dysfunction, and pain.   REHAB POTENTIAL: Good  CLINICAL DECISION MAKING: Evolving/moderate complexity  EVALUATION COMPLEXITY:  Low   GOALS: Goals reviewed with patient? Yes  SHORT TERM GOALS: Target date: 01/10/22  Independent with initial HEP Goal status: INITIAL  LONG TERM GOALS: Target date: 03/31/22  Understand posture and body mechanics  Goal status: INITIAL  2.  Decrease pain 50% Goal status: INITIAL  3.  Increase lumbar ROM 25% Goal status: INITIAL  4.  Walk 1000 feet  Goal status: INITIAL  5.  Be able to put on shoes and socks without difficulty Goal status: INITIAL   PLAN:  PT FREQUENCY: 1-2x/week  PT DURATION: 12 weeks  PLANNED INTERVENTIONS: Therapeutic exercises, Therapeutic activity, Neuromuscular re-education, Balance training, Gait training, Patient/Family education, Self Care, Joint mobilization, Electrical stimulation, Cryotherapy, Moist heat, and Manual therapy.  PLAN FOR NEXT SESSION: slowly increase work on stamina and strength and function, sees MD on 01/13/22 after that we will progress a little faster   Jearld Lesch, PT 12/30/2021, 12:54 PM

## 2022-01-05 NOTE — Addendum Note (Signed)
Addended by: Jearld Lesch on: 01/05/2022 01:35 PM   Modules accepted: Orders

## 2022-01-07 ENCOUNTER — Encounter: Payer: Self-pay | Admitting: Physical Therapy

## 2022-01-07 ENCOUNTER — Ambulatory Visit: Payer: Medicare PPO | Admitting: Physical Therapy

## 2022-01-07 DIAGNOSIS — Z79899 Other long term (current) drug therapy: Secondary | ICD-10-CM | POA: Diagnosis not present

## 2022-01-07 DIAGNOSIS — M5459 Other low back pain: Secondary | ICD-10-CM | POA: Diagnosis not present

## 2022-01-07 DIAGNOSIS — M6281 Muscle weakness (generalized): Secondary | ICD-10-CM

## 2022-01-07 DIAGNOSIS — M069 Rheumatoid arthritis, unspecified: Secondary | ICD-10-CM | POA: Diagnosis not present

## 2022-01-07 DIAGNOSIS — R262 Difficulty in walking, not elsewhere classified: Secondary | ICD-10-CM

## 2022-01-07 NOTE — Therapy (Signed)
OUTPATIENT PHYSICAL THERAPY THORACOLUMBAR TREATMENT   Patient Name: Joshua Ruiz MRN: [id number] DOB:02-29-1940, 81 y.o., male Today's Date: 01/07/2022  END OF SESSION:  PT End of Session - 01/07/22 1444     Visit Number 2    Date for PT Re-Evaluation 02/28/22    Authorization Type [employer]    PT Start Time 1438    PT Stop Time 1530    PT Time Calculation (min) 52 min    Activity Tolerance Patient tolerated treatment well    Behavior During Therapy WFL for tasks assessed/performed             Past Medical History:  Diagnosis Date   GERD (gastroesophageal reflux disease)    Rheumatoid arthritis (HCC)    Past Surgical History:  Procedure Laterality Date   EYE SURGERY Bilateral    cataract extraction   HEMORRHOID SURGERY     LAMINECTOMY WITH POSTERIOR LATERAL ARTHRODESIS LEVEL 3 N/A 11/20/2021   Procedure: LUMBAR TWO THROUGH LUMBAR THREE, LUMBAR THREE THROUGH LUMBAR FOUR, LUMBAR FOUR THROUGH LUMBAR FIVE LAMINECTOMY WITH FACETECTOMY, POSTERIOR SEGMENTAL INSTRUMENTATION, POSTEROLATERAL ARTHRODESIS;  Surgeon: Lisbeth Renshaw, MD;  Location: [facility];  Service: Neurosurgery;  Laterality: N/A;  3C   LYMPH NODE BIOPSY     Neck   TRIGGER FINGER RELEASE Right    3rd   Patient Active Problem List   Diagnosis Date Noted   Spondylolisthesis of lumbar region 11/20/2021   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/2021   Lumbar spondylosis 10/20/2021   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-L5 level 10/20/2021   Primary osteoarthritis of left knee 08/14/2021   Chronic epididymitis 08/13/2020   Hives 07/11/2020   Rheumatoid arthritis (HCC) 07/11/2020   Osteoarthritis of left knee 07/11/2020   Cervical arthritis 07/11/2020   Gastroesophageal reflux disease 07/11/2020   Presbyopia of both eyes 04/20/2015   Pseudophakia of both eyes 04/20/2015    PCP: Veto Kemps  REFERRING PROVIDER: Conchita Paris  REFERRING DIAG: s/p lumbar fusion L2-5  Rationale for Evaluation and Treatment:  Rehabilitation  THERAPY DIAG:  Other low back pain  Muscle weakness (generalized)  Difficulty in walking, not elsewhere classified  ONSET DATE: 11/20/21  SUBJECTIVE:                                                                                                                                                                                           SUBJECTIVE STATEMENT: Reports that he is doing well, sore to the touch in the back, reports slow and tired  PERTINENT HISTORY:  See above  PAIN:  Are you having pain? Yes: NPRS scale: 2/10 Pain location: low back Pain description: tender, tight, ache Aggravating factors:  touching it, standing, shopping, gets tired "very quick" 5 minutes standing, pain up to 6/10 Relieving factors: sit rest  PRECAUTIONS: Back  WEIGHT BEARING RESTRICTIONS: No  FALLS:  Has patient fallen in last 6 months? No  LIVING ENVIRONMENT: Lives with: lives alone Lives in: House/apartment Stairs: No Has following equipment at home: None  OCCUPATION: retired  PLOF: Independent does own housework some gym a few days a week  PATIENT GOALS: feel better, better motions, less difficulty standing, pick up things  NEXT MD VISIT:  January 3  OBJECTIVE:   DIAGNOSTIC FINDINGS:  healing  PATIENT SURVEYS:  FOTO 33  SCREENING FOR RED FLAGS: Bowel or bladder incontinence: No Spinal tumors: No Cauda equina syndrome: No Compression fracture: No Abdominal aneurysm: No  COGNITION: Overall cognitive status: Within functional limits for tasks assessed     SENSATION: Mild numbness in the left thigh  MUSCLE LENGTH: Hamstrings: Right 50 deg; Left 40 deg  POSTURE: decreased lumbar lordosis  PALPATION: Very tight and tender in the lumbar paraspinals and the mild tenderness in the buttocks  LUMBAR ROM: Following BLT rules he is very stiff especially with lateral motions   LOWER EXTREMITY ROM:   other motions guarded but WFL's  Active  Right eval  Left eval  Hip flexion    Hip extension    Hip abduction    Hip adduction    Hip internal rotation    Hip external rotation    Knee flexion    Knee extension  -15  Ankle dorsiflexion    Ankle plantarflexion    Ankle inversion    Ankle eversion     (Blank rows = not tested)  LOWER EXTREMITY MMT:    MMT Right eval Left eval  Hip flexion 4 4-  Hip extension    Hip abduction 4- 4-  Hip adduction    Hip internal rotation    Hip external rotation    Knee flexion 4- 4-  Knee extension 4- 4-  Ankle dorsiflexion 4 4  Ankle plantarflexion    Ankle inversion    Ankle eversion     (Blank rows = not tested)  FUNCTIONAL TESTS:  5 times sit to stand: 20 seconds Timed up and go (TUG): 17 3 minute walk test: stopped at 2 minutes 360 feet  GAIT: Distance walked: 100 feet Assistive device utilized: None Level of assistance: Complete Independence Comments: first few steps the left leg really gave way and he stumbled has trendelenberg on the left, on stairs he did one step at a time  TODAY'S TREATMENT:                                                                                                                              DATE:   01/07/22 Nustep level 5 x 6 minutes Leg curls 25# 2x15 Leg extension 5# 2x15 Seated row 25# 2x10 Lats 25# 2x10 Gait outside around the small parking [place] two rest breaks standing  Hip adduction, extension and abduction 5# x 10 each Feet on ball K2C, small trunk rotaiton, posterior activation and isometric abs PROM HS, piriformis and calves   PATIENT EDUCATION:  Education details: POC Person educated: Patient Education method: Explanation Education comprehension: verbalized understanding  HOME EXERCISE PROGRAM: Reviewed HEP from the hospital  ASSESSMENT:  CLINICAL IMPRESSION: Patient doing better with his tolerance to activity, still very slow and has difficulty, still stiff, no pain with the exercises today   OBJECTIVE IMPAIRMENTS:  Abnormal gait, cardiopulmonary status limiting activity, decreased activity tolerance, decreased balance, decreased endurance, decreased mobility, difficulty walking, decreased ROM, decreased strength, increased muscle spasms, impaired flexibility, improper body mechanics, postural dysfunction, and pain.   REHAB POTENTIAL: Good  CLINICAL DECISION MAKING: Evolving/moderate complexity  EVALUATION COMPLEXITY: Low   GOALS: Goals reviewed with patient? Yes  SHORT TERM GOALS: Target date: 01/10/22  Independent with initial HEP Goal status: ongoing  LONG TERM GOALS: Target date: 03/31/22  Understand posture and body mechanics  Goal status: INITIAL  2.  Decrease pain 50% Goal status: INITIAL  3.  Increase lumbar ROM 25% Goal status: INITIAL  4.  Walk 1000 feet  Goal status: INITIAL  5.  Be able to put on shoes and socks without difficulty Goal status: INITIAL   PLAN:  PT FREQUENCY: 1-2x/week  PT DURATION: 12 weeks  PLANNED INTERVENTIONS: Therapeutic exercises, Therapeutic activity, Neuromuscular re-education, Balance training, Gait training, Patient/Family education, Self Care, Joint mobilization, Electrical stimulation, Cryotherapy, Moist heat, and Manual therapy.  PLAN FOR NEXT SESSION: slowly increase work on stamina and strength and function, sees MD on 01/13/22 after that we will progress a little faster   Jearld Lesch, PT 01/07/2022, 2:44 PM

## 2022-01-12 ENCOUNTER — Ambulatory Visit: Payer: Medicare PPO | Attending: Neurosurgery | Admitting: Physical Therapy

## 2022-01-12 ENCOUNTER — Encounter: Payer: Self-pay | Admitting: Physical Therapy

## 2022-01-12 DIAGNOSIS — R262 Difficulty in walking, not elsewhere classified: Secondary | ICD-10-CM | POA: Insufficient documentation

## 2022-01-12 DIAGNOSIS — M6281 Muscle weakness (generalized): Secondary | ICD-10-CM | POA: Insufficient documentation

## 2022-01-12 DIAGNOSIS — R293 Abnormal posture: Secondary | ICD-10-CM | POA: Diagnosis not present

## 2022-01-12 DIAGNOSIS — M5459 Other low back pain: Secondary | ICD-10-CM | POA: Diagnosis not present

## 2022-01-12 DIAGNOSIS — M542 Cervicalgia: Secondary | ICD-10-CM | POA: Diagnosis not present

## 2022-01-12 NOTE — Therapy (Signed)
OUTPATIENT PHYSICAL THERAPY THORACOLUMBAR TREATMENT   Patient Name: Joshua Ruiz MRN: [id number] DOB:[birth year], 82 y.o., male Today's Date: 01/12/2022  END OF SESSION:  PT End of Session - 01/12/22 1354     Visit Number 3    Date for PT Re-Evaluation 02/28/22    Authorization Type [employer]    PT Start Time 1354    PT Stop Time 1441    PT Time Calculation (min) 47 min    Activity Tolerance Patient tolerated treatment well    Behavior During Therapy WFL for tasks assessed/performed             Past Medical History:  Diagnosis Date   GERD (gastroesophageal reflux disease)    Rheumatoid arthritis ([place])    Past Surgical History:  Procedure Laterality Date   EYE SURGERY Bilateral    cataract extraction   HEMORRHOID SURGERY     LAMINECTOMY WITH POSTERIOR LATERAL ARTHRODESIS LEVEL 3 N/A 11/20/2021   Procedure: LUMBAR TWO THROUGH LUMBAR THREE, LUMBAR THREE THROUGH LUMBAR FOUR, LUMBAR FOUR THROUGH LUMBAR FIVE LAMINECTOMY WITH FACETECTOMY, POSTERIOR SEGMENTAL INSTRUMENTATION, POSTEROLATERAL ARTHRODESIS;  Surgeon: Consuella Lose, MD;  Location: [facility];  Service: Neurosurgery;  Laterality: N/A;  3C   LYMPH NODE BIOPSY     Neck   TRIGGER FINGER RELEASE Right    3rd   Patient Active Problem List   Diagnosis Date Noted   Spondylolisthesis of lumbar region 11/20/2021   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/2021   Lumbar spondylosis 10/20/2021   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-L5 level 10/20/2021   Primary osteoarthritis of left knee 08/14/2021   Chronic epididymitis 08/13/2020   Hives 07/11/2020   Rheumatoid arthritis ([place]) 07/11/2020   Osteoarthritis of left knee 07/11/2020   Cervical arthritis 07/11/2020   Gastroesophageal reflux disease 07/11/2020   Presbyopia of both eyes 04/20/2015   Pseudophakia of both eyes 04/20/2015    PCP: Gena Fray  REFERRING PROVIDER: Kathyrn Sheriff  REFERRING DIAG: s/p lumbar fusion L2-5  Rationale for Evaluation and Treatment:  Rehabilitation  THERAPY DIAG:  Other low back pain  Muscle weakness (generalized)  Difficulty in walking, not elsewhere classified  ONSET DATE: 11/20/21  SUBJECTIVE:                                                                                                                                                                                           SUBJECTIVE STATEMENT: Reports that he was a little sore after the last visit, mild pain mostly just stiff and feels weak  PERTINENT HISTORY:  See above  PAIN:  Are you having pain? Yes: NPRS scale: 2/10 Pain location: low back Pain description: tender, tight, ache  Aggravating factors: touching it, standing, shopping, gets tired "very quick" 5 minutes standing, pain up to 6/10 Relieving factors: sit rest  PRECAUTIONS: Back  WEIGHT BEARING RESTRICTIONS: No  FALLS:  Has patient fallen in last 6 months? No  LIVING ENVIRONMENT: Lives with: lives alone Lives in: House/apartment Stairs: No Has following equipment at home: None  OCCUPATION: retired  PLOF: Independent does own housework some gym a few days a week  PATIENT GOALS: feel better, better motions, less difficulty standing, pick up things  NEXT MD VISIT:  January 3  OBJECTIVE:   DIAGNOSTIC FINDINGS:  healing  PATIENT SURVEYS:  FOTO 33  SCREENING FOR RED FLAGS: Bowel or bladder incontinence: No Spinal tumors: No Cauda equina syndrome: No Compression fracture: No Abdominal aneurysm: No  COGNITION: Overall cognitive status: Within functional limits for tasks assessed     SENSATION: Mild numbness in the left thigh  MUSCLE LENGTH: Hamstrings: Right 50 deg; Left 40 deg  POSTURE: decreased lumbar lordosis  PALPATION: Very tight and tender in the lumbar paraspinals and the mild tenderness in the buttocks  LUMBAR ROM: Following BLT rules he is very stiff especially with lateral motions   LOWER EXTREMITY ROM:   other motions guarded but WFL's  Active   Right eval Left eval  Hip flexion    Hip extension    Hip abduction    Hip adduction    Hip internal rotation    Hip external rotation    Knee flexion    Knee extension  -15  Ankle dorsiflexion    Ankle plantarflexion    Ankle inversion    Ankle eversion     (Blank rows = not tested)  LOWER EXTREMITY MMT:    MMT Right eval Left eval  Hip flexion 4 4-  Hip extension    Hip abduction 4- 4-  Hip adduction    Hip internal rotation    Hip external rotation    Knee flexion 4- 4-  Knee extension 4- 4-  Ankle dorsiflexion 4 4  Ankle plantarflexion    Ankle inversion    Ankle eversion     (Blank rows = not tested)  FUNCTIONAL TESTS:  5 times sit to stand: 20 seconds Timed up and go (TUG): 17 3 minute walk test: stopped at 2 minutes 360 feet  GAIT: Distance walked: 100 feet Assistive device utilized: None Level of assistance: Complete Independence Comments: first few steps the left leg really gave way and he stumbled has trendelenberg on the left, on stairs he did one step at a time  TODAY'S TREATMENT:                                                                                                                              DATE:   01/12/22 UBE level 3 x 4 minutes Nustep level 4 x 7 minutes SEated row 25# 2x10 Lats 25# 2x10 Chest press 10# 2x10 5# straight arm pulls 2x10 Leg curls  20# 2x10 Leg extension 5# 2x10 P ball rollouts Feet on ball K2C, trunk rotation, posterior activation, isometric abs HS and piriformis stretches  01/07/22 Nustep level 5 x 6 minutes Leg curls 25# 2x15 Leg extension 5# 2x15 Seated row 25# 2x10 Lats 25# 2x10 Gait outside around the small parking [place] two rest breaks standing Hip adduction, extension and abduction 5# x 10 each Feet on ball K2C, small trunk rotaiton, posterior activation and isometric abs PROM HS, piriformis and calves   PATIENT EDUCATION:  Education details: POC Person educated: Patient Education method:  Explanation Education comprehension: verbalized understanding  HOME EXERCISE PROGRAM: Reviewed HEP from the hospital  ASSESSMENT:  CLINICAL IMPRESSION: Patient doing better with his tolerance to activity, still very slow and has difficulty, still stiff, no pain with the exercises today   OBJECTIVE IMPAIRMENTS: Abnormal gait, cardiopulmonary status limiting activity, decreased activity tolerance, decreased balance, decreased endurance, decreased mobility, difficulty walking, decreased ROM, decreased strength, increased muscle spasms, impaired flexibility, improper body mechanics, postural dysfunction, and pain.   REHAB POTENTIAL: Good  CLINICAL DECISION MAKING: Evolving/moderate complexity  EVALUATION COMPLEXITY: Low   GOALS: Goals reviewed with patient? Yes  SHORT TERM GOALS: Target date: 01/10/22  Independent with initial HEP Goal status: met  LONG TERM GOALS: Target date: 03/31/22  Understand posture and body mechanics  Goal status: INITIAL  2.  Decrease pain 50% Goal status: INITIAL  3.  Increase lumbar ROM 25% Goal status: INITIAL  4.  Walk 1000 feet  Goal status: INITIAL  5.  Be able to put on shoes and socks without difficulty Goal status: INITIAL   PLAN:  PT FREQUENCY: 1-2x/week  PT DURATION: 12 weeks  PLANNED INTERVENTIONS: Therapeutic exercises, Therapeutic activity, Neuromuscular re-education, Balance training, Gait training, Patient/Family education, Self Care, Joint mobilization, Electrical stimulation, Cryotherapy, Moist heat, and Manual therapy.  PLAN FOR NEXT SESSION: slowly increase work on stamina and strength and function, sees MD on 01/13/22 after that we will progress a little faster   Mamie Hundertmark W, PT 01/12/2022, 1:54 PM

## 2022-01-13 DIAGNOSIS — Z6829 Body mass index (BMI) 29.0-29.9, adult: Secondary | ICD-10-CM | POA: Diagnosis not present

## 2022-01-13 DIAGNOSIS — M4316 Spondylolisthesis, lumbar region: Secondary | ICD-10-CM | POA: Diagnosis not present

## 2022-01-13 DIAGNOSIS — M48062 Spinal stenosis, lumbar region with neurogenic claudication: Secondary | ICD-10-CM | POA: Diagnosis not present

## 2022-01-18 ENCOUNTER — Ambulatory Visit: Payer: Medicare PPO | Admitting: Family Medicine

## 2022-01-18 ENCOUNTER — Encounter: Payer: Self-pay | Admitting: Family Medicine

## 2022-01-18 VITALS — BP 120/68 | HR 70 | Temp 97.8°F | Ht 68.0 in | Wt 197.4 lb

## 2022-01-18 DIAGNOSIS — J310 Chronic rhinitis: Secondary | ICD-10-CM

## 2022-01-18 DIAGNOSIS — L309 Dermatitis, unspecified: Secondary | ICD-10-CM | POA: Diagnosis not present

## 2022-01-18 MED ORDER — MOMETASONE FUROATE 0.1 % EX CREA: TOPICAL_CREAM | Freq: Every day | CUTANEOUS | 1 refills | Status: DC

## 2022-01-18 MED ORDER — AZELASTINE HCL 0.1 % NA SOLN: 1.0000 | Freq: Two times a day (BID) | NASAL | 12 refills | Status: DC

## 2022-01-18 NOTE — Progress Notes (Signed)
[facility] [address] [place] [address] Dept: [phone] Dept Fax: [phone]  Office Visit  Subjective:    Patient ID: Joshua Ruiz, male    DOB: 1940/04/13, 82 y.o..   MRN: [id number]  Chief Complaint  Patient presents with   Acute Visit    C/o having a rash on RT ankle x 2 weeks.  Also having sinus drainage, cough x 3 weeks.  Has taken Robitussin with little relief.     History of Present Illness:  Patient is in today complaining of a rash having developed over the past 2 weeks on his left ankle. He notes that this is pruritic. He has been putting Betadine on this to help with the itching.  [name] also notes an issue of some chronic sinus drainage. He feels like this has been ongoing for the past 2 months (since he had back surgery). He has had some associated cough, for which he is using Robitussin.   Past Medical History: Patient Active Problem List   Diagnosis Date Noted   Spondylolisthesis of lumbar region 11/20/2021   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/2021   Lumbar spondylosis 10/20/2021   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-L5 level 10/20/2021   Primary osteoarthritis of left knee 08/14/2021   Chronic epididymitis 08/13/2020   Hives 07/11/2020   Rheumatoid arthritis (HCC) 07/11/2020   Osteoarthritis of left knee 07/11/2020   Cervical arthritis 07/11/2020   Gastroesophageal reflux disease 07/11/2020   Presbyopia of both eyes 04/20/2015   Pseudophakia of both eyes 04/20/2015   Past Surgical History:  Procedure Laterality Date   EYE SURGERY Bilateral    cataract extraction   HEMORRHOID SURGERY     LAMINECTOMY WITH POSTERIOR LATERAL ARTHRODESIS LEVEL 3 N/A 11/20/2021   Procedure: LUMBAR TWO THROUGH LUMBAR THREE, LUMBAR THREE THROUGH LUMBAR FOUR, LUMBAR FOUR THROUGH LUMBAR FIVE LAMINECTOMY WITH FACETECTOMY, POSTERIOR SEGMENTAL INSTRUMENTATION, POSTEROLATERAL ARTHRODESIS;  Surgeon:  Lisbeth Renshaw, MD;  Location: [facility];  Service: Neurosurgery;  Laterality: N/A;  3C   LYMPH NODE BIOPSY     Neck   TRIGGER FINGER RELEASE Right    3rd   Family History  Problem Relation Age of Onset   Rheum arthritis Mother    Diabetes Mother    Cancer Sister        Lung   Cancer Brother        Lung   Outpatient Medications Prior to Visit  Medication Sig Dispense Refill   acetaminophen (TYLENOL) 325 MG tablet Take 650 mg by mouth every 6 (six) hours as needed for moderate pain.     cetirizine (ZYRTEC) 10 MG tablet Take 10 mg by mouth at bedtime.     finasteride (PROSCAR) 5 MG tablet Take 5 mg by mouth daily.     folic acid (FOLVITE) 1 MG tablet Take 1 mg by mouth daily.     hydroxychloroquine (PLAQUENIL) 200 MG tablet Take 200 mg by mouth 2 (two) times daily.     loratadine (CLARITIN) 10 MG tablet Take 10 mg by mouth in the morning.     methocarbamol (ROBAXIN) 500 MG tablet Take 1 tablet (500 mg total) by mouth every 6 (six) hours as needed for muscle spasms. 90 tablet 0   methotrexate (RHEUMATREX) 2.5 MG tablet Take 15 mg by mouth every Saturday. Caution:Chemotherapy. Protect from light.     Multiple Vitamin (MULTIVITAMIN WITH MINERALS) TABS tablet Take 1 tablet by mouth daily.     Multiple Vitamins-Minerals (PRESERVISION AREDS 2) CAPS  Take 1 capsule by mouth in the morning and at bedtime.     omeprazole (PRILOSEC) 20 MG capsule Take 20 mg by mouth in the morning and at bedtime.     naproxen sodium (ALEVE) 220 MG tablet Take 220 mg by mouth daily as needed (pain). (Patient not taking: Reported on 01/18/2022)     No facility-administered medications prior to visit.   No Known Allergies    Objective:   Today's Vitals   01/18/22 1015  BP: 120/68  Pulse: 70  Temp: 97.8 F (36.6 C)  TempSrc: Temporal  SpO2: 99%  Weight: 197 lb 6.4 oz (89.5 kg)  Height: 5\' 8"  (1.727 m)   Body mass index is 30.01 kg/m.   General: Well developed, well nourished. No acute distress. HEENT:  Normocephalic, non-traumatic. Conjunctiva clear. External ears normal. EAC and TMs normal   bilaterally. Nasal passages are restricted. Turbinates are mildly swollen and pale. Scant rhinorrhea   noted. Minor pain on percussion over left maxillary sinus. Mucous membranes moist. Oropharynx clear.   Good dentition. Neck: Supple. No lymphadenopathy. No thyromegaly. Lungs: Clear to auscultation bilaterally. No wheezing, rales or rhonchi. Extremities: Trace-1+ edema of both lower legs Skin: There is a 6-7 cm square patch of rash over the medial aspect of the left lower leg/ankle. There eis   mild erythema and an ichthyotic scaliness present. No crusting or drainage. Psych: Alert and oriented. Normal mood and affect.  Health Maintenance Due  Topic Date Due   DTaP/Tdap/Td (1 - Tdap) Never done   Zoster Vaccines- Shingrix (1 of 2) Never done     Assessment & Plan:   1. Eczema, unspecified type The rash on the ankle appears consistent with an eczematous phase of stasis dermatitis. I will have him use a steroid cream and also apply a moisturizing lotion daily. I will plan to reassess this in 1 month.  - mometasone (ELOCON) 0.1 % cream; Apply topically daily.  Dispense: 15 g; Refill: 1  2. Chronic rhinitis [name] appears to have some chronic non-allergic rhinitis. I recommend he use daily nasal saline washes and we will add a nasal antihistamine to reduce rhinorrhea.  - azelastine (ASTELIN) 0.1 % nasal spray; Place 1 spray into both nostrils 2 (two) times daily.  Dispense: 30 mL; Refill: 12  Return in about 4 weeks (around 02/15/2022) for Reassessment.   Haydee Salter, MD

## 2022-01-21 ENCOUNTER — Ambulatory Visit: Payer: Medicare PPO | Admitting: Physical Therapy

## 2022-01-21 ENCOUNTER — Encounter: Payer: Self-pay | Admitting: Physical Therapy

## 2022-01-21 DIAGNOSIS — R262 Difficulty in walking, not elsewhere classified: Secondary | ICD-10-CM | POA: Diagnosis not present

## 2022-01-21 DIAGNOSIS — M6281 Muscle weakness (generalized): Secondary | ICD-10-CM | POA: Diagnosis not present

## 2022-01-21 DIAGNOSIS — R293 Abnormal posture: Secondary | ICD-10-CM | POA: Diagnosis not present

## 2022-01-21 DIAGNOSIS — M5459 Other low back pain: Secondary | ICD-10-CM | POA: Diagnosis not present

## 2022-01-21 DIAGNOSIS — M542 Cervicalgia: Secondary | ICD-10-CM | POA: Diagnosis not present

## 2022-01-21 NOTE — Therapy (Signed)
OUTPATIENT PHYSICAL THERAPY THORACOLUMBAR TREATMENT   Patient Name: Joshua Ruiz MRN: [id number] DOB:16-Aug-1940, 82 y.o., male Today's Date: 01/21/2022  END OF SESSION:  PT End of Session - 01/21/22 1439     Visit Number 4    Date for PT Re-Evaluation 02/28/22    Authorization Type [employer]    Authorization Time Period 3/12    PT Start Time 1440    PT Stop Time 1530    PT Time Calculation (min) 50 min    Activity Tolerance Patient tolerated treatment well    Behavior During Therapy WFL for tasks assessed/performed             Past Medical History:  Diagnosis Date   GERD (gastroesophageal reflux disease)    Rheumatoid arthritis (HCC)    Past Surgical History:  Procedure Laterality Date   EYE SURGERY Bilateral    cataract extraction   HEMORRHOID SURGERY     LAMINECTOMY WITH POSTERIOR LATERAL ARTHRODESIS LEVEL 3 N/A 11/20/2021   Procedure: LUMBAR TWO THROUGH LUMBAR THREE, LUMBAR THREE THROUGH LUMBAR FOUR, LUMBAR FOUR THROUGH LUMBAR FIVE LAMINECTOMY WITH FACETECTOMY, POSTERIOR SEGMENTAL INSTRUMENTATION, POSTEROLATERAL ARTHRODESIS;  Surgeon: Lisbeth Renshaw, MD;  Location: [facility];  Service: Neurosurgery;  Laterality: N/A;  3C   LYMPH NODE BIOPSY     Neck   TRIGGER FINGER RELEASE Right    3rd   Patient Active Problem List   Diagnosis Date Noted   Spondylolisthesis of lumbar region 11/20/2021   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/2021   Lumbar spondylosis 10/20/2021   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-L5 level 10/20/2021   Primary osteoarthritis of left knee 08/14/2021   Chronic epididymitis 08/13/2020   Hives 07/11/2020   Rheumatoid arthritis (HCC) 07/11/2020   Osteoarthritis of left knee 07/11/2020   Cervical arthritis 07/11/2020   Gastroesophageal reflux disease 07/11/2020   Presbyopia of both eyes 04/20/2015   Pseudophakia of both eyes 04/20/2015    PCP: Veto Kemps  REFERRING PROVIDER: Conchita Paris  REFERRING DIAG: s/p lumbar fusion L2-5  Rationale for  Evaluation and Treatment: Rehabilitation  THERAPY DIAG:  Other low back pain  Muscle weakness (generalized)  Difficulty in walking, not elsewhere classified  ONSET DATE: 11/20/21  SUBJECTIVE:                                                                                                                                                                                           SUBJECTIVE STATEMENT: Reports that he hasn't used the back brace in about 4 days, reports that he felt good without it, did go to the store yesterday and his back got tired and sore  PERTINENT HISTORY:  See above  PAIN:  Are you having pain? Yes: NPRS scale: 2/10 Pain location: low back Pain description: tender, tight, ache Aggravating factors: touching it, standing, shopping, gets tired "very quick" 5 minutes standing, pain up to 6/10 Relieving factors: sit rest  PRECAUTIONS: Back  WEIGHT BEARING RESTRICTIONS: No  FALLS:  Has patient fallen in last 6 months? No  LIVING ENVIRONMENT: Lives with: lives alone Lives in: House/apartment Stairs: No Has following equipment at home: None  OCCUPATION: retired  PLOF: Independent does own housework some gym a few days a week  PATIENT GOALS: feel better, better motions, less difficulty standing, pick up things  NEXT MD VISIT:  January 3  OBJECTIVE:   DIAGNOSTIC FINDINGS:  healing  PATIENT SURVEYS:  FOTO 33  SCREENING FOR RED FLAGS: Bowel or bladder incontinence: No Spinal tumors: No Cauda equina syndrome: No Compression fracture: No Abdominal aneurysm: No  COGNITION: Overall cognitive status: Within functional limits for tasks assessed     SENSATION: Mild numbness in the left thigh  MUSCLE LENGTH: Hamstrings: Right 50 deg; Left 40 deg  POSTURE: decreased lumbar lordosis  PALPATION: Very tight and tender in the lumbar paraspinals and the mild tenderness in the buttocks  LUMBAR ROM: Following BLT rules he is very stiff especially with  lateral motions   LOWER EXTREMITY ROM:   other motions guarded but WFL's  Active  Right eval Left eval  Hip flexion    Hip extension    Hip abduction    Hip adduction    Hip internal rotation    Hip external rotation    Knee flexion    Knee extension  -15  Ankle dorsiflexion    Ankle plantarflexion    Ankle inversion    Ankle eversion     (Blank rows = not tested)  LOWER EXTREMITY MMT:    MMT Right eval Left eval  Hip flexion 4 4-  Hip extension    Hip abduction 4- 4-  Hip adduction    Hip internal rotation    Hip external rotation    Knee flexion 4- 4-  Knee extension 4- 4-  Ankle dorsiflexion 4 4  Ankle plantarflexion    Ankle inversion    Ankle eversion     (Blank rows = not tested)  FUNCTIONAL TESTS:  5 times sit to stand: 20 seconds Timed up and go (TUG): 17 3 minute walk test: stopped at 2 minutes 360 feet  GAIT: Distance walked: 100 feet Assistive device utilized: None Level of assistance: Complete Independence Comments: first few steps the left leg really gave way and he stumbled has trendelenberg on the left, on stairs he did one step at a time  TODAY'S TREATMENT:                                                                                                                              DATE:   01/21/22 3 minute walk test 600 feet, sore and tired with some  SOB UBE level 3 x 4 minutes Bike LEvel 4 x 5 minutes 25# lats and Rows 2x10 each 5# chest press 2x10 20# leg press 2 x10 Leg curls 20# 2x10 LEg extension 5# 2x10 Feet on ball K2C, trunk rotation, posterior activation and isometric abs Passive HS and piriformis stretch  01/12/22 UBE level 3 x 4 minutes Nustep level 4 x 7 minutes SEated row 25# 2x10 Lats 25# 2x10 Chest press 10# 2x10 5# straight arm pulls 2x10 Leg curls 20# 2x10 Leg extension 5# 2x10 P ball rollouts Feet on ball K2C, trunk rotation, posterior activation, isometric abs HS and piriformis stretches  01/07/22 Nustep  level 5 x 6 minutes Leg curls 25# 2x15 Leg extension 5# 2x15 Seated row 25# 2x10 Lats 25# 2x10 Gait outside around the small parking island two rest breaks standing Hip adduction, extension and abduction 5# x 10 each Feet on ball K2C, small trunk rotaiton, posterior activation and isometric abs PROM HS, piriformis and calves   PATIENT EDUCATION:  Education details: POC Person educated: Patient Education method: Explanation Education comprehension: verbalized understanding  HOME EXERCISE PROGRAM: Reviewed HEP from the hospital  ASSESSMENT:  CLINICAL IMPRESSION: Patient able to go an extra minute and another 340 feet for the 3 minute walk, still SOB and fatigue and tightness in the low back.  He is very stiff and guarded with motions.  We did discuss some body mechanics for ADL's.   OBJECTIVE IMPAIRMENTS: Abnormal gait, cardiopulmonary status limiting activity, decreased activity tolerance, decreased balance, decreased endurance, decreased mobility, difficulty walking, decreased ROM, decreased strength, increased muscle spasms, impaired flexibility, improper body mechanics, postural dysfunction, and pain.   REHAB POTENTIAL: Good  CLINICAL DECISION MAKING: Evolving/moderate complexity  EVALUATION COMPLEXITY: Low   GOALS: Goals reviewed with patient? Yes  SHORT TERM GOALS: Target date: 01/10/22  Independent with initial HEP Goal status: met  LONG TERM GOALS: Target date: 03/31/22  Understand posture and body mechanics  Goal status: ongoing  2.  Decrease pain 50% Goal status: ongoing  3.  Increase lumbar ROM 25% Goal status: ongoing  4.  Walk 1000 feet  Goal status: progressing  5.  Be able to put on shoes and socks without difficulty Goal status: ongoing   PLAN:  PT FREQUENCY: 1-2x/week  PT DURATION: 12 weeks  PLANNED INTERVENTIONS: Therapeutic exercises, Therapeutic activity, Neuromuscular re-education, Balance training, Gait training, Patient/Family  education, Self Care, Joint mobilization, Electrical stimulation, Cryotherapy, Moist heat, and Manual therapy.  PLAN FOR NEXT SESSION: slowly increase work on stamina and strength and function  Lyllian Gause W, PT 01/21/2022, 2:42 PM

## 2022-01-26 ENCOUNTER — Ambulatory Visit: Payer: Medicare PPO | Admitting: Physical Therapy

## 2022-01-26 ENCOUNTER — Encounter: Payer: Self-pay | Admitting: Physical Therapy

## 2022-01-26 DIAGNOSIS — M6281 Muscle weakness (generalized): Secondary | ICD-10-CM | POA: Diagnosis not present

## 2022-01-26 DIAGNOSIS — R262 Difficulty in walking, not elsewhere classified: Secondary | ICD-10-CM

## 2022-01-26 DIAGNOSIS — M542 Cervicalgia: Secondary | ICD-10-CM

## 2022-01-26 DIAGNOSIS — R293 Abnormal posture: Secondary | ICD-10-CM | POA: Diagnosis not present

## 2022-01-26 DIAGNOSIS — M5459 Other low back pain: Secondary | ICD-10-CM | POA: Diagnosis not present

## 2022-01-26 NOTE — Therapy (Signed)
OUTPATIENT PHYSICAL THERAPY THORACOLUMBAR TREATMENT   Patient Name: Joshua Ruiz MRN: [id number] DOB:1940-09-23, 82 y.o., male Today's Date: 01/26/2022  END OF SESSION:  PT End of Session - 01/26/22 1449     Visit Number 5    Date for PT Re-Evaluation 02/28/22    Authorization Type [employer]    Authorization Time Period 4/12    PT Start Time 1440    PT Stop Time 1525    PT Time Calculation (min) 45 min    Activity Tolerance Patient tolerated treatment well    Behavior During Therapy WFL for tasks assessed/performed             Past Medical History:  Diagnosis Date   GERD (gastroesophageal reflux disease)    Rheumatoid arthritis ([place])    Past Surgical History:  Procedure Laterality Date   EYE SURGERY Bilateral    cataract extraction   HEMORRHOID SURGERY     LAMINECTOMY WITH POSTERIOR LATERAL ARTHRODESIS LEVEL 3 N/A 11/20/2021   Procedure: LUMBAR TWO THROUGH LUMBAR THREE, LUMBAR THREE THROUGH LUMBAR FOUR, LUMBAR FOUR THROUGH LUMBAR FIVE LAMINECTOMY WITH FACETECTOMY, POSTERIOR SEGMENTAL INSTRUMENTATION, POSTEROLATERAL ARTHRODESIS;  Surgeon: Consuella Lose, MD;  Location: [facility];  Service: Neurosurgery;  Laterality: N/A;  3C   LYMPH NODE BIOPSY     Neck   TRIGGER FINGER RELEASE Right    3rd   Patient Active Problem List   Diagnosis Date Noted   Spondylolisthesis of lumbar region 11/20/2021   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/2021   Lumbar spondylosis 10/20/2021   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-L5 level 10/20/2021   Primary osteoarthritis of left knee 08/14/2021   Chronic epididymitis 08/13/2020   Hives 07/11/2020   Rheumatoid arthritis ([place]) 07/11/2020   Osteoarthritis of left knee 07/11/2020   Cervical arthritis 07/11/2020   Gastroesophageal reflux disease 07/11/2020   Presbyopia of both eyes 04/20/2015   Pseudophakia of both eyes 04/20/2015    PCP: Gena Fray  REFERRING PROVIDER: Kathyrn Sheriff  REFERRING DIAG: s/p lumbar fusion L2-5  Rationale for  Evaluation and Treatment: Rehabilitation  THERAPY DIAG:  Other low back pain  Muscle weakness (generalized)  Difficulty in walking, not elsewhere classified  Cervicalgia  ONSET DATE: 11/20/21  SUBJECTIVE:                                                                                                                                                                                           SUBJECTIVE STATEMENT: Reports that after the last session he was very sore, he did not wear the brace for 4 days and not in PT, after 2 days back to normal  PERTINENT HISTORY:  See above  PAIN:  Are you having pain? Yes: NPRS scale: 3/10 Pain location: low back Pain description: tender, tight, ache Aggravating factors: touching it, standing, shopping, gets tired "very quick" 5 minutes standing, pain up to 6/10 Relieving factors: sit rest  PRECAUTIONS: Back  WEIGHT BEARING RESTRICTIONS: No  FALLS:  Has patient fallen in last 6 months? No  LIVING ENVIRONMENT: Lives with: lives alone Lives in: House/apartment Stairs: No Has following equipment at home: None  OCCUPATION: retired  PLOF: Independent does own housework some gym a few days a week  PATIENT GOALS: feel better, better motions, less difficulty standing, pick up things  NEXT MD VISIT:  January 3  OBJECTIVE:   DIAGNOSTIC FINDINGS:  healing  PATIENT SURVEYS:  FOTO 33  SCREENING FOR RED FLAGS: Bowel or bladder incontinence: No Spinal tumors: No Cauda equina syndrome: No Compression fracture: No Abdominal aneurysm: No  COGNITION: Overall cognitive status: Within functional limits for tasks assessed     SENSATION: Mild numbness in the left thigh  MUSCLE LENGTH: Hamstrings: Right 50 deg; Left 40 deg  POSTURE: decreased lumbar lordosis  PALPATION: Very tight and tender in the lumbar paraspinals and the mild tenderness in the buttocks  LUMBAR ROM: Following BLT rules he is very stiff especially with lateral  motions   LOWER EXTREMITY ROM:   other motions guarded but WFL's  Active  Right eval Left eval  Hip flexion    Hip extension    Hip abduction    Hip adduction    Hip internal rotation    Hip external rotation    Knee flexion    Knee extension  -15  Ankle dorsiflexion    Ankle plantarflexion    Ankle inversion    Ankle eversion     (Blank rows = not tested)  LOWER EXTREMITY MMT:    MMT Right eval Left eval  Hip flexion 4 4-  Hip extension    Hip abduction 4- 4-  Hip adduction    Hip internal rotation    Hip external rotation    Knee flexion 4- 4-  Knee extension 4- 4-  Ankle dorsiflexion 4 4  Ankle plantarflexion    Ankle inversion    Ankle eversion     (Blank rows = not tested)  FUNCTIONAL TESTS:  5 times sit to stand: 20 seconds Timed up and go (TUG): 17 3 minute walk test: stopped at 2 minutes 360 feet  GAIT: Distance walked: 100 feet Assistive device utilized: None Level of assistance: Complete Independence Comments: first few steps the left leg really gave way and he stumbled has trendelenberg on the left, on stairs he did one step at a time  TODAY'S TREATMENT:                                                                                                                              DATE:   01/28/22 Nustep levl 5 x 6 minutes Bike LEvel 4 x 5  minutes Seated Row 25#  Lats 25# Chest press 10# Leg press 20# 3x10 LEg extension 5# 3x10 LEg curls 25# 3x10 10# straight arm pulls Passive HS and piriformis stretch Feet on ball K2C   01/21/22 3 minute walk test 600 feet, sore and tired with some SOB UBE level 3 x 4 minutes Bike LEvel 4 x 5 minutes 25# lats and Rows 2x10 each 5# chest press 2x10 20# leg press 2 x10 Leg curls 20# 2x10 LEg extension 5# 2x10 Feet on ball K2C, trunk rotation, posterior activation and isometric abs Passive HS and piriformis stretch  01/12/22 UBE level 3 x 4 minutes Nustep level 4 x 7 minutes SEated row 25# 2x10 Lats  25# 2x10 Chest press 10# 2x10 5# straight arm pulls 2x10 Leg curls 20# 2x10 Leg extension 5# 2x10 P ball rollouts Feet on ball K2C, trunk rotation, posterior activation, isometric abs HS and piriformis stretches  01/07/22 Nustep level 5 x 6 minutes Leg curls 25# 2x15 Leg extension 5# 2x15 Seated row 25# 2x10 Lats 25# 2x10 Gait outside around the small parking [place] two rest breaks standing Hip adduction, extension and abduction 5# x 10 each Feet on ball K2C, small trunk rotaiton, posterior activation and isometric abs PROM HS, piriformis and calves   PATIENT EDUCATION:  Education details: POC Person educated: Patient Education method: Explanation Education comprehension: verbalized understanding  HOME EXERCISE PROGRAM: Reviewed HEP from the hospital  ASSESSMENT:  CLINICAL IMPRESSION: Patient reports he was very sore after the last treatment but he did not wear his brace for about 4 days, back in it and feeling okay just very stiff, he is very gaurded with his motions  OBJECTIVE IMPAIRMENTS: Abnormal gait, cardiopulmonary status limiting activity, decreased activity tolerance, decreased balance, decreased endurance, decreased mobility, difficulty walking, decreased ROM, decreased strength, increased muscle spasms, impaired flexibility, improper body mechanics, postural dysfunction, and pain.   REHAB POTENTIAL: Good  CLINICAL DECISION MAKING: Evolving/moderate complexity  EVALUATION COMPLEXITY: Low   GOALS: Goals reviewed with patient? Yes  SHORT TERM GOALS: Target date: 01/10/22  Independent with initial HEP Goal status: met  LONG TERM GOALS: Target date: 03/31/22  Understand posture and body mechanics  Goal status: ongoing  2.  Decrease pain 50% Goal status: ongoing  3.  Increase lumbar ROM 25% Goal status: ongoing  4.  Walk 1000 feet  Goal status: progressing  5.  Be able to put on shoes and socks without difficulty Goal status:  ongoing   PLAN:  PT FREQUENCY: 1-2x/week  PT DURATION: 12 weeks  PLANNED INTERVENTIONS: Therapeutic exercises, Therapeutic activity, Neuromuscular re-education, Balance training, Gait training, Patient/Family education, Self Care, Joint mobilization, Electrical stimulation, Cryotherapy, Moist heat, and Manual therapy.  PLAN FOR NEXT SESSION: slowly increase work on stamina and strength and function  Yesennia Hirota W, PT 01/26/2022, 2:51 PM

## 2022-01-28 ENCOUNTER — Ambulatory Visit: Payer: Medicare PPO | Admitting: Physical Therapy

## 2022-02-02 ENCOUNTER — Ambulatory Visit: Payer: Medicare PPO | Admitting: Physical Therapy

## 2022-02-04 ENCOUNTER — Encounter: Payer: Self-pay | Admitting: Physical Therapy

## 2022-02-04 ENCOUNTER — Ambulatory Visit: Payer: Medicare PPO | Admitting: Physical Therapy

## 2022-02-04 DIAGNOSIS — M542 Cervicalgia: Secondary | ICD-10-CM | POA: Diagnosis not present

## 2022-02-04 DIAGNOSIS — M6281 Muscle weakness (generalized): Secondary | ICD-10-CM

## 2022-02-04 DIAGNOSIS — R262 Difficulty in walking, not elsewhere classified: Secondary | ICD-10-CM

## 2022-02-04 DIAGNOSIS — M5459 Other low back pain: Secondary | ICD-10-CM | POA: Diagnosis not present

## 2022-02-04 DIAGNOSIS — R293 Abnormal posture: Secondary | ICD-10-CM

## 2022-02-04 NOTE — Therapy (Signed)
OUTPATIENT PHYSICAL THERAPY THORACOLUMBAR TREATMENT   Patient Name: Joshua Ruiz MRN: [id number] DOB:Jan 18, 1940, 82 y.o., male Today's Date: 02/04/2022  END OF SESSION:  PT End of Session - 02/04/22 1439     Visit Number 6    Date for PT Re-Evaluation 02/28/22    Authorization Type [employer]    Authorization Time Period 5/12    PT Start Time 1435    PT Stop Time 1530    PT Time Calculation (min) 55 min    Activity Tolerance Patient tolerated treatment well    Behavior During Therapy WFL for tasks assessed/performed             Past Medical History:  Diagnosis Date   GERD (gastroesophageal reflux disease)    Rheumatoid arthritis ([place])    Past Surgical History:  Procedure Laterality Date   EYE SURGERY Bilateral    cataract extraction   HEMORRHOID SURGERY     LAMINECTOMY WITH POSTERIOR LATERAL ARTHRODESIS LEVEL 3 N/A 11/20/2021   Procedure: LUMBAR TWO THROUGH LUMBAR THREE, LUMBAR THREE THROUGH LUMBAR FOUR, LUMBAR FOUR THROUGH LUMBAR FIVE LAMINECTOMY WITH FACETECTOMY, POSTERIOR SEGMENTAL INSTRUMENTATION, POSTEROLATERAL ARTHRODESIS;  Surgeon: Consuella Lose, MD;  Location: [place];  Service: Neurosurgery;  Laterality: N/A;  3C   LYMPH NODE BIOPSY     Neck   TRIGGER FINGER RELEASE Right    3rd   Patient Active Problem List   Diagnosis Date Noted   Spondylolisthesis of lumbar region 11/20/2021   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/2021   Lumbar spondylosis 10/20/2021   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-L5 level 10/20/2021   Primary osteoarthritis of left knee 08/14/2021   Chronic epididymitis 08/13/2020   Hives 07/11/2020   Rheumatoid arthritis ([place]) 07/11/2020   Osteoarthritis of left knee 07/11/2020   Cervical arthritis 07/11/2020   Gastroesophageal reflux disease 07/11/2020   Presbyopia of both eyes 04/20/2015   Pseudophakia of both eyes 04/20/2015    PCP: Gena Fray  REFERRING PROVIDER: Kathyrn Sheriff  REFERRING DIAG: s/p lumbar fusion L2-5  Rationale for  Evaluation and Treatment: Rehabilitation  THERAPY DIAG:  Other low back pain  Muscle weakness (generalized)  Difficulty in walking, not elsewhere classified  Abnormal posture  ONSET DATE: 11/20/21  SUBJECTIVE:                                                                                                                                                                                           SUBJECTIVE STATEMENT: REports that he has tried to increase his activity level, did a little cutting of some rose bushes yesterday but wore his brace, reports some soreness  PERTINENT HISTORY:  See above  PAIN:  Are you having pain? Yes: NPRS scale: 3/10 Pain location: low back Pain description: tender, tight, ache Aggravating factors: touching it, standing, shopping, gets tired "very quick" 5 minutes standing, pain up to 6/10 Relieving factors: sit rest  PRECAUTIONS: Back  WEIGHT BEARING RESTRICTIONS: No  FALLS:  Has patient fallen in last 6 months? No  LIVING ENVIRONMENT: Lives with: lives alone Lives in: House/apartment Stairs: No Has following equipment at home: None  OCCUPATION: retired  PLOF: Independent does own housework some gym a few days a week  PATIENT GOALS: feel better, better motions, less difficulty standing, pick up things  NEXT MD VISIT:  January 3  OBJECTIVE:   DIAGNOSTIC FINDINGS:  healing  PATIENT SURVEYS:  FOTO 33  SCREENING FOR RED FLAGS: Bowel or bladder incontinence: No Spinal tumors: No Cauda equina syndrome: No Compression fracture: No Abdominal aneurysm: No  COGNITION: Overall cognitive status: Within functional limits for tasks assessed     SENSATION: Mild numbness in the left thigh  MUSCLE LENGTH: Hamstrings: Right 50 deg; Left 40 deg  POSTURE: decreased lumbar lordosis  PALPATION: Very tight and tender in the lumbar paraspinals and the mild tenderness in the buttocks  LUMBAR ROM: Following BLT rules he is very stiff  especially with lateral motions   LOWER EXTREMITY ROM:   other motions guarded but WFL's  Active  Right eval Left eval  Hip flexion    Hip extension    Hip abduction    Hip adduction    Hip internal rotation    Hip external rotation    Knee flexion    Knee extension  -15  Ankle dorsiflexion    Ankle plantarflexion    Ankle inversion    Ankle eversion     (Blank rows = not tested)  LOWER EXTREMITY MMT:    MMT Right eval Left eval  Hip flexion 4 4-  Hip extension    Hip abduction 4- 4-  Hip adduction    Hip internal rotation    Hip external rotation    Knee flexion 4- 4-  Knee extension 4- 4-  Ankle dorsiflexion 4 4  Ankle plantarflexion    Ankle inversion    Ankle eversion     (Blank rows = not tested)  FUNCTIONAL TESTS:  5 times sit to stand: 20 seconds Timed up and go (TUG): 17 3 minute walk test: stopped at 2 minutes 360 feet  GAIT: Distance walked: 100 feet Assistive device utilized: None Level of assistance: Complete Independence Comments: first few steps the left leg really gave way and he stumbled has trendelenberg on the left, on stairs he did one step at a time  TODAY'S TREATMENT:                                                                                                                              DATE:   02/04/22 Nustep level 5 x 6 mintues Bike level 4 x 6 minutes 10#  Straight arm pulls 2x10 15# AR press 25# HS curls 3x10 5# leg extension 2x10 20# rows 2x10 20# lats Feet on ball K2C, trunk rotation, posterior activation, isometric abs Passive stretch of the Hs and the piriformis 4 minute walk 6 laps = 700 feet  01/28/22 Nustep levl 5 x 6 minutes Bike LEvel 4 x 5 minutes Seated Row 25#  Lats 25# Chest press 10# Leg press 20# 3x10 LEg extension 5# 3x10 LEg curls 25# 3x10 10# straight arm pulls Passive HS and piriformis stretch Feet on ball K2C   01/21/22 3 minute walk test 600 feet, sore and tired with some SOB UBE level 3 x  4 minutes Bike LEvel 4 x 5 minutes 25# lats and Rows 2x10 each 5# chest press 2x10 20# leg press 2 x10 Leg curls 20# 2x10 LEg extension 5# 2x10 Feet on ball K2C, trunk rotation, posterior activation and isometric abs Passive HS and piriformis stretch  01/12/22 UBE level 3 x 4 minutes Nustep level 4 x 7 minutes SEated row 25# 2x10 Lats 25# 2x10 Chest press 10# 2x10 5# straight arm pulls 2x10 Leg curls 20# 2x10 Leg extension 5# 2x10 P ball rollouts Feet on ball K2C, trunk rotation, posterior activation, isometric abs HS and piriformis stretches  01/07/22 Nustep level 5 x 6 minutes Leg curls 25# 2x15 Leg extension 5# 2x15 Seated row 25# 2x10 Lats 25# 2x10 Gait outside around the small parking island two rest breaks standing Hip adduction, extension and abduction 5# x 10 each Feet on ball K2C, small trunk rotaiton, posterior activation and isometric abs PROM HS, piriformis and calves   PATIENT EDUCATION:  Education details: POC Person educated: Patient Education method: Explanation Education comprehension: verbalized understanding  HOME EXERCISE PROGRAM: Reviewed HEP from the hospital  ASSESSMENT:  CLINICAL IMPRESSION: Patient reports he has been sore but he is doing more and he is trying to wear the brace when he does increased activity, he is slow and guarded with motions.  Still fatigues easily, during walk today really needed to sit at 3 minutes 45 seconds    OBJECTIVE IMPAIRMENTS: Abnormal gait, cardiopulmonary status limiting activity, decreased activity tolerance, decreased balance, decreased endurance, decreased mobility, difficulty walking, decreased ROM, decreased strength, increased muscle spasms, impaired flexibility, improper body mechanics, postural dysfunction, and pain.   REHAB POTENTIAL: Good  CLINICAL DECISION MAKING: Evolving/moderate complexity  EVALUATION COMPLEXITY: Low   GOALS: Goals reviewed with patient? Yes  SHORT TERM GOALS: Target  date: 01/10/22  Independent with initial HEP Goal status: met  LONG TERM GOALS: Target date: 03/31/22  Understand posture and body mechanics  Goal status: ongoing  2.  Decrease pain 50% Goal status: progressing  3.  Increase lumbar ROM 25% Goal status: ongoing  4.  Walk 1000 feet  Goal status: progressing  5.  Be able to put on shoes and socks without difficulty Goal status: ongoing   PLAN:  PT FREQUENCY: 1-2x/week  PT DURATION: 12 weeks  PLANNED INTERVENTIONS: Therapeutic exercises, Therapeutic activity, Neuromuscular re-education, Balance training, Gait training, Patient/Family education, Self Care, Joint mobilization, Electrical stimulation, Cryotherapy, Moist heat, and Manual therapy.  PLAN FOR NEXT SESSION: slowly increase work on stamina and strength and function  Alfonzia Woolum W, PT 02/04/2022, 2:42 PM

## 2022-02-09 ENCOUNTER — Encounter: Payer: Self-pay | Admitting: Physical Therapy

## 2022-02-09 ENCOUNTER — Ambulatory Visit: Payer: Medicare PPO | Admitting: Physical Therapy

## 2022-02-09 DIAGNOSIS — R293 Abnormal posture: Secondary | ICD-10-CM | POA: Diagnosis not present

## 2022-02-09 DIAGNOSIS — M6281 Muscle weakness (generalized): Secondary | ICD-10-CM | POA: Diagnosis not present

## 2022-02-09 DIAGNOSIS — M5459 Other low back pain: Secondary | ICD-10-CM | POA: Diagnosis not present

## 2022-02-09 DIAGNOSIS — R262 Difficulty in walking, not elsewhere classified: Secondary | ICD-10-CM

## 2022-02-09 DIAGNOSIS — M542 Cervicalgia: Secondary | ICD-10-CM | POA: Diagnosis not present

## 2022-02-09 NOTE — Therapy (Signed)
OUTPATIENT PHYSICAL THERAPY THORACOLUMBAR TREATMENT   Patient Name: Joshua Ruiz MRN: [id number] DOB:[birth year], 82 y.o., male Today's Date: 02/09/2022  END OF SESSION:  PT End of Session - 02/09/22 1446     Visit Number 7    Date for PT Re-Evaluation 02/28/22    Authorization Type [employer]    Authorization Time Period 6/12    PT Start Time 1443    PT Stop Time 1528    PT Time Calculation (min) 45 min    Activity Tolerance Patient tolerated treatment well    Behavior During Therapy WFL for tasks assessed/performed             Past Medical History:  Diagnosis Date   GERD (gastroesophageal reflux disease)    Rheumatoid arthritis ([place])    Past Surgical History:  Procedure Laterality Date   EYE SURGERY Bilateral    cataract extraction   HEMORRHOID SURGERY     LAMINECTOMY WITH POSTERIOR LATERAL ARTHRODESIS LEVEL 3 N/A 11/20/2021   Procedure: LUMBAR TWO THROUGH LUMBAR THREE, LUMBAR THREE THROUGH LUMBAR FOUR, LUMBAR FOUR THROUGH LUMBAR FIVE LAMINECTOMY WITH FACETECTOMY, POSTERIOR SEGMENTAL INSTRUMENTATION, POSTEROLATERAL ARTHRODESIS;  Surgeon: Consuella Lose, MD;  Location: [place];  Service: Neurosurgery;  Laterality: N/A;  3C   LYMPH NODE BIOPSY     Neck   TRIGGER FINGER RELEASE Right    3rd   Patient Active Problem List   Diagnosis Date Noted   Spondylolisthesis of lumbar region 11/20/2021   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/2021   Lumbar spondylosis 10/20/2021   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-L5 level 10/20/2021   Primary osteoarthritis of left knee 08/14/2021   Chronic epididymitis 08/13/2020   Hives 07/11/2020   Rheumatoid arthritis ([place]) 07/11/2020   Osteoarthritis of left knee 07/11/2020   Cervical arthritis 07/11/2020   Gastroesophageal reflux disease 07/11/2020   Presbyopia of both eyes 04/20/2015   Pseudophakia of both eyes 04/20/2015    PCP: Gena Fray  REFERRING PROVIDER: Kathyrn Sheriff  REFERRING DIAG: s/p lumbar fusion L2-5  Rationale for  Evaluation and Treatment: Rehabilitation  THERAPY DIAG:  Other low back pain  Muscle weakness (generalized)  Difficulty in walking, not elsewhere classified  Abnormal posture  ONSET DATE: 11/20/21  SUBJECTIVE:                                                                                                                                                                                           SUBJECTIVE STATEMENT: REports fatigue, I may be over doing it, I had to get a new fridge on Friday, I had to take all the stuff out, move from furniture and then put all the stuff back  in PERTINENT HISTORY:  See above  PAIN:  Are you having pain? Yes: NPRS scale: 2/10 Pain location: low back Pain description: tender, tight, ache Aggravating factors: touching it, standing, shopping, gets tired "very quick" 5 minutes standing, pain up to 6/10 Relieving factors: sit rest  PRECAUTIONS: Back  WEIGHT BEARING RESTRICTIONS: No  FALLS:  Has patient fallen in last 6 months? No  LIVING ENVIRONMENT: Lives with: lives alone Lives in: House/apartment Stairs: No Has following equipment at home: None  OCCUPATION: retired  PLOF: Independent does own housework some gym a few days a week  PATIENT GOALS: feel better, better motions, less difficulty standing, pick up things  NEXT MD VISIT:  January 3  OBJECTIVE:   DIAGNOSTIC FINDINGS:  healing  PATIENT SURVEYS:  FOTO 33  SCREENING FOR RED FLAGS: Bowel or bladder incontinence: No Spinal tumors: No Cauda equina syndrome: No Compression fracture: No Abdominal aneurysm: No  COGNITION: Overall cognitive status: Within functional limits for tasks assessed     SENSATION: Mild numbness in the left thigh  MUSCLE LENGTH: Hamstrings: Right 50 deg; Left 40 deg  POSTURE: decreased lumbar lordosis  PALPATION: Very tight and tender in the lumbar paraspinals and the mild tenderness in the buttocks  LUMBAR ROM: Following BLT rules he is  very stiff especially with lateral motions   LOWER EXTREMITY ROM:   other motions guarded but WFL's  Active  Right eval Left eval  Hip flexion    Hip extension    Hip abduction    Hip adduction    Hip internal rotation    Hip external rotation    Knee flexion    Knee extension  -15  Ankle dorsiflexion    Ankle plantarflexion    Ankle inversion    Ankle eversion     (Blank rows = not tested)  LOWER EXTREMITY MMT:    MMT Right eval Left eval  Hip flexion 4 4-  Hip extension    Hip abduction 4- 4-  Hip adduction    Hip internal rotation    Hip external rotation    Knee flexion 4- 4-  Knee extension 4- 4-  Ankle dorsiflexion 4 4  Ankle plantarflexion    Ankle inversion    Ankle eversion     (Blank rows = not tested)  FUNCTIONAL TESTS:  5 times sit to stand: 20 seconds Timed up and go (TUG): 17 3 minute walk test: stopped at 2 minutes 360 feet  GAIT: Distance walked: 100 feet Assistive device utilized: None Level of assistance: Complete Independence Comments: first few steps the left leg really gave way and he stumbled has trendelenberg on the left, on stairs he did one step at a time  TODAY'S TREATMENT:                                                                                                                              DATE:   02/09/22 Nustep level 5 x  5 minutes Walk outside lap around the building in the back 3 standing rest breaks Seated rows 35# LAts 35# 2x10 10# straight arm pulls cues for core activation 15# AR press 15# chest press 2x10 Passive LE stretches  02/04/22 Nustep level 5 x 6 mintues Bike level 4 x 6 minutes 10# Straight arm pulls 2x10 15# AR press 25# HS curls 3x10 5# leg extension 2x10 20# rows 2x10 20# lats Feet on ball K2C, trunk rotation, posterior activation, isometric abs Passive stretch of the Hs and the piriformis 4 minute walk 6 laps = 700 feet  01/28/22 Nustep levl 5 x 6 minutes Bike LEvel 4 x 5 minutes Seated  Row 25#  Lats 25# Chest press 10# Leg press 20# 3x10 LEg extension 5# 3x10 LEg curls 25# 3x10 10# straight arm pulls Passive HS and piriformis stretch Feet on ball K2C   01/21/22 3 minute walk test 600 feet, sore and tired with some SOB UBE level 3 x 4 minutes Bike LEvel 4 x 5 minutes 25# lats and Rows 2x10 each 5# chest press 2x10 20# leg press 2 x10 Leg curls 20# 2x10 LEg extension 5# 2x10 Feet on ball K2C, trunk rotation, posterior activation and isometric abs Passive HS and piriformis stretch  PATIENT EDUCATION:  Education details: POC Person educated: Patient Education method: Explanation Education comprehension: verbalized understanding  HOME EXERCISE PROGRAM: Reviewed HEP from the hospital  ASSESSMENT:  CLINICAL IMPRESSION: Patient remains slow and guarded with his motions, we did a large walk today but her required 3 standing rest breaks due to fatigue.  He did have a little bit of soreness with the AR press. He does report increased activity at home.   He does have some knee pain that may limit him   OBJECTIVE IMPAIRMENTS: Abnormal gait, cardiopulmonary status limiting activity, decreased activity tolerance, decreased balance, decreased endurance, decreased mobility, difficulty walking, decreased ROM, decreased strength, increased muscle spasms, impaired flexibility, improper body mechanics, postural dysfunction, and pain.   REHAB POTENTIAL: Good  CLINICAL DECISION MAKING: Evolving/moderate complexity  EVALUATION COMPLEXITY: Low   GOALS: Goals reviewed with patient? Yes  SHORT TERM GOALS: Target date: 01/10/22  Independent with initial HEP Goal status: met  LONG TERM GOALS: Target date: 03/31/22  Understand posture and body mechanics  Goal status: ongoing  2.  Decrease pain 50% Goal status: progressing  3.  Increase lumbar ROM 25% Goal status: ongoing  4.  Walk 1000 feet  Goal status: progressing  5.  Be able to put on shoes and socks  without difficulty Goal status: ongoing   PLAN:  PT FREQUENCY: 1-2x/week  PT DURATION: 12 weeks  PLANNED INTERVENTIONS: Therapeutic exercises, Therapeutic activity, Neuromuscular re-education, Balance training, Gait training, Patient/Family education, Self Care, Joint mobilization, Electrical stimulation, Cryotherapy, Moist heat, and Manual therapy.  PLAN FOR NEXT SESSION: continue to push his functional abilities  Sumner Boast, PT 02/09/2022, 2:48 PM

## 2022-02-11 ENCOUNTER — Ambulatory Visit: Payer: Medicare PPO | Attending: Neurosurgery | Admitting: Physical Therapy

## 2022-02-11 ENCOUNTER — Encounter: Payer: Self-pay | Admitting: Physical Therapy

## 2022-02-11 DIAGNOSIS — R293 Abnormal posture: Secondary | ICD-10-CM | POA: Diagnosis not present

## 2022-02-11 DIAGNOSIS — M5459 Other low back pain: Secondary | ICD-10-CM

## 2022-02-11 DIAGNOSIS — R262 Difficulty in walking, not elsewhere classified: Secondary | ICD-10-CM

## 2022-02-11 DIAGNOSIS — M6281 Muscle weakness (generalized): Secondary | ICD-10-CM | POA: Diagnosis not present

## 2022-02-11 NOTE — Therapy (Signed)
OUTPATIENT PHYSICAL THERAPY THORACOLUMBAR TREATMENT   Patient Name: Joshua Ruiz MRN: [id number] DOB:Apr 28, 1940, 82 y.o., male Today's Date: 02/11/2022  END OF SESSION:  PT End of Session - 02/11/22 1445     Visit Number 8    Date for PT Re-Evaluation 02/28/22    Authorization Type [employer]    Authorization Time Period 7/12    PT Start Time 1442    PT Stop Time 1528    PT Time Calculation (min) 46 min    Activity Tolerance Patient tolerated treatment well    Behavior During Therapy WFL for tasks assessed/performed             Past Medical History:  Diagnosis Date   GERD (gastroesophageal reflux disease)    Rheumatoid arthritis ([place])    Past Surgical History:  Procedure Laterality Date   EYE SURGERY Bilateral    cataract extraction   HEMORRHOID SURGERY     LAMINECTOMY WITH POSTERIOR LATERAL ARTHRODESIS LEVEL 3 N/A 11/20/2021   Procedure: LUMBAR TWO THROUGH LUMBAR THREE, LUMBAR THREE THROUGH LUMBAR FOUR, LUMBAR FOUR THROUGH LUMBAR FIVE LAMINECTOMY WITH FACETECTOMY, POSTERIOR SEGMENTAL INSTRUMENTATION, POSTEROLATERAL ARTHRODESIS;  Surgeon: Consuella Lose, MD;  Location: [facility];  Service: Neurosurgery;  Laterality: N/A;  3C   LYMPH NODE BIOPSY     Neck   TRIGGER FINGER RELEASE Right    3rd   Patient Active Problem List   Diagnosis Date Noted   Spondylolisthesis of lumbar region 11/20/2021   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/2021   Lumbar spondylosis 10/20/2021   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-L5 level 10/20/2021   Primary osteoarthritis of left knee 08/14/2021   Chronic epididymitis 08/13/2020   Hives 07/11/2020   Rheumatoid arthritis ([place]) 07/11/2020   Osteoarthritis of left knee 07/11/2020   Cervical arthritis 07/11/2020   Gastroesophageal reflux disease 07/11/2020   Presbyopia of both eyes 04/20/2015   Pseudophakia of both eyes 04/20/2015    PCP: Gena Fray  REFERRING PROVIDER: Kathyrn Sheriff  REFERRING DIAG: s/p lumbar fusion L2-5  Rationale for  Evaluation and Treatment: Rehabilitation  THERAPY DIAG:  Other low back pain  Muscle weakness (generalized)  Difficulty in walking, not elsewhere classified  Abnormal posture  ONSET DATE: 11/20/21  SUBJECTIVE:                                                                                                                                                                                           SUBJECTIVE STATEMENT: Reports that he was pretty fatigued after the last visit, denies any increase of pain  PERTINENT HISTORY:  See above  PAIN:  Are you having pain? Yes: NPRS scale: 2/10 Pain location: low  back Pain description: tender, tight, ache Aggravating factors: touching it, standing, shopping, gets tired "very quick" 5 minutes standing, pain up to 6/10 Relieving factors: sit rest  PRECAUTIONS: Back  WEIGHT BEARING RESTRICTIONS: No  FALLS:  Has patient fallen in last 6 months? No  LIVING ENVIRONMENT: Lives with: lives alone Lives in: House/apartment Stairs: No Has following equipment at home: None  OCCUPATION: retired  PLOF: Independent does own housework some gym a few days a week  PATIENT GOALS: feel better, better motions, less difficulty standing, pick up things  NEXT MD VISIT:  January 3  OBJECTIVE:   DIAGNOSTIC FINDINGS:  healing  PATIENT SURVEYS:  FOTO 33  SCREENING FOR RED FLAGS: Bowel or bladder incontinence: No Spinal tumors: No Cauda equina syndrome: No Compression fracture: No Abdominal aneurysm: No  COGNITION: Overall cognitive status: Within functional limits for tasks assessed     SENSATION: Mild numbness in the left thigh  MUSCLE LENGTH: Hamstrings: Right 50 deg; Left 40 deg  POSTURE: decreased lumbar lordosis  PALPATION: Very tight and tender in the lumbar paraspinals and the mild tenderness in the buttocks  LUMBAR ROM: Following BLT rules he is very stiff especially with lateral motions   LOWER EXTREMITY ROM:   other  motions guarded but WFL's  Active  Right eval Left eval  Hip flexion    Hip extension    Hip abduction    Hip adduction    Hip internal rotation    Hip external rotation    Knee flexion    Knee extension  -15  Ankle dorsiflexion    Ankle plantarflexion    Ankle inversion    Ankle eversion     (Blank rows = not tested)  LOWER EXTREMITY MMT:    MMT Right eval Left eval  Hip flexion 4 4-  Hip extension    Hip abduction 4- 4-  Hip adduction    Hip internal rotation    Hip external rotation    Knee flexion 4- 4-  Knee extension 4- 4-  Ankle dorsiflexion 4 4  Ankle plantarflexion    Ankle inversion    Ankle eversion     (Blank rows = not tested)  FUNCTIONAL TESTS:  5 times sit to stand: 20 seconds Timed up and go (TUG): 17 3 minute walk test: stopped at 2 minutes 360 feet  GAIT: Distance walked: 100 feet Assistive device utilized: None Level of assistance: Complete Independence Comments: first few steps the left leg really gave way and he stumbled has trendelenberg on the left, on stairs he did one step at a time  TODAY'S TREATMENT:                                                                                                                              DATE:   02/11/22 Nustep level 5 x 5 minutes Walk outside around the back building 2 rest breaks 35# lats 2x10 35# rows 2x10 15# chest press  2x10 10# straight arm pulls 10# AR press Feet on ball K2C, trunk rotation, small posterior activation, isometric abs Passive stretch of the LE's  02/09/22 Nustep level 5 x 5 minutes Walk outside lap around the building in the back 3 standing rest breaks Seated rows 35# LAts 35# 2x10 10# straight arm pulls cues for core activation 15# AR press 15# chest press 2x10 Passive LE stretches  02/04/22 Nustep level 5 x 6 mintues Bike level 4 x 6 minutes 10# Straight arm pulls 2x10 15# AR press 25# HS curls 3x10 5# leg extension 2x10 20# rows 2x10 20# lats Feet on  ball K2C, trunk rotation, posterior activation, isometric abs Passive stretch of the Hs and the piriformis 4 minute walk 6 laps = 700 feet  01/28/22 Nustep levl 5 x 6 minutes Bike LEvel 4 x 5 minutes Seated Row 25#  Lats 25# Chest press 10# Leg press 20# 3x10 LEg extension 5# 3x10 LEg curls 25# 3x10 10# straight arm pulls Passive HS and piriformis stretch Feet on ball K2C   01/21/22 3 minute walk test 600 feet, sore and tired with some SOB UBE level 3 x 4 minutes Bike LEvel 4 x 5 minutes 25# lats and Rows 2x10 each 5# chest press 2x10 20# leg press 2 x10 Leg curls 20# 2x10 LEg extension 5# 2x10 Feet on ball K2C, trunk rotation, posterior activation and isometric abs Passive HS and piriformis stretch  PATIENT EDUCATION:  Education details: POC Person educated: Patient Education method: Explanation Education comprehension: verbalized understanding  HOME EXERCISE PROGRAM: Reviewed HEP from the hospital  ASSESSMENT:  CLINICAL IMPRESSION: Patient remains slow and guarded with his motions, we did a large walk today and required 2 rest breaks.  We decreased the weight on the AR press and he thought this was better.  LE's are tight, does need cues for the posture during exercises  OBJECTIVE IMPAIRMENTS: Abnormal gait, cardiopulmonary status limiting activity, decreased activity tolerance, decreased balance, decreased endurance, decreased mobility, difficulty walking, decreased ROM, decreased strength, increased muscle spasms, impaired flexibility, improper body mechanics, postural dysfunction, and pain.   REHAB POTENTIAL: Good  CLINICAL DECISION MAKING: Evolving/moderate complexity  EVALUATION COMPLEXITY: Low   GOALS: Goals reviewed with patient? Yes  SHORT TERM GOALS: Target date: 01/10/22  Independent with initial HEP Goal status: met  LONG TERM GOALS: Target date: 03/31/22  Understand posture and body mechanics  Goal status: ongoing  2.  Decrease pain  50% Goal status: progressing 02/11/22  3.  Increase lumbar ROM 25% Goal status: ongoing 02/11/22  4.  Walk 1000 feet  Goal status: progressing  5.  Be able to put on shoes and socks without difficulty Goal status: ongoing   PLAN:  PT FREQUENCY: 1-2x/week  PT DURATION: 12 weeks  PLANNED INTERVENTIONS: Therapeutic exercises, Therapeutic activity, Neuromuscular re-education, Balance training, Gait training, Patient/Family education, Self Care, Joint mobilization, Electrical stimulation, Cryotherapy, Moist heat, and Manual therapy.  PLAN FOR NEXT SESSION: continue to push his functional abilities  Sumner Boast, PT 02/11/2022, 2:46 PM

## 2022-02-15 ENCOUNTER — Encounter: Payer: Self-pay | Admitting: Family Medicine

## 2022-02-15 ENCOUNTER — Ambulatory Visit: Payer: Medicare PPO | Admitting: Family Medicine

## 2022-02-15 VITALS — BP 138/76 | HR 67 | Temp 96.9°F | Ht 68.0 in | Wt 201.6 lb

## 2022-02-15 DIAGNOSIS — L309 Dermatitis, unspecified: Secondary | ICD-10-CM

## 2022-02-15 DIAGNOSIS — J310 Chronic rhinitis: Secondary | ICD-10-CM

## 2022-02-15 NOTE — Progress Notes (Signed)
[facility] [address] [place] [address] Dept: [phone] Dept Fax: [phone]  Office Visit  Subjective:    Patient ID: Joshua Ruiz, male    DOB: 1940-05-27, 82 y.o..   MRN: [id number]  Chief Complaint  Patient presents with   Follow-up    4 week f/u. C/o still having sinus drainage and  having swelling on LT side lower back.     History of Present Illness:  Patient is in today for reassessment of a rash he had on his ankle. I saw him about a month ago. He had a rash for about 2 weeks on his left ankle, which was pruritic. He has been putting Betadine on this to help with the itching. This appeared eczematous. I prescribed a steroid cream. He notes the rash has significantly improved.   Joshua Ruiz had also noted an issue with chronic sinus drainage for the past 2 months (since he had back surgery). He has had some associated cough, for which he is using Robitussin. I prescribed ab azelastin nasal spray. He notes this has reduced his drainage, but it remains bothersome.  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   Lumbar spondylosis 10/20/2021   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-L5 level 10/20/2021   Primary osteoarthritis of left knee 08/14/2021   Chronic epididymitis 08/13/2020   Hives 07/11/2020   Rheumatoid arthritis ([place]) 07/11/2020   Osteoarthritis of left knee 07/11/2020   Cervical arthritis 07/11/2020   Gastroesophageal reflux disease 07/11/2020   Presbyopia of both eyes 04/20/2015   Pseudophakia of both eyes 04/20/2015   Past Surgical History:  Procedure Laterality Date   EYE SURGERY Bilateral    cataract extraction   HEMORRHOID SURGERY     LAMINECTOMY WITH POSTERIOR LATERAL ARTHRODESIS LEVEL 3 N/A 11/20/2021    Procedure: LUMBAR TWO THROUGH LUMBAR THREE, LUMBAR THREE THROUGH LUMBAR FOUR, LUMBAR FOUR THROUGH LUMBAR FIVE LAMINECTOMY WITH FACETECTOMY, POSTERIOR SEGMENTAL INSTRUMENTATION, POSTEROLATERAL ARTHRODESIS;  Surgeon: Consuella Lose, MD;  Location: [facility];  Service: Neurosurgery;  Laterality: N/A;  3C   LYMPH NODE BIOPSY     Neck   TRIGGER FINGER RELEASE Right    3rd   Family History  Problem Relation Age of Onset   Rheum arthritis Mother    Diabetes Mother    Cancer Sister        Lung   Cancer Brother        Lung   Outpatient Medications Prior to Visit  Medication Sig Dispense Refill   acetaminophen (TYLENOL) 325 MG tablet Take 650 mg by mouth every 6 (six) hours as needed for moderate pain.     azelastine (ASTELIN) 0.1 % nasal spray Place 1 spray into both nostrils 2 (two) times daily. 30 mL 12   cetirizine (ZYRTEC) 10 MG tablet Take 10 mg by mouth at bedtime.     finasteride (PROSCAR) 5 MG tablet Take 5 mg by mouth daily.     folic acid (FOLVITE) 1 MG tablet Take 1 mg by mouth daily.     hydroxychloroquine (PLAQUENIL) 200 MG tablet Take 200 mg by mouth 2 (two) times daily.     loratadine (CLARITIN) 10 MG tablet Take 10 mg by mouth in the morning.     methocarbamol (ROBAXIN) 500 MG tablet Take 1 tablet (500 mg total) by mouth every  6 (six) hours as needed for muscle spasms. 90 tablet 0   methotrexate (RHEUMATREX) 2.5 MG tablet Take 15 mg by mouth every Saturday. Caution:Chemotherapy. Protect from light.     mometasone (ELOCON) 0.1 % cream Apply topically daily. 15 g 1   Multiple Vitamin (MULTIVITAMIN WITH MINERALS) TABS tablet Take 1 tablet by mouth daily.     Multiple Vitamins-Minerals (PRESERVISION AREDS 2) CAPS Take 1 capsule by mouth in the morning and at bedtime.     naproxen sodium (ALEVE) 220 MG tablet Take 220 mg by mouth daily as needed (pain).     omeprazole (PRILOSEC) 20 MG capsule Take 20 mg by mouth in the morning and at bedtime.     No facility-administered medications  prior to visit.   No Known Allergies    Objective:   Today's Vitals   02/15/22 0944  BP: 138/76  Pulse: 67  Temp: (!) 96.9 F (36.1 C)  TempSrc: Temporal  SpO2: 99%  Weight: 201 lb 9.6 oz (91.4 kg)  Height: 5\' 8"  (1.727 m)   Body mass index is 30.65 kg/m.   General: Well developed, well nourished. No acute distress. Skin: Warm and dry. There is no longer any scaliness to the left inner ankle. There are multiple spider varicosities. Psych: Alert and oriented. Normal mood and affect.  Health Maintenance Due  Topic Date Due   DTaP/Tdap/Td (1 - Tdap) Never done   Zoster Vaccines- Shingrix (1 of 2) Never done     Assessment & Plan:   Problem List Items Addressed This Visit       Respiratory   Chronic rhinitis - Primary    I will refer him for an assessment with ENT. He will continue the azelastine spray for now.        Musculoskeletal and Integument   Eczema    Resolved. I recommend he retain the Elocon cream for use if the symptoms return.       Return in about 6 months (around 08/16/2022) for Reassessment.   Haydee Salter, MD

## 2022-02-15 NOTE — Assessment & Plan Note (Signed)
Resolved. I recommend he retain the Elocon cream for use if the symptoms return.

## 2022-02-15 NOTE — Assessment & Plan Note (Signed)
I will refer him for an assessment with ENT. He will continue the azelastine spray for now.

## 2022-02-18 ENCOUNTER — Ambulatory Visit: Payer: Medicare PPO | Admitting: Physical Therapy

## 2022-02-18 DIAGNOSIS — M5459 Other low back pain: Secondary | ICD-10-CM

## 2022-02-18 DIAGNOSIS — R293 Abnormal posture: Secondary | ICD-10-CM | POA: Diagnosis not present

## 2022-02-18 DIAGNOSIS — M6281 Muscle weakness (generalized): Secondary | ICD-10-CM

## 2022-02-18 DIAGNOSIS — R262 Difficulty in walking, not elsewhere classified: Secondary | ICD-10-CM | POA: Diagnosis not present

## 2022-02-18 NOTE — Therapy (Signed)
OUTPATIENT PHYSICAL THERAPY THORACOLUMBAR TREATMENT   Patient Name: Joshua Ruiz MRN: [id number] DOB:1940/09/30, 82 y.o., male Today's Date: 02/18/2022  END OF SESSION:  PT End of Session - 02/18/22 1537     Visit Number 9    Date for PT Re-Evaluation 02/28/22    Authorization Type [employer]    PT Start Time 1530    PT Stop Time 1615    PT Time Calculation (min) 45 min             Past Medical History:  Diagnosis Date   GERD (gastroesophageal reflux disease)    Rheumatoid arthritis ([facility])    Past Surgical History:  Procedure Laterality Date   EYE SURGERY Bilateral    cataract extraction   HEMORRHOID SURGERY     LAMINECTOMY WITH POSTERIOR LATERAL ARTHRODESIS LEVEL 3 N/A 11/20/2021   Procedure: LUMBAR TWO THROUGH LUMBAR THREE, LUMBAR THREE THROUGH LUMBAR FOUR, LUMBAR FOUR THROUGH LUMBAR FIVE LAMINECTOMY WITH FACETECTOMY, POSTERIOR SEGMENTAL INSTRUMENTATION, POSTEROLATERAL ARTHRODESIS;  Surgeon: Lisbeth Renshaw, MD;  Location: [facility];  Service: Neurosurgery;  Laterality: N/A;  3C   LYMPH NODE BIOPSY     Neck   TRIGGER FINGER RELEASE Right    3rd   Patient Active Problem List   Diagnosis Date Noted   Chronic rhinitis 02/15/2022   Eczema 02/15/2022   Spondylolisthesis of lumbar region 11/20/2021   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/2021   Lumbar spondylosis 10/20/2021   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-L5 level 10/20/2021   Primary osteoarthritis of left knee 08/14/2021   Chronic epididymitis 08/13/2020   Hives 07/11/2020   Rheumatoid arthritis ([facility]) 07/11/2020   Osteoarthritis of left knee 07/11/2020   Cervical arthritis 07/11/2020   Gastroesophageal reflux disease 07/11/2020   Presbyopia of both eyes 04/20/2015   Pseudophakia of both eyes 04/20/2015    PCP: Veto Kemps  REFERRING PROVIDER: Conchita Paris  REFERRING DIAG: s/p lumbar fusion L2-5  Rationale for Evaluation and Treatment: Rehabilitation  THERAPY DIAG:  Other low back pain  Muscle weakness  (generalized)  Difficulty in walking, not elsewhere classified  ONSET DATE: 11/20/21  SUBJECTIVE:                                                                                                                                                                                           SUBJECTIVE STATEMENT: Reports doing fairly well  PERTINENT HISTORY:  See above  PAIN:  Are you having pain? Yes: NPRS scale: 0/10 Pain location: low back Pain description: tender, tight, ache Aggravating factors: touching it, standing, shopping, gets tired "very quick" 5 minutes standing, pain up to 6/10 Relieving factors: sit rest  PRECAUTIONS: Back  WEIGHT BEARING RESTRICTIONS: No  FALLS:  Has patient fallen in last 6 months? No  LIVING ENVIRONMENT: Lives with: lives alone Lives in: House/apartment Stairs: No Has following equipment at home: None  OCCUPATION: retired  PLOF: Independent does own housework some gym a few days a week  PATIENT GOALS: feel better, better motions, less difficulty standing, pick up things  NEXT MD VISIT:  January 3  OBJECTIVE:   DIAGNOSTIC FINDINGS:  healing  PATIENT SURVEYS:  FOTO 33  SCREENING FOR RED FLAGS: Bowel or bladder incontinence: No Spinal tumors: No Cauda equina syndrome: No Compression fracture: No Abdominal aneurysm: No  COGNITION: Overall cognitive status: Within functional limits for tasks assessed     SENSATION: Mild numbness in the left thigh  MUSCLE LENGTH: Hamstrings: Right 50 deg; Left 40 deg  POSTURE: decreased lumbar lordosis  PALPATION: Very tight and tender in the lumbar paraspinals and the mild tenderness in the buttocks  LUMBAR ROM: Following BLT rules he is very stiff especially with lateral motions   LOWER EXTREMITY ROM:   other motions guarded but WFL's  Active  Right eval Left eval  Hip flexion    Hip extension    Hip abduction    Hip adduction    Hip internal rotation    Hip external rotation     Knee flexion    Knee extension  -15  Ankle dorsiflexion    Ankle plantarflexion    Ankle inversion    Ankle eversion     (Blank rows = not tested)  LOWER EXTREMITY MMT:    MMT Right eval Left eval  Hip flexion 4 4-  Hip extension    Hip abduction 4- 4-  Hip adduction    Hip internal rotation    Hip external rotation    Knee flexion 4- 4-  Knee extension 4- 4-  Ankle dorsiflexion 4 4  Ankle plantarflexion    Ankle inversion    Ankle eversion     (Blank rows = not tested)  FUNCTIONAL TESTS:  5 times sit to stand: 20 seconds Timed up and go (TUG): 17 3 minute walk test: stopped at 2 minutes 360 feet  GAIT: Distance walked: 100 feet Assistive device utilized: None Level of assistance: Complete Independence Comments: first few steps the left leg really gave way and he stumbled has trendelenberg on the left, on stairs he did one step at a time  TODAY'S TREATMENT:                                                                                                                              DATE:    02/18/22 Amb outside around entire parking lot front and back 7 min and 45 sec with 1 standing rest coming up hill PRE 4/10 Seated row and Lat Pull 35# 2 sets 10 Black Tband trunk flex and ext 2 sets 10 15# chest press 2x10 Leg press 30# 2 sets 10,calf raises 30# 2  sets 10 Feet on ball K2C, trunk rotation, small posterior activation, isometric abs Passive stretch of the LE's    02/11/22 Nustep level 5 x 5 minutes Walk outside around the back building 2 rest breaks 35# lats 2x10 35# rows 2x10 15# chest press 2x10 10# straight arm pulls 10# AR press Feet on ball K2C, trunk rotation, small posterior activation, isometric abs Passive stretch of the LE's  02/09/22 Nustep level 5 x 5 minutes Walk outside lap around the building in the back 3 standing rest breaks Seated rows 35# LAts 35# 2x10 10# straight arm pulls cues for core activation 15# AR press 15# chest press  2x10 Passive LE stretches  02/04/22 Nustep level 5 x 6 mintues Bike level 4 x 6 minutes 10# Straight arm pulls 2x10 15# AR press 25# HS curls 3x10 5# leg extension 2x10 20# rows 2x10 20# lats Feet on ball K2C, trunk rotation, posterior activation, isometric abs Passive stretch of the Hs and the piriformis 4 minute walk 6 laps = 700 feet  01/28/22 Nustep levl 5 x 6 minutes Bike LEvel 4 x 5 minutes Seated Row 25#  Lats 25# Chest press 10# Leg press 20# 3x10 LEg extension 5# 3x10 LEg curls 25# 3x10 10# straight arm pulls Passive HS and piriformis stretch Feet on ball K2C   01/21/22 3 minute walk test 600 feet, sore and tired with some SOB UBE level 3 x 4 minutes Bike LEvel 4 x 5 minutes 25# lats and Rows 2x10 each 5# chest press 2x10 20# leg press 2 x10 Leg curls 20# 2x10 LEg extension 5# 2x10 Feet on ball K2C, trunk rotation, posterior activation and isometric abs Passive HS and piriformis stretch  PATIENT EDUCATION:  Education details: POC Person educated: Patient Education method: Explanation Education comprehension: verbalized understanding  HOME EXERCISE PROGRAM: Reviewed HEP from the hospital  ASSESSMENT:  CLINICAL IMPRESSION: Improved walk today, increased distance with only 1 standing rest break.Continue to progress strengthening.LE's are tight, does need cues for the posture during exercises. Goals assessed and documented  OBJECTIVE IMPAIRMENTS: Abnormal gait, cardiopulmonary status limiting activity, decreased activity tolerance, decreased balance, decreased endurance, decreased mobility, difficulty walking, decreased ROM, decreased strength, increased muscle spasms, impaired flexibility, improper body mechanics, postural dysfunction, and pain.   REHAB POTENTIAL: Good  CLINICAL DECISION MAKING: Evolving/moderate complexity  EVALUATION COMPLEXITY: Low   GOALS: Goals reviewed with patient? Yes  SHORT TERM GOALS: Target date: 01/10/22  Independent  with initial HEP Goal status: met  LONG TERM GOALS: Target date: 03/31/22  Understand posture and body mechanics  Goal status: ongoing  2.  Decrease pain 50% Goal status: progressing 02/11/22, MET 02/18/22 80-90% better  3.  Increase lumbar ROM 25% Goal status: ongoing 02/11/22 and 02/18/22  4.  Walk 1000 feet  Goal status: progressing 02/18/22 with standing rest break  5.  Be able to put on shoes and socks without difficulty Goal status: progressing 02/18/22   PLAN:  PT FREQUENCY: 1-2x/week  PT DURATION: 12 weeks  PLANNED INTERVENTIONS: Therapeutic exercises, Therapeutic activity, Neuromuscular re-education, Balance training, Gait training, Patient/Family education, Self Care, Joint mobilization, Electrical stimulation, Cryotherapy, Moist heat, and Manual therapy.  PLAN FOR NEXT SESSION: continue to push his functional abilities  Nevaeh Casillas,ANGIE, PTA 02/18/2022, 3:38 PM [place] [facility] [place]. [place], [place], [address] Phone: [phone]   Fax:  [phone]  Patient Details  Name: Joshua Ruiz MRN: [id number] Date of Birth: January 03, 1941 Referring Provider:  Haydee Salter,  MD  Encounter Date: 02/18/2022   Laqueta Carina, PTA 02/18/2022, 3:38 PM  [facility] [place] [place]. [place], [place], [address] Phone: [phone]   Fax:  [phone]

## 2022-02-23 ENCOUNTER — Ambulatory Visit: Payer: Medicare PPO | Admitting: Physical Therapy

## 2022-02-23 ENCOUNTER — Encounter: Payer: Self-pay | Admitting: Physical Therapy

## 2022-02-23 DIAGNOSIS — M6281 Muscle weakness (generalized): Secondary | ICD-10-CM | POA: Diagnosis not present

## 2022-02-23 DIAGNOSIS — M5459 Other low back pain: Secondary | ICD-10-CM

## 2022-02-23 DIAGNOSIS — R262 Difficulty in walking, not elsewhere classified: Secondary | ICD-10-CM | POA: Diagnosis not present

## 2022-02-23 DIAGNOSIS — R293 Abnormal posture: Secondary | ICD-10-CM | POA: Diagnosis not present

## 2022-02-23 NOTE — Therapy (Signed)
OUTPATIENT PHYSICAL THERAPY THORACOLUMBAR TREATMENT Progress Note Reporting Period 12/30/21 to 02/23/22  See note below for Objective Data and Assessment of Progress/Goals.      Patient Name: Joshua Ruiz MRN: [id number] DOB:1940/07/20, 82 y.o., male Today's Date: 02/23/2022  END OF SESSION:  PT End of Session - 02/23/22 1740     Visit Number 10    Date for PT Re-Evaluation 02/28/22    Authorization Type [employer]    Authorization Time Period 8/12    PT Start Time 1740    PT Stop Time 1825    PT Time Calculation (min) 45 min    Activity Tolerance Patient tolerated treatment well    Behavior During Therapy WFL for tasks assessed/performed             Past Medical History:  Diagnosis Date   GERD (gastroesophageal reflux disease)    Rheumatoid arthritis ([place])    Past Surgical History:  Procedure Laterality Date   EYE SURGERY Bilateral    cataract extraction   HEMORRHOID SURGERY     LAMINECTOMY WITH POSTERIOR LATERAL ARTHRODESIS LEVEL 3 N/A 11/20/2021   Procedure: LUMBAR TWO THROUGH LUMBAR THREE, LUMBAR THREE THROUGH LUMBAR FOUR, LUMBAR FOUR THROUGH LUMBAR FIVE LAMINECTOMY WITH FACETECTOMY, POSTERIOR SEGMENTAL INSTRUMENTATION, POSTEROLATERAL ARTHRODESIS;  Surgeon: Consuella Lose, MD;  Location: [facility];  Service: Neurosurgery;  Laterality: N/A;  3C   LYMPH NODE BIOPSY     Neck   TRIGGER FINGER RELEASE Right    3rd   Patient Active Problem List   Diagnosis Date Noted   Chronic rhinitis 02/15/2022   Eczema 02/15/2022   Spondylolisthesis of lumbar region 11/20/2021   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/2021   Lumbar spondylosis 10/20/2021   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-L5 level 10/20/2021   Primary osteoarthritis of left knee 08/14/2021   Chronic epididymitis 08/13/2020   Hives 07/11/2020   Rheumatoid arthritis ([place]) 07/11/2020   Osteoarthritis of left knee 07/11/2020   Cervical arthritis 07/11/2020   Gastroesophageal reflux disease 07/11/2020    Presbyopia of both eyes 04/20/2015   Pseudophakia of both eyes 04/20/2015    PCP: Gena Fray  REFERRING PROVIDER: Kathyrn Sheriff  REFERRING DIAG: s/p lumbar fusion L2-5  Rationale for Evaluation and Treatment: Rehabilitation  THERAPY DIAG:  Other low back pain  Muscle weakness (generalized)  Difficulty in walking, not elsewhere classified  Abnormal posture  ONSET DATE: 11/20/21  SUBJECTIVE:                                                                                                                                                                                           SUBJECTIVE STATEMENT: I am doing a little  better, less fatigue  PERTINENT HISTORY:  See above  PAIN:  Are you having pain? Yes: NPRS scale: 0/10 Pain location: low back Pain description: tender, tight, ache Aggravating factors: touching it, standing, shopping, gets tired "very quick" 5 minutes standing, pain up to 6/10 Relieving factors: sit rest  PRECAUTIONS: Back  WEIGHT BEARING RESTRICTIONS: No  FALLS:  Has patient fallen in last 6 months? No  LIVING ENVIRONMENT: Lives with: lives alone Lives in: House/apartment Stairs: No Has following equipment at home: None  OCCUPATION: retired  PLOF: Independent does own housework some gym a few days a week  PATIENT GOALS: feel better, better motions, less difficulty standing, pick up things  NEXT MD VISIT:  January 3  OBJECTIVE:   DIAGNOSTIC FINDINGS:  healing  PATIENT SURVEYS:  FOTO 33  SCREENING FOR RED FLAGS: Bowel or bladder incontinence: No Spinal tumors: No Cauda equina syndrome: No Compression fracture: No Abdominal aneurysm: No  COGNITION: Overall cognitive status: Within functional limits for tasks assessed     SENSATION: Mild numbness in the left thigh  MUSCLE LENGTH: Hamstrings: Right 50 deg; Left 40 deg  POSTURE: decreased lumbar lordosis  PALPATION: Very tight and tender in the lumbar paraspinals and the mild tenderness  in the buttocks  LUMBAR ROM: Following BLT rules he is very stiff especially with lateral motions   LOWER EXTREMITY ROM:   other motions guarded but WFL's  Active  Right eval Left eval  Hip flexion    Hip extension    Hip abduction    Hip adduction    Hip internal rotation    Hip external rotation    Knee flexion    Knee extension  -15  Ankle dorsiflexion    Ankle plantarflexion    Ankle inversion    Ankle eversion     (Blank rows = not tested)  LOWER EXTREMITY MMT:    MMT Right eval Left eval  Hip flexion 4 4-  Hip extension    Hip abduction 4- 4-  Hip adduction    Hip internal rotation    Hip external rotation    Knee flexion 4- 4-  Knee extension 4- 4-  Ankle dorsiflexion 4 4  Ankle plantarflexion    Ankle inversion    Ankle eversion     (Blank rows = not tested)  FUNCTIONAL TESTS:  5 times sit to stand: 20 seconds Timed up and go (TUG): 17 3 minute walk test: stopped at 2 minutes 360 feet  GAIT: Distance walked: 100 feet Assistive device utilized: None Level of assistance: Complete Independence Comments: first few steps the left leg really gave way and he stumbled has trendelenberg on the left, on stairs he did one step at a time  TODAY'S TREATMENT:                                                                                                                              DATE:   02/23/22 Nustep  level 5 x 7 minutes Leg press 40# 2x10 Lats 35# Seated row 35# Black tband seated extension Straight arm pulls with cues for posture and form  25# triceps 5# biceps 10# AR press Feet on ball K2C. Trunk rotation, posterior isometric and abdominal isometrics PROM HS ands piriformis  02/18/22 Amb outside around entire parking lot front and back 7 min and 45 sec with 1 standing rest coming up hill PRE 4/10 Seated row and Lat Pull 35# 2 sets 10 Black Tband trunk flex and ext 2 sets 10 15# chest press 2x10 Leg press 30# 2 sets 10,calf raises 30# 2 sets  10 Feet on ball K2C, trunk rotation, small posterior activation, isometric abs Passive stretch of the LE's  02/11/22 Nustep level 5 x 5 minutes Walk outside around the back building 2 rest breaks 35# lats 2x10 35# rows 2x10 15# chest press 2x10 10# straight arm pulls 10# AR press Feet on ball K2C, trunk rotation, small posterior activation, isometric abs Passive stretch of the LE's  02/09/22 Nustep level 5 x 5 minutes Walk outside lap around the building in the back 3 standing rest breaks Seated rows 35# LAts 35# 2x10 10# straight arm pulls cues for core activation 15# AR press 15# chest press 2x10 Passive LE stretches  02/04/22 Nustep level 5 x 6 mintues Bike level 4 x 6 minutes 10# Straight arm pulls 2x10 15# AR press 25# HS curls 3x10 5# leg extension 2x10 20# rows 2x10 20# lats Feet on ball K2C, trunk rotation, posterior activation, isometric abs Passive stretch of the Hs and the piriformis 4 minute walk 6 laps = 700 feet  01/28/22 Nustep levl 5 x 6 minutes Bike LEvel 4 x 5 minutes Seated Row 25#  Lats 25# Chest press 10# Leg press 20# 3x10 LEg extension 5# 3x10 LEg curls 25# 3x10 10# straight arm pulls Passive HS and piriformis stretch Feet on ball K2C   01/21/22 3 minute walk test 600 feet, sore and tired with some SOB UBE level 3 x 4 minutes Bike LEvel 4 x 5 minutes 25# lats and Rows 2x10 each 5# chest press 2x10 20# leg press 2 x10 Leg curls 20# 2x10 LEg extension 5# 2x10 Feet on ball K2C, trunk rotation, posterior activation and isometric abs Passive HS and piriformis stretch  PATIENT EDUCATION:  Education details: POC Person educated: Patient Education method: Explanation Education comprehension: verbalized understanding  HOME EXERCISE PROGRAM: Reviewed HEP from the hospital  ASSESSMENT:  CLINICAL IMPRESSION: Patient is reporting that he is moving and doing more and better at home with minimal pain.  I knew him prior to this surgery and  he is still very slow with his motions and very guarded with transfers from supine to sit and sit to supine.  His endurance is much improved but still limited, still reports discomfort with any standing  OBJECTIVE IMPAIRMENTS: Abnormal gait, cardiopulmonary status limiting activity, decreased activity tolerance, decreased balance, decreased endurance, decreased mobility, difficulty walking, decreased ROM, decreased strength, increased muscle spasms, impaired flexibility, improper body mechanics, postural dysfunction, and pain.   REHAB POTENTIAL: Good  CLINICAL DECISION MAKING: Evolving/moderate complexity  EVALUATION COMPLEXITY: Low   GOALS: Goals reviewed with patient? Yes  SHORT TERM GOALS: Target date: 01/10/22  Independent with initial HEP Goal status: met  LONG TERM GOALS: Target date: 03/31/22  Understand posture and body mechanics  Goal status: ongoing 02/23/22  2.  Decrease pain 50% Goal status: progressing 02/11/22, MET 02/18/22 80-90% better  3.  Increase lumbar  ROM 25% Goal status: ongoing 02/11/22 and 02/18/22  4.  Walk 1000 feet  Goal status: progressing 02/18/22 with standing rest break  5.  Be able to put on shoes and socks without difficulty Goal status: progressing 02/23/22   PLAN:  PT FREQUENCY: 1-2x/week  PT DURATION: 12 weeks  PLANNED INTERVENTIONS: Therapeutic exercises, Therapeutic activity, Neuromuscular re-education, Balance training, Gait training, Patient/Family education, Self Care, Joint mobilization, Electrical stimulation, Cryotherapy, Moist heat, and Manual therapy.  PLAN FOR NEXT SESSION: NExt visit will need to ask for more visits from [facility], PT 02/23/2022, 5:45 PM [facility] at [place]. [place], [place], [address] Phone: [phone]   Fax:  [phone]

## 2022-02-25 ENCOUNTER — Ambulatory Visit: Payer: Medicare PPO | Admitting: Physical Therapy

## 2022-02-25 DIAGNOSIS — M6281 Muscle weakness (generalized): Secondary | ICD-10-CM | POA: Diagnosis not present

## 2022-02-25 DIAGNOSIS — R293 Abnormal posture: Secondary | ICD-10-CM | POA: Diagnosis not present

## 2022-02-25 DIAGNOSIS — M5459 Other low back pain: Secondary | ICD-10-CM | POA: Diagnosis not present

## 2022-02-25 DIAGNOSIS — R262 Difficulty in walking, not elsewhere classified: Secondary | ICD-10-CM

## 2022-02-25 NOTE — Therapy (Signed)
OUTPATIENT PHYSICAL THERAPY THORACOLUMBAR TREATMENT Patient Name: Joshua Ruiz MRN: [id number] DOB:Jul 17, 1940, 82 y.o., male Today's Date: 02/25/2022  END OF SESSION:  PT End of Session - 02/25/22 1146     Visit Number 11    Date for PT Re-Evaluation 02/28/22    Authorization Type [employer]    PT Start Time 1145    PT Stop Time 1230    PT Time Calculation (min) 45 min             Past Medical History:  Diagnosis Date   GERD (gastroesophageal reflux disease)    Rheumatoid arthritis ([place])    Past Surgical History:  Procedure Laterality Date   EYE SURGERY Bilateral    cataract extraction   HEMORRHOID SURGERY     LAMINECTOMY WITH POSTERIOR LATERAL ARTHRODESIS LEVEL 3 N/A 11/20/2021   Procedure: LUMBAR TWO THROUGH LUMBAR THREE, LUMBAR THREE THROUGH LUMBAR FOUR, LUMBAR FOUR THROUGH LUMBAR FIVE LAMINECTOMY WITH FACETECTOMY, POSTERIOR SEGMENTAL INSTRUMENTATION, POSTEROLATERAL ARTHRODESIS;  Surgeon: Consuella Lose, MD;  Location: [facility];  Service: Neurosurgery;  Laterality: N/A;  3C   LYMPH NODE BIOPSY     Neck   TRIGGER FINGER RELEASE Right    3rd   Patient Active Problem List   Diagnosis Date Noted   Chronic rhinitis 02/15/2022   Eczema 02/15/2022   Spondylolisthesis of lumbar region 11/20/2021   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/2021   Lumbar spondylosis 10/20/2021   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-L5 level 10/20/2021   Primary osteoarthritis of left knee 08/14/2021   Chronic epididymitis 08/13/2020   Hives 07/11/2020   Rheumatoid arthritis ([place]) 07/11/2020   Osteoarthritis of left knee 07/11/2020   Cervical arthritis 07/11/2020   Gastroesophageal reflux disease 07/11/2020   Presbyopia of both eyes 04/20/2015   Pseudophakia of both eyes 04/20/2015    PCP: Gena Fray  REFERRING PROVIDER: Kathyrn Sheriff  REFERRING DIAG: s/p lumbar fusion L2-5  Rationale for Evaluation and Treatment: Rehabilitation  THERAPY DIAG:  Other low back pain  Muscle weakness  (generalized)  Difficulty in walking, not elsewhere classified  Abnormal posture  ONSET DATE: 11/20/21  SUBJECTIVE:                                                                                                                                                                                           SUBJECTIVE STATEMENT: I am doing a little better, less fatigue. Pt amb in without back brace ( per pt tying to use less) guarded gait through trunk. Tight per pt report  PERTINENT HISTORY:  See above  PAIN:  Are you having pain? Yes: NPRS scale: 0/10 Pain location: low back Pain description: tender, tight,  ache Aggravating factors: touching it, standing, shopping, gets tired "very quick" 5 minutes standing, pain up to 6/10 Relieving factors: sit rest  PRECAUTIONS: Back  WEIGHT BEARING RESTRICTIONS: No  FALLS:  Has patient fallen in last 6 months? No  LIVING ENVIRONMENT: Lives with: lives alone Lives in: House/apartment Stairs: No Has following equipment at home: None  OCCUPATION: retired  PLOF: Independent does own housework some gym a few days a week  PATIENT GOALS: feel better, better motions, less difficulty standing, pick up things  NEXT MD VISIT:  January 3  OBJECTIVE:   DIAGNOSTIC FINDINGS:  healing  PATIENT SURVEYS:  FOTO 33  SCREENING FOR RED FLAGS: Bowel or bladder incontinence: No Spinal tumors: No Cauda equina syndrome: No Compression fracture: No Abdominal aneurysm: No  COGNITION: Overall cognitive status: Within functional limits for tasks assessed     SENSATION: Mild numbness in the left thigh  MUSCLE LENGTH: Hamstrings: Right 50 deg; Left 40 deg  POSTURE: decreased lumbar lordosis  PALPATION: Very tight and tender in the lumbar paraspinals and the mild tenderness in the buttocks  LUMBAR ROM: Following BLT rules he is very stiff especially with lateral motions   LOWER EXTREMITY ROM:   other motions guarded but WFL's  Active   Right eval Left eval  Hip flexion    Hip extension    Hip abduction    Hip adduction    Hip internal rotation    Hip external rotation    Knee flexion    Knee extension  -15  Ankle dorsiflexion    Ankle plantarflexion    Ankle inversion    Ankle eversion     (Blank rows = not tested)  LOWER EXTREMITY MMT:    MMT Right eval Left eval  Hip flexion 4 4-  Hip extension    Hip abduction 4- 4-  Hip adduction    Hip internal rotation    Hip external rotation    Knee flexion 4- 4-  Knee extension 4- 4-  Ankle dorsiflexion 4 4  Ankle plantarflexion    Ankle inversion    Ankle eversion     (Blank rows = not tested)  FUNCTIONAL TESTS:  5 times sit to stand: 20 seconds Timed up and go (TUG): 17 3 minute walk test: stopped at 2 minutes 360 feet  GAIT: Distance walked: 100 feet Assistive device utilized: None Level of assistance: Complete Independence Comments: first few steps the left leg really gave way and he stumbled has trendelenberg on the left, on stairs he did one step at a time  TODAY'S TREATMENT:                                                                                                                              DATE:    02/25/22 Nustep L 6 6 min Leg Press 40# 2 sets 10 Calf Raises 40# 2 sets 10 Lats 35# 2 sets15 Seated row 35#  2 sets 15  Black tband seated extension 20 x Cable pulley reciprocal punches and shdl ext Cable pulley trunk rotation 10# 12 x each 25# triceps 2 sets 12 5# biceps 2 sets 12 Feet on ball K2C. Trunk rotation, posterior isometric and abdominal isometrics PROM LE and trunk     02/23/22 Nustep level 5 x 7 minutes Leg press 40# 2x10 Lats 35# Seated row 35# Black tband seated extension Straight arm pulls with cues for posture and form  25# triceps 5# biceps 10# AR press Feet on ball K2C. Trunk rotation, posterior isometric and abdominal isometrics PROM HS ands piriformis  02/18/22 Amb outside around entire parking lot  front and back 7 min and 45 sec with 1 standing rest coming up hill PRE 4/10 Seated row and Lat Pull 35# 2 sets 10 Black Tband trunk flex and ext 2 sets 10 15# chest press 2x10 Leg press 30# 2 sets 10,calf raises 30# 2 sets 10 Feet on ball K2C, trunk rotation, small posterior activation, isometric abs Passive stretch of the LE's  02/11/22 Nustep level 5 x 5 minutes Walk outside around the back building 2 rest breaks 35# lats 2x10 35# rows 2x10 15# chest press 2x10 10# straight arm pulls 10# AR press Feet on ball K2C, trunk rotation, small posterior activation, isometric abs Passive stretch of the LE's  02/09/22 Nustep level 5 x 5 minutes Walk outside lap around the building in the back 3 standing rest breaks Seated rows 35# LAts 35# 2x10 10# straight arm pulls cues for core activation 15# AR press 15# chest press 2x10 Passive LE stretches  02/04/22 Nustep level 5 x 6 mintues Bike level 4 x 6 minutes 10# Straight arm pulls 2x10 15# AR press 25# HS curls 3x10 5# leg extension 2x10 20# rows 2x10 20# lats Feet on ball K2C, trunk rotation, posterior activation, isometric abs Passive stretch of the Hs and the piriformis 4 minute walk 6 laps = 700 feet  01/28/22 Nustep levl 5 x 6 minutes Bike LEvel 4 x 5 minutes Seated Row 25#  Lats 25# Chest press 10# Leg press 20# 3x10 LEg extension 5# 3x10 LEg curls 25# 3x10 10# straight arm pulls Passive HS and piriformis stretch Feet on ball K2C   01/21/22 3 minute walk test 600 feet, sore and tired with some SOB UBE level 3 x 4 minutes Bike LEvel 4 x 5 minutes 25# lats and Rows 2x10 each 5# chest press 2x10 20# leg press 2 x10 Leg curls 20# 2x10 LEg extension 5# 2x10 Feet on ball K2C, trunk rotation, posterior activation and isometric abs Passive HS and piriformis stretch  PATIENT EDUCATION:  Education details: POC Person educated: Patient Education method: Explanation Education comprehension: verbalized  understanding  HOME EXERCISE PROGRAM: Reviewed HEP from the hospital  ASSESSMENT:  CLINICAL IMPRESSION: Patient is reporting that he is moving and doing more and better at home with minimal pain.  I knew him prior to this surgery and he is still very slow with his motions and very guarded with transfers from supine to sit and sit to supine.  His endurance is much improved but still limited, still reports discomfort with any standing. Progressing strength and ROM  OBJECTIVE IMPAIRMENTS: Abnormal gait, cardiopulmonary status limiting activity, decreased activity tolerance, decreased balance, decreased endurance, decreased mobility, difficulty walking, decreased ROM, decreased strength, increased muscle spasms, impaired flexibility, improper body mechanics, postural dysfunction, and pain.   REHAB POTENTIAL: Good  CLINICAL DECISION MAKING: Evolving/moderate complexity  EVALUATION COMPLEXITY: Low   GOALS: Goals  reviewed with patient? Yes  SHORT TERM GOALS: Target date: 01/10/22  Independent with initial HEP Goal status: met  LONG TERM GOALS: Target date: 03/31/22  Understand posture and body mechanics  Goal status: ongoing 02/23/22  2.  Decrease pain 50% Goal status: progressing 02/11/22, MET 02/18/22 80-90% better  3.  Increase lumbar ROM 25% Goal status: ongoing 02/11/22 and 02/18/22  4.  Walk 1000 feet  Goal status: progressing 02/18/22 with standing rest break  5.  Be able to put on shoes and socks without difficulty Goal status: progressing 02/23/22   PLAN:  PT FREQUENCY: 1-2x/week  PT DURATION: 12 weeks  PLANNED INTERVENTIONS: Therapeutic exercises, Therapeutic activity, Neuromuscular re-education, Balance training, Gait training, Patient/Family education, Self Care, Joint mobilization, Electrical stimulation, Cryotherapy, Moist heat, and Manual therapy.  PLAN FOR NEXT SESSION:  [employer] request sent  Laqueta Carina, PTA 02/25/2022, 11:46 AM [facility] at [place]. [place], [place], [address] Phone: [phone]   Fax:  [facility] at [place]. [place], [place], [address] Phone: [phone]   Fax:  [phone]  Patient Details  Name: Joshua Ruiz MRN: [id number] Date of Birth: Nov 11, 1940 Referring Provider:  Haydee Salter, MD  Encounter Date: 02/25/2022   Laqueta Carina, PTA 02/25/2022, 11:46 AM  [facility] at [place]. [place], [place], [address] Phone: [phone]   Fax:  [phone]

## 2022-03-05 ENCOUNTER — Ambulatory Visit: Payer: Medicare PPO | Admitting: Physical Therapy

## 2022-03-05 DIAGNOSIS — R262 Difficulty in walking, not elsewhere classified: Secondary | ICD-10-CM | POA: Diagnosis not present

## 2022-03-05 DIAGNOSIS — R293 Abnormal posture: Secondary | ICD-10-CM | POA: Diagnosis not present

## 2022-03-05 DIAGNOSIS — M5459 Other low back pain: Secondary | ICD-10-CM

## 2022-03-05 DIAGNOSIS — M6281 Muscle weakness (generalized): Secondary | ICD-10-CM | POA: Diagnosis not present

## 2022-03-05 NOTE — Therapy (Signed)
OUTPATIENT PHYSICAL THERAPY THORACOLUMBAR TREATMENT Patient Name: Joshua Ruiz MRN: [id number] DOB:1940/03/01, 82 y.o., male Today's Date: 03/05/2022  END OF SESSION:  PT End of Session - 03/05/22 1039     Visit Number 12    Date for PT Re-Evaluation 02/28/22    Authorization Type [employer]    PT Start Time 1040    PT Stop Time 1130    PT Time Calculation (min) 50 min             Past Medical History:  Diagnosis Date   GERD (gastroesophageal reflux disease)    Rheumatoid arthritis ([place])    Past Surgical History:  Procedure Laterality Date   EYE SURGERY Bilateral    cataract extraction   HEMORRHOID SURGERY     LAMINECTOMY WITH POSTERIOR LATERAL ARTHRODESIS LEVEL 3 N/A 11/20/2021   Procedure: LUMBAR TWO THROUGH LUMBAR THREE, LUMBAR THREE THROUGH LUMBAR FOUR, LUMBAR FOUR THROUGH LUMBAR FIVE LAMINECTOMY WITH FACETECTOMY, POSTERIOR SEGMENTAL INSTRUMENTATION, POSTEROLATERAL ARTHRODESIS;  Surgeon: Consuella Lose, MD;  Location: [facility];  Service: Neurosurgery;  Laterality: N/A;  3C   LYMPH NODE BIOPSY     Neck   TRIGGER FINGER RELEASE Right    3rd   Patient Active Problem List   Diagnosis Date Noted   Chronic rhinitis 02/15/2022   Eczema 02/15/2022   Spondylolisthesis of lumbar region 11/20/2021   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/2021   Lumbar spondylosis 10/20/2021   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-L5 level 10/20/2021   Primary osteoarthritis of left knee 08/14/2021   Chronic epididymitis 08/13/2020   Hives 07/11/2020   Rheumatoid arthritis ([place]) 07/11/2020   Osteoarthritis of left knee 07/11/2020   Cervical arthritis 07/11/2020   Gastroesophageal reflux disease 07/11/2020   Presbyopia of both eyes 04/20/2015   Pseudophakia of both eyes 04/20/2015    PCP: Gena Fray  REFERRING PROVIDER: Kathyrn Sheriff  REFERRING DIAG: s/p lumbar fusion L2-5  Rationale for Evaluation and Treatment: Rehabilitation  THERAPY DIAG:  Other low back pain  Muscle weakness  (generalized)  Difficulty in walking, not elsewhere classified  ONSET DATE: 11/20/21  SUBJECTIVE:                                                                                                                                                                                           SUBJECTIVE STATEMENT: I am doing pretty well. PERTINENT HISTORY:  See above  PAIN:  Are you having pain? Yes: NPRS scale: 0/10 Pain location: low back Pain description: tender, tight, ache Aggravating factors: touching it, standing, shopping, gets tired "very quick" 5 minutes standing, pain up to 6/10 Relieving factors: sit rest  PRECAUTIONS: Back  WEIGHT BEARING  RESTRICTIONS: No  FALLS:  Has patient fallen in last 6 months? No  LIVING ENVIRONMENT: Lives with: lives alone Lives in: House/apartment Stairs: No Has following equipment at home: None  OCCUPATION: retired  PLOF: Independent does own housework some gym a few days a week  PATIENT GOALS: feel better, better motions, less difficulty standing, pick up things  NEXT MD VISIT:  January 3  OBJECTIVE:   DIAGNOSTIC FINDINGS:  healing  PATIENT SURVEYS:  FOTO 33  SCREENING FOR RED FLAGS: Bowel or bladder incontinence: No Spinal tumors: No Cauda equina syndrome: No Compression fracture: No Abdominal aneurysm: No  COGNITION: Overall cognitive status: Within functional limits for tasks assessed     SENSATION: Mild numbness in the left thigh  MUSCLE LENGTH: Hamstrings: Right 50 deg; Left 40 deg  POSTURE: decreased lumbar lordosis  PALPATION: Very tight and tender in the lumbar paraspinals and the mild tenderness in the buttocks  LUMBAR ROM: Following BLT rules he is very stiff especially with lateral motions   LOWER EXTREMITY ROM:   other motions guarded but WFL's  Active  Right eval Left eval  Hip flexion    Hip extension    Hip abduction    Hip adduction    Hip internal rotation    Hip external rotation     Knee flexion    Knee extension  -15  Ankle dorsiflexion    Ankle plantarflexion    Ankle inversion    Ankle eversion     (Blank rows = not tested)  LOWER EXTREMITY MMT:    MMT Right eval Left eval  Hip flexion 4 4-  Hip extension    Hip abduction 4- 4-  Hip adduction    Hip internal rotation    Hip external rotation    Knee flexion 4- 4-  Knee extension 4- 4-  Ankle dorsiflexion 4 4  Ankle plantarflexion    Ankle inversion    Ankle eversion     (Blank rows = not tested)  FUNCTIONAL TESTS:  5 times sit to stand: 20 seconds Timed up and go (TUG): 17 3 minute walk test: stopped at 2 minutes 360 feet  GAIT: Distance walked: 100 feet Assistive device utilized: None Level of assistance: Complete Independence Comments: first few steps the left leg really gave way and he stumbled has trendelenberg on the left, on stairs he did one step at a time  TODAY'S TREATMENT:                                                                                                                              DATE:    03/05/22 Nustep L 6 35mn Leg Press 40# 3 sets 10, feet 3 way Calf Raises 40# 2 sets 15 Lats 35# 2 sets15 Seated row 35#  2 sets 15 Black tband 20 x trunk flex and ext 25# triceps 2 sets 12 15# biceps 2 sets 12 20# upright row 2 sets 10 10# cable  pulley AR press and circles 10 x 3 way each side  Feet on ball K2C. Trunk rotation, posterior isometric and abdominal isometrics PROM LE and trunk    02/25/22 Nustep L 6 6 min Leg Press 40# 2 sets 10 Calf Raises 40# 2 sets 10 Lats 35# 2 sets15 Seated row 35#  2 sets 15 Black tband seated extension 20 x Cable pulley reciprocal punches and shdl ext Cable pulley trunk rotation 10# 12 x each 25# triceps 2 sets 12 5# biceps 2 sets 12 Feet on ball K2C. Trunk rotation, posterior isometric and abdominal isometrics PROM LE and trunk     02/23/22 Nustep level 5 x 7 minutes Leg press 40# 2x10 Lats 35# Seated row 35# Black  tband seated extension Straight arm pulls with cues for posture and form  25# triceps 5# biceps 10# AR press Feet on ball K2C. Trunk rotation, posterior isometric and abdominal isometrics PROM HS ands piriformis  02/18/22 Amb outside around entire parking lot front and back 7 min and 45 sec with 1 standing rest coming up hill PRE 4/10 Seated row and Lat Pull 35# 2 sets 10 Black Tband trunk flex and ext 2 sets 10 15# chest press 2x10 Leg press 30# 2 sets 10,calf raises 30# 2 sets 10 Feet on ball K2C, trunk rotation, small posterior activation, isometric abs Passive stretch of the LE's  02/11/22 Nustep level 5 x 5 minutes Walk outside around the back building 2 rest breaks 35# lats 2x10 35# rows 2x10 15# chest press 2x10 10# straight arm pulls 10# AR press Feet on ball K2C, trunk rotation, small posterior activation, isometric abs Passive stretch of the LE's  02/09/22 Nustep level 5 x 5 minutes Walk outside lap around the building in the back 3 standing rest breaks Seated rows 35# LAts 35# 2x10 10# straight arm pulls cues for core activation 15# AR press 15# chest press 2x10 Passive LE stretches  02/04/22 Nustep level 5 x 6 mintues Bike level 4 x 6 minutes 10# Straight arm pulls 2x10 15# AR press 25# HS curls 3x10 5# leg extension 2x10 20# rows 2x10 20# lats Feet on ball K2C, trunk rotation, posterior activation, isometric abs Passive stretch of the Hs and the piriformis 4 minute walk 6 laps = 700 feet  01/28/22 Nustep levl 5 x 6 minutes Bike LEvel 4 x 5 minutes Seated Row 25#  Lats 25# Chest press 10# Leg press 20# 3x10 LEg extension 5# 3x10 LEg curls 25# 3x10 10# straight arm pulls Passive HS and piriformis stretch Feet on ball K2C   01/21/22 3 minute walk test 600 feet, sore and tired with some SOB UBE level 3 x 4 minutes Bike LEvel 4 x 5 minutes 25# lats and Rows 2x10 each 5# chest press 2x10 20# leg press 2 x10 Leg curls 20# 2x10 LEg extension 5#  2x10 Feet on ball K2C, trunk rotation, posterior activation and isometric abs Passive HS and piriformis stretch  PATIENT EDUCATION:  Education details: POC Person educated: Patient Education method: Explanation Education comprehension: verbalized understanding  HOME EXERCISE PROGRAM: Reviewed HEP from the hospital  ASSESSMENT:  CLINICAL IMPRESSION: Patient continues reporting that he is moving and doing more and better at home with minimal pain.  Progressed ex with cuing for better posture with ex.Assessed goals OBJECTIVE IMPAIRMENTS: Abnormal gait, cardiopulmonary status limiting activity, decreased activity tolerance, decreased balance, decreased endurance, decreased mobility, difficulty walking, decreased ROM, decreased strength, increased muscle spasms, impaired flexibility, improper body mechanics, postural dysfunction, and  pain.   REHAB POTENTIAL: Good  CLINICAL DECISION MAKING: Evolving/moderate complexity  EVALUATION COMPLEXITY: Low   GOALS: Goals reviewed with patient? Yes  SHORT TERM GOALS: Target date: 01/10/22  Independent with initial HEP Goal status: met  LONG TERM GOALS: Target date: 03/31/22  Understand posture and body mechanics  Goal status: ongoing 02/23/22 and 03/05/22  2.  Decrease pain 50% Goal status: progressing 02/11/22, MET 02/18/22 80-90% better  3.  Increase lumbar ROM 25% Goal status: ongoing 02/11/22 and 02/18/22, WFLS but hold himslef guarded at times 03/05/22  4.  Walk 1000 feet  Goal status: progressing 02/18/22 with standing rest break  5.  Be able to put on shoes and socks without difficulty Goal status: progressing 02/23/22 MET 03/05/22   PLAN:  PT FREQUENCY: 1-2x/week  PT DURATION: 12 weeks  PLANNED INTERVENTIONS: Therapeutic exercises, Therapeutic activity, Neuromuscular re-education, Balance training, Gait training, Patient/Family education, Self Care, Joint mobilization, Electrical stimulation, Cryotherapy, Moist heat, and Manual  therapy.  PLAN FOR NEXT SESSION:  progress strength and posture for return to PLOF  Bristyl Mclees,ANGIE, PTA 03/05/2022, 10:40 AM [facility] at [place]. [place], [place], [address] Phone: [phone]   Fax:  [facility] at [place]. [place], [place], [address] Phone: [phone]   Fax:  [phone]  Patient Details  Name: Joshua Ruiz MRN: [id number] Date of Birth: 10-30-1940 Referring Provider:  Haydee Salter, MD  Encounter Date: 03/05/2022   Laqueta Carina, PTA 03/05/2022, 10:40 AM  [facility] at [place]. [place], [place], [address] Phone: [phone]   Fax:  [facility] at [place]. [place], [place], [address] Phone: [phone]   Fax:  [phone]  Patient Details  Name: Joshua Ruiz MRN: [id number] Date of Birth: 03-16-1940 Referring Provider:  Haydee Salter, MD  Encounter Date: 03/05/2022   Laqueta Carina, PTA 03/05/2022, 10:39 AM  [facility] at [place]. [place], [place], [address] Phone: [phone]   Fax:  [phone]

## 2022-03-08 DIAGNOSIS — Z79899 Other long term (current) drug therapy: Secondary | ICD-10-CM | POA: Diagnosis not present

## 2022-03-08 DIAGNOSIS — M069 Rheumatoid arthritis, unspecified: Secondary | ICD-10-CM | POA: Diagnosis not present

## 2022-03-08 DIAGNOSIS — M47816 Spondylosis without myelopathy or radiculopathy, lumbar region: Secondary | ICD-10-CM | POA: Diagnosis not present

## 2022-03-08 DIAGNOSIS — M8949 Other hypertrophic osteoarthropathy, multiple sites: Secondary | ICD-10-CM | POA: Diagnosis not present

## 2022-03-09 ENCOUNTER — Encounter: Payer: Self-pay | Admitting: Physical Therapy

## 2022-03-09 ENCOUNTER — Ambulatory Visit: Payer: Medicare PPO | Admitting: Physical Therapy

## 2022-03-09 DIAGNOSIS — R293 Abnormal posture: Secondary | ICD-10-CM | POA: Diagnosis not present

## 2022-03-09 DIAGNOSIS — R262 Difficulty in walking, not elsewhere classified: Secondary | ICD-10-CM

## 2022-03-09 DIAGNOSIS — M5459 Other low back pain: Secondary | ICD-10-CM

## 2022-03-09 DIAGNOSIS — M6281 Muscle weakness (generalized): Secondary | ICD-10-CM

## 2022-03-09 NOTE — Therapy (Signed)
OUTPATIENT PHYSICAL THERAPY THORACOLUMBAR TREATMENT Patient Name: Joshua Ruiz MRN: [id number] DOB:1940-06-11, 82 y.o., male Today's Date: 03/09/2022  END OF SESSION:  PT End of Session - 03/09/22 1018     Visit Number 13    Date for PT Re-Evaluation 04/22/22    Authorization Type [employer]    Authorization Time Period 2/12    PT Start Time 1013    PT Stop Time 1100    PT Time Calculation (min) 47 min    Activity Tolerance Patient tolerated treatment well    Behavior During Therapy WFL for tasks assessed/performed             Past Medical History:  Diagnosis Date   GERD (gastroesophageal reflux disease)    Rheumatoid arthritis ([place])    Past Surgical History:  Procedure Laterality Date   EYE SURGERY Bilateral    cataract extraction   HEMORRHOID SURGERY     LAMINECTOMY WITH POSTERIOR LATERAL ARTHRODESIS LEVEL 3 N/A 11/20/2021   Procedure: LUMBAR TWO THROUGH LUMBAR THREE, LUMBAR THREE THROUGH LUMBAR FOUR, LUMBAR FOUR THROUGH LUMBAR FIVE LAMINECTOMY WITH FACETECTOMY, POSTERIOR SEGMENTAL INSTRUMENTATION, POSTEROLATERAL ARTHRODESIS;  Surgeon: Consuella Lose, MD;  Location: [facility];  Service: Neurosurgery;  Laterality: N/A;  3C   LYMPH NODE BIOPSY     Neck   TRIGGER FINGER RELEASE Right    3rd   Patient Active Problem List   Diagnosis Date Noted   Chronic rhinitis 02/15/2022   Eczema 02/15/2022   Spondylolisthesis of lumbar region 11/20/2021   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/2021   Lumbar spondylosis 10/20/2021   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-L5 level 10/20/2021   Primary osteoarthritis of left knee 08/14/2021   Chronic epididymitis 08/13/2020   Hives 07/11/2020   Rheumatoid arthritis ([place]) 07/11/2020   Osteoarthritis of left knee 07/11/2020   Cervical arthritis 07/11/2020   Gastroesophageal reflux disease 07/11/2020   Presbyopia of both eyes 04/20/2015   Pseudophakia of both eyes 04/20/2015    PCP: Gena Fray  REFERRING PROVIDER:  Kathyrn Sheriff  REFERRING DIAG: s/p lumbar fusion L2-5  Rationale for Evaluation and Treatment: Rehabilitation  THERAPY DIAG:  Other low back pain  Muscle weakness (generalized)  Difficulty in walking, not elsewhere classified  Abnormal posture  ONSET DATE: 11/20/21  SUBJECTIVE:                                                                                                                                                                                           SUBJECTIVE STATEMENT: No pain, just stiff and I fatigue easily.  I have to be careful lifting PERTINENT HISTORY:  See above  PAIN:  Are you having pain? Yes:  NPRS scale: 0/10 Pain location: low back Pain description: tender, tight, ache Aggravating factors: touching it, standing, shopping, gets tired "very quick" 5 minutes standing, pain up to 6/10 Relieving factors: sit rest  PRECAUTIONS: Back  WEIGHT BEARING RESTRICTIONS: No  FALLS:  Has patient fallen in last 6 months? No  LIVING ENVIRONMENT: Lives with: lives alone Lives in: House/apartment Stairs: No Has following equipment at home: None  OCCUPATION: retired  PLOF: Independent does own housework some gym a few days a week  PATIENT GOALS: feel better, better motions, less difficulty standing, pick up things  NEXT MD VISIT:  January 3  OBJECTIVE:   DIAGNOSTIC FINDINGS:  healing  PATIENT SURVEYS:  FOTO 33  SCREENING FOR RED FLAGS: Bowel or bladder incontinence: No Spinal tumors: No Cauda equina syndrome: No Compression fracture: No Abdominal aneurysm: No  COGNITION: Overall cognitive status: Within functional limits for tasks assessed     SENSATION: Mild numbness in the left thigh  MUSCLE LENGTH: Hamstrings: Right 50 deg; Left 40 deg  POSTURE: decreased lumbar lordosis  PALPATION: Very tight and tender in the lumbar paraspinals and the mild tenderness in the buttocks  LUMBAR ROM: Following BLT rules he is very stiff especially with  lateral motions   LOWER EXTREMITY ROM:   other motions guarded but WFL's  Active  Right eval Left eval  Hip flexion    Hip extension    Hip abduction    Hip adduction    Hip internal rotation    Hip external rotation    Knee flexion    Knee extension  -15  Ankle dorsiflexion    Ankle plantarflexion    Ankle inversion    Ankle eversion     (Blank rows = not tested)  LOWER EXTREMITY MMT:    MMT Right eval Left eval  Hip flexion 4 4-  Hip extension    Hip abduction 4- 4-  Hip adduction    Hip internal rotation    Hip external rotation    Knee flexion 4- 4-  Knee extension 4- 4-  Ankle dorsiflexion 4 4  Ankle plantarflexion    Ankle inversion    Ankle eversion     (Blank rows = not tested)  FUNCTIONAL TESTS:  5 times sit to stand: 20 seconds Timed up and go (TUG): 17 3 minute walk test: stopped at 2 minutes 360 feet  GAIT: Distance walked: 100 feet Assistive device utilized: None Level of assistance: Complete Independence Comments: first few steps the left leg really gave way and he stumbled has trendelenberg on the left, on stairs he did one step at a time  TODAY'S TREATMENT:                                                                                                                              DATE:   03/09/22 Nustep level 6 x 6 mintues 25# triceps 2x15 10# biceps 2x15 Seated row 25#  Lats 35#  3x10 Chest press 10# 3x10 LEg press 40# 3x10 LEg curls 25# 3x10 Leg extension 10# small ROM 3x10 PAssive stretch HS and piriformis Feet on ball small bridge and isometric abs   03/05/22 Nustep L 6 3mn Leg Press 40# 3 sets 10, feet 3 way Calf Raises 40# 2 sets 15 Lats 35# 2 sets15 Seated row 35#  2 sets 15 Black tband 20 x trunk flex and ext 25# triceps 2 sets 12 15# biceps 2 sets 12 20# upright row 2 sets 10 10# cable pulley AR press and circles 10 x 3 way each side  Feet on ball K2C. Trunk rotation, posterior isometric and abdominal  isometrics PROM LE and trunk    02/25/22 Nustep L 6 6 min Leg Press 40# 2 sets 10 Calf Raises 40# 2 sets 10 Lats 35# 2 sets15 Seated row 35#  2 sets 15 Black tband seated extension 20 x Cable pulley reciprocal punches and shdl ext Cable pulley trunk rotation 10# 12 x each 25# triceps 2 sets 12 5# biceps 2 sets 12 Feet on ball K2C. Trunk rotation, posterior isometric and abdominal isometrics PROM LE and trunk     02/23/22 Nustep level 5 x 7 minutes Leg press 40# 2x10 Lats 35# Seated row 35# Black tband seated extension Straight arm pulls with cues for posture and form  25# triceps 5# biceps 10# AR press Feet on ball K2C. Trunk rotation, posterior isometric and abdominal isometrics PROM HS ands piriformis  02/18/22 Amb outside around entire parking lot front and back 7 min and 45 sec with 1 standing rest coming up hill PRE 4/10 Seated row and Lat Pull 35# 2 sets 10 Black Tband trunk flex and ext 2 sets 10 15# chest press 2x10 Leg press 30# 2 sets 10,calf raises 30# 2 sets 10 Feet on ball K2C, trunk rotation, small posterior activation, isometric abs Passive stretch of the LE's  02/11/22 Nustep level 5 x 5 minutes Walk outside around the back building 2 rest breaks 35# lats 2x10 35# rows 2x10 15# chest press 2x10 10# straight arm pulls 10# AR press Feet on ball K2C, trunk rotation, small posterior activation, isometric abs Passive stretch of the LE's  02/09/22 Nustep level 5 x 5 minutes Walk outside lap around the building in the back 3 standing rest breaks Seated rows 35# LAts 35# 2x10 10# straight arm pulls cues for core activation 15# AR press 15# chest press 2x10 Passive LE stretches  02/04/22 Nustep level 5 x 6 mintues Bike level 4 x 6 minutes 10# Straight arm pulls 2x10 15# AR press 25# HS curls 3x10 5# leg extension 2x10 20# rows 2x10 20# lats Feet on ball K2C, trunk rotation, posterior activation, isometric abs Passive stretch of the Hs and the  piriformis 4 minute walk 6 laps = 700 feet  PATIENT EDUCATION:  Education details: POC Person educated: Patient Education method: Explanation Education comprehension: verbalized understanding  HOME EXERCISE PROGRAM: Reviewed HEP from the hospital  ASSESSMENT:  CLINICAL IMPRESSION: Pateint overall doing well, he just is moving very slow and stiff and guarded with his movements, he gets fatigued very easily.  Some cues needed for him to slow down with the exercises. OBJECTIVE IMPAIRMENTS: Abnormal gait, cardiopulmonary status limiting activity, decreased activity tolerance, decreased balance, decreased endurance, decreased mobility, difficulty walking, decreased ROM, decreased strength, increased muscle spasms, impaired flexibility, improper body mechanics, postural dysfunction, and pain.   REHAB POTENTIAL: Good  CLINICAL DECISION MAKING: Evolving/moderate complexity  EVALUATION COMPLEXITY: Low  GOALS: Goals reviewed with patient? Yes  SHORT TERM GOALS: Target date: 01/10/22  Independent with initial HEP Goal status: met  LONG TERM GOALS: Target date: 03/31/22  Understand posture and body mechanics  Goal status: progressing 03/09/22  2.  Decrease pain 50% Goal status: met 03/09/22  3.  Increase lumbar ROM 25% Goal status: ongoing 02/11/22 and 02/18/22, WFLS but hold himslef guarded at times 03/05/22  4.  Walk 1000 feet  Goal status: progressing 02/18/22 with standing rest break  5.  Be able to put on shoes and socks without difficulty Goal status: progressing 02/23/22 MET 03/05/22   PLAN:  PT FREQUENCY: 1-2x/week  PT DURATION: 12 weeks  PLANNED INTERVENTIONS: Therapeutic exercises, Therapeutic activity, Neuromuscular re-education, Balance training, Gait training, Patient/Family education, Self Care, Joint mobilization, Electrical stimulation, Cryotherapy, Moist heat, and Manual therapy.  PLAN FOR NEXT SESSION:  he will see the MD next week  Sumner Boast,  PT 03/09/2022, 10:19 AM [facility] at [place]. [place], [place], [address]

## 2022-03-10 ENCOUNTER — Telehealth: Payer: Self-pay | Admitting: Family Medicine

## 2022-03-10 DIAGNOSIS — J310 Chronic rhinitis: Secondary | ICD-10-CM

## 2022-03-10 NOTE — Telephone Encounter (Signed)
Patient needs referral for ENT entered in from last OV.  Please review and advise.  Thanks. Dm/cma

## 2022-03-10 NOTE — Telephone Encounter (Signed)
Pt called and stated that his last visit dr Gena Fray was going to refer the pt to ENT. Pt stated he never heard anything yet about that . Please give the pt a call

## 2022-03-11 ENCOUNTER — Ambulatory Visit: Payer: Medicare PPO | Admitting: Physical Therapy

## 2022-03-11 DIAGNOSIS — R262 Difficulty in walking, not elsewhere classified: Secondary | ICD-10-CM | POA: Diagnosis not present

## 2022-03-11 DIAGNOSIS — R293 Abnormal posture: Secondary | ICD-10-CM | POA: Diagnosis not present

## 2022-03-11 DIAGNOSIS — M5459 Other low back pain: Secondary | ICD-10-CM | POA: Diagnosis not present

## 2022-03-11 DIAGNOSIS — M6281 Muscle weakness (generalized): Secondary | ICD-10-CM | POA: Diagnosis not present

## 2022-03-11 NOTE — Therapy (Signed)
OUTPATIENT PHYSICAL THERAPY THORACOLUMBAR TREATMENT Patient Name: Joshua Ruiz MRN: [id number] DOB:1940-07-24, 82 y.o., male Today's Date: 03/11/2022  END OF SESSION:  PT End of Session - 03/11/22 1531     Visit Number 14    Date for PT Re-Evaluation 04/22/22    Authorization Type [employer]    PT Start Time 1530    PT Stop Time 1615    PT Time Calculation (min) 45 min             Past Medical History:  Diagnosis Date   GERD (gastroesophageal reflux disease)    Rheumatoid arthritis ([place])    Past Surgical History:  Procedure Laterality Date   EYE SURGERY Bilateral    cataract extraction   HEMORRHOID SURGERY     LAMINECTOMY WITH POSTERIOR LATERAL ARTHRODESIS LEVEL 3 N/A 11/20/2021   Procedure: LUMBAR TWO THROUGH LUMBAR THREE, LUMBAR THREE THROUGH LUMBAR FOUR, LUMBAR FOUR THROUGH LUMBAR FIVE LAMINECTOMY WITH FACETECTOMY, POSTERIOR SEGMENTAL INSTRUMENTATION, POSTEROLATERAL ARTHRODESIS;  Surgeon: Consuella Lose, MD;  Location: [facility];  Service: Neurosurgery;  Laterality: N/A;  3C   LYMPH NODE BIOPSY     Neck   TRIGGER FINGER RELEASE Right    3rd   Patient Active Problem List   Diagnosis Date Noted   Chronic rhinitis 02/15/2022   Eczema 02/15/2022   Spondylolisthesis of lumbar region 11/20/2021   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/2021   Lumbar spondylosis 10/20/2021   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-L5 level 10/20/2021   Primary osteoarthritis of left knee 08/14/2021   Chronic epididymitis 08/13/2020   Hives 07/11/2020   Rheumatoid arthritis ([place]) 07/11/2020   Osteoarthritis of left knee 07/11/2020   Cervical arthritis 07/11/2020   Gastroesophageal reflux disease 07/11/2020   Presbyopia of both eyes 04/20/2015   Pseudophakia of both eyes 04/20/2015    PCP: Gena Fray  REFERRING PROVIDER: Kathyrn Sheriff  REFERRING DIAG: s/p lumbar fusion L2-5  Rationale for Evaluation and Treatment: Rehabilitation  THERAPY DIAG:  Muscle weakness (generalized)  Difficulty  in walking, not elsewhere classified  ONSET DATE: 11/20/21  SUBJECTIVE:                                                                                                                                                                                           SUBJECTIVE STATEMENT: No pain, just stiff and I fatigue easily.  PERTINENT HISTORY:  See above  PAIN:  Are you having pain? Yes: NPRS scale: 0/10 Pain location: low back Pain description: tender, tight, ache Aggravating factors: touching it, standing, shopping, gets tired "very quick" 5 minutes standing, pain up to 6/10 Relieving factors: sit rest  PRECAUTIONS: Back  WEIGHT BEARING RESTRICTIONS:  No  FALLS:  Has patient fallen in last 6 months? No  LIVING ENVIRONMENT: Lives with: lives alone Lives in: House/apartment Stairs: No Has following equipment at home: None  OCCUPATION: retired  PLOF: Independent does own housework some gym a few days a week  PATIENT GOALS: feel better, better motions, less difficulty standing, pick up things  NEXT MD VISIT:  January 3  OBJECTIVE:   DIAGNOSTIC FINDINGS:  healing  PATIENT SURVEYS:  FOTO 33  SCREENING FOR RED FLAGS: Bowel or bladder incontinence: No Spinal tumors: No Cauda equina syndrome: No Compression fracture: No Abdominal aneurysm: No  COGNITION: Overall cognitive status: Within functional limits for tasks assessed     SENSATION: Mild numbness in the left thigh  MUSCLE LENGTH: Hamstrings: Right 50 deg; Left 40 deg  POSTURE: decreased lumbar lordosis  PALPATION: Very tight and tender in the lumbar paraspinals and the mild tenderness in the buttocks  LUMBAR ROM: Following BLT rules he is very stiff especially with lateral motions   LOWER EXTREMITY ROM:   other motions guarded but WFL's  Active  Right eval Left eval  Hip flexion    Hip extension    Hip abduction    Hip adduction    Hip internal rotation    Hip external rotation    Knee  flexion    Knee extension  -15  Ankle dorsiflexion    Ankle plantarflexion    Ankle inversion    Ankle eversion     (Blank rows = not tested)  LOWER EXTREMITY MMT:    MMT Right eval Left eval  Hip flexion 4 4-  Hip extension    Hip abduction 4- 4-  Hip adduction    Hip internal rotation    Hip external rotation    Knee flexion 4- 4-  Knee extension 4- 4-  Ankle dorsiflexion 4 4  Ankle plantarflexion    Ankle inversion    Ankle eversion     (Blank rows = not tested)  FUNCTIONAL TESTS:  5 times sit to stand: 20 seconds Timed up and go (TUG): 17 3 minute walk test: stopped at 2 minutes 360 feet  GAIT: Distance walked: 100 feet Assistive device utilized: None Level of assistance: Complete Independence Comments: first few steps the left leg really gave way and he stumbled has trendelenberg on the left, on stairs he did one step at a time  TODAY'S TREATMENT:                                                                                                                              DATE:    03/11/22 Nustep L 6 7 min Marching with OH press 10 feet 2 x 4# Walking with shld flex 10 feet 2 x 4# Walking with shld abd 10 feet 2 x 4# STS with wt ball pass 10 x Ball tap on airex to work on upright posture and decreasing stiffness Posterior lean onto BOSU with wt  ball press 10 x Dynamic pulley ex Fast walking with ball toss PROM LE and trunk    03/09/22 Nustep level 6 x 6 mintues 25# triceps 2x15 10# biceps 2x15 Seated row 25#  Lats 35# 3x10 Chest press 10# 3x10 LEg press 40# 3x10 LEg curls 25# 3x10 Leg extension 10# small ROM 3x10 PAssive stretch HS and piriformis Feet on ball small bridge and isometric abs   03/05/22 Nustep L 6 40mn Leg Press 40# 3 sets 10, feet 3 way Calf Raises 40# 2 sets 15 Lats 35# 2 sets15 Seated row 35#  2 sets 15 Black tband 20 x trunk flex and ext 25# triceps 2 sets 12 15# biceps 2 sets 12 20# upright row 2 sets 10 10# cable  pulley AR press and circles 10 x 3 way each side  Feet on ball K2C. Trunk rotation, posterior isometric and abdominal isometrics PROM LE and trunk    02/25/22 Nustep L 6 6 min Leg Press 40# 2 sets 10 Calf Raises 40# 2 sets 10 Lats 35# 2 sets15 Seated row 35#  2 sets 15 Black tband seated extension 20 x Cable pulley reciprocal punches and shdl ext Cable pulley trunk rotation 10# 12 x each 25# triceps 2 sets 12 5# biceps 2 sets 12 Feet on ball K2C. Trunk rotation, posterior isometric and abdominal isometrics PROM LE and trunk     02/23/22 Nustep level 5 x 7 minutes Leg press 40# 2x10 Lats 35# Seated row 35# Black tband seated extension Straight arm pulls with cues for posture and form  25# triceps 5# biceps 10# AR press Feet on ball K2C. Trunk rotation, posterior isometric and abdominal isometrics PROM HS ands piriformis  02/18/22 Amb outside around entire parking lot front and back 7 min and 45 sec with 1 standing rest coming up hill PRE 4/10 Seated row and Lat Pull 35# 2 sets 10 Black Tband trunk flex and ext 2 sets 10 15# chest press 2x10 Leg press 30# 2 sets 10,calf raises 30# 2 sets 10 Feet on ball K2C, trunk rotation, small posterior activation, isometric abs Passive stretch of the LE's  02/11/22 Nustep level 5 x 5 minutes Walk outside around the back building 2 rest breaks 35# lats 2x10 35# rows 2x10 15# chest press 2x10 10# straight arm pulls 10# AR press Feet on ball K2C, trunk rotation, small posterior activation, isometric abs Passive stretch of the LE's  02/09/22 Nustep level 5 x 5 minutes Walk outside lap around the building in the back 3 standing rest breaks Seated rows 35# LAts 35# 2x10 10# straight arm pulls cues for core activation 15# AR press 15# chest press 2x10 Passive LE stretches  02/04/22 Nustep level 5 x 6 mintues Bike level 4 x 6 minutes 10# Straight arm pulls 2x10 15# AR press 25# HS curls 3x10 5# leg extension 2x10 20# rows  2x10 20# lats Feet on ball K2C, trunk rotation, posterior activation, isometric abs Passive stretch of the Hs and the piriformis 4 minute walk 6 laps = 700 feet  PATIENT EDUCATION:  Education details: POC Person educated: Patient Education method: Explanation Education comprehension: verbalized understanding  HOME EXERCISE PROGRAM: Reviewed HEP from the hospital  ASSESSMENT:  CLINICAL IMPRESSION: Pateint overall doing well, he just is moving very slow and stiff and guarded with his movements, he gets fatigued very easily.  Worked to increase more dynamci mvmt with cuing for speed and control.  OBJECTIVE IMPAIRMENTS: Abnormal gait, cardiopulmonary status limiting activity, decreased activity tolerance,  decreased balance, decreased endurance, decreased mobility, difficulty walking, decreased ROM, decreased strength, increased muscle spasms, impaired flexibility, improper body mechanics, postural dysfunction, and pain.   REHAB POTENTIAL: Good  CLINICAL DECISION MAKING: Evolving/moderate complexity  EVALUATION COMPLEXITY: Low   GOALS: Goals reviewed with patient? Yes  SHORT TERM GOALS: Target date: 01/10/22  Independent with initial HEP Goal status: met  LONG TERM GOALS: Target date: 03/31/22  Understand posture and body mechanics  Goal status: progressing 03/09/22  2.  Decrease pain 50% Goal status: met 03/09/22  3.  Increase lumbar ROM 25% Goal status: ongoing 02/11/22 and 02/18/22, WFLS but hold himslef guarded at times 03/05/22  4.  Walk 1000 feet  Goal status: progressing 02/18/22 with standing rest break  5.  Be able to put on shoes and socks without difficulty Goal status: progressing 02/23/22 MET 03/05/22   PLAN:  PT FREQUENCY: 1-2x/week  PT DURATION: 12 weeks  PLANNED INTERVENTIONS: Therapeutic exercises, Therapeutic activity, Neuromuscular re-education, Balance training, Gait training, Patient/Family education, Self Care, Joint mobilization, Electrical  stimulation, Cryotherapy, Moist heat, and Manual therapy.  PLAN FOR NEXT SESSION:  he will see the MD next week- assess ad progress  Amariyana Heacox,ANGIE, PTA 03/11/2022, 3:31 PM [facility] at [place]. [place], [place], [place] [facility] [place]. [place], [place], [address] Phone: [phone]   Fax:  [phone]  Patient Details  Name: Joshua Ruiz MRN: [id number] Date of Birth: 1940/08/21 Referring Provider:  Haydee Salter, MD  Encounter Date: 03/11/2022   Laqueta Carina, PTA 03/11/2022, 3:31 PM  [facility] at [place]. [place], [place], [address] Phone: [phone]   Fax:  [phone]

## 2022-03-15 DIAGNOSIS — Z6830 Body mass index (BMI) 30.0-30.9, adult: Secondary | ICD-10-CM | POA: Diagnosis not present

## 2022-03-15 DIAGNOSIS — M4316 Spondylolisthesis, lumbar region: Secondary | ICD-10-CM | POA: Diagnosis not present

## 2022-03-16 ENCOUNTER — Encounter: Payer: Self-pay | Admitting: Physical Therapy

## 2022-03-16 ENCOUNTER — Ambulatory Visit: Payer: Medicare PPO | Attending: Neurosurgery | Admitting: Physical Therapy

## 2022-03-16 DIAGNOSIS — M6281 Muscle weakness (generalized): Secondary | ICD-10-CM

## 2022-03-16 DIAGNOSIS — R293 Abnormal posture: Secondary | ICD-10-CM

## 2022-03-16 DIAGNOSIS — R262 Difficulty in walking, not elsewhere classified: Secondary | ICD-10-CM

## 2022-03-16 DIAGNOSIS — M5459 Other low back pain: Secondary | ICD-10-CM

## 2022-03-16 NOTE — Therapy (Signed)
OUTPATIENT PHYSICAL THERAPY THORACOLUMBAR TREATMENT Patient Name: Joshua Ruiz MRN: [id number] DOB:[birth year], 82 y.o., male Today's Date: 03/16/2022  END OF SESSION:  PT End of Session - 03/16/22 1445     Visit Number 15    Date for PT Re-Evaluation 04/22/22    Authorization Type [employer]    Authorization Time Period 4/12    PT Start Time 1439    PT Stop Time 1527    PT Time Calculation (min) 48 min    Activity Tolerance Patient tolerated treatment well    Behavior During Therapy WFL for tasks assessed/performed             Past Medical History:  Diagnosis Date   GERD (gastroesophageal reflux disease)    Rheumatoid arthritis ([place])    Past Surgical History:  Procedure Laterality Date   EYE SURGERY Bilateral    cataract extraction   HEMORRHOID SURGERY     LAMINECTOMY WITH POSTERIOR LATERAL ARTHRODESIS LEVEL 3 N/A 11/20/2021   Procedure: LUMBAR TWO THROUGH LUMBAR THREE, LUMBAR THREE THROUGH LUMBAR FOUR, LUMBAR FOUR THROUGH LUMBAR FIVE LAMINECTOMY WITH FACETECTOMY, POSTERIOR SEGMENTAL INSTRUMENTATION, POSTEROLATERAL ARTHRODESIS;  Surgeon: Consuella Lose, MD;  Location: [facility];  Service: Neurosurgery;  Laterality: N/A;  3C   LYMPH NODE BIOPSY     Neck   TRIGGER FINGER RELEASE Right    3rd   Patient Active Problem List   Diagnosis Date Noted   Chronic rhinitis 02/15/2022   Eczema 02/15/2022   Spondylolisthesis of lumbar region 11/20/2021   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/2021   Lumbar spondylosis 10/20/2021   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-L5 level 10/20/2021   Primary osteoarthritis of left knee 08/14/2021   Chronic epididymitis 08/13/2020   Hives 07/11/2020   Rheumatoid arthritis ([place]) 07/11/2020   Osteoarthritis of left knee 07/11/2020   Cervical arthritis 07/11/2020   Gastroesophageal reflux disease 07/11/2020   Presbyopia of both eyes 04/20/2015   Pseudophakia of both eyes 04/20/2015    PCP: Gena Fray  REFERRING PROVIDER: Kathyrn Sheriff  REFERRING  DIAG: s/p lumbar fusion L2-5  Rationale for Evaluation and Treatment: Rehabilitation  THERAPY DIAG:  Muscle weakness (generalized)  Difficulty in walking, not elsewhere classified  Other low back pain  Abnormal posture  ONSET DATE: 11/20/21  SUBJECTIVE:                                                                                                                                                                                           SUBJECTIVE STATEMENT: Saw MD. He was pleased, thought I was doing very well, I don't have top see him again PERTINENT HISTORY:  See above  PAIN:  Are you  having pain? Yes: NPRS scale: 0/10 Pain location: low back Pain description: tender, tight, ache Aggravating factors: touching it, standing, shopping, gets tired "very quick" 5 minutes standing, pain up to 6/10 Relieving factors: sit rest  PRECAUTIONS: Back  WEIGHT BEARING RESTRICTIONS: No  FALLS:  Has patient fallen in last 6 months? No  LIVING ENVIRONMENT: Lives with: lives alone Lives in: House/apartment Stairs: No Has following equipment at home: None  OCCUPATION: retired  PLOF: Independent does own housework some gym a few days a week  PATIENT GOALS: feel better, better motions, less difficulty standing, pick up things  NEXT MD VISIT:  January 3  OBJECTIVE:   DIAGNOSTIC FINDINGS:  healing  PATIENT SURVEYS:  FOTO 33  SCREENING FOR RED FLAGS: Bowel or bladder incontinence: No Spinal tumors: No Cauda equina syndrome: No Compression fracture: No Abdominal aneurysm: No  COGNITION: Overall cognitive status: Within functional limits for tasks assessed     SENSATION: Mild numbness in the left thigh  MUSCLE LENGTH: Hamstrings: Right 50 deg; Left 40 deg  POSTURE: decreased lumbar lordosis  PALPATION: Very tight and tender in the lumbar paraspinals and the mild tenderness in the buttocks  LUMBAR ROM: Following BLT rules he is very stiff especially with lateral  motions   LOWER EXTREMITY ROM:   other motions guarded but WFL's  Active  Right eval Left eval  Hip flexion    Hip extension    Hip abduction    Hip adduction    Hip internal rotation    Hip external rotation    Knee flexion    Knee extension  -15  Ankle dorsiflexion    Ankle plantarflexion    Ankle inversion    Ankle eversion     (Blank rows = not tested)  LOWER EXTREMITY MMT:    MMT Right eval Left eval  Hip flexion 4 4-  Hip extension    Hip abduction 4- 4-  Hip adduction    Hip internal rotation    Hip external rotation    Knee flexion 4- 4-  Knee extension 4- 4-  Ankle dorsiflexion 4 4  Ankle plantarflexion    Ankle inversion    Ankle eversion     (Blank rows = not tested)  FUNCTIONAL TESTS:  5 times sit to stand: 20 seconds Timed up and go (TUG): 17 3 minute walk test: stopped at 2 minutes 360 feet  GAIT: Distance walked: 100 feet Assistive device utilized: None Level of assistance: Complete Independence Comments: first few steps the left leg really gave way and he stumbled has trendelenberg on the left, on stairs he did one step at a time  TODAY'S TREATMENT:                                                                                                                              DATE:   03/16/22 Nustep level 5 x 6 mintues side step on and off airex Gait around the  back building fair pace, 2 rest breaks 10# straight arm pulls 2.5# standing hip abduction, marching and extension Facing wall hand slides up for posture  3# wate bar overhead carry On airex cone toe touches Doorway stretch Leg press 40# 2x10 Walking direction changes  03/11/22 Nustep L 6 7 min Marching with OH press 10 feet 2 x 4# Walking with shld flex 10 feet 2 x 4# Walking with shld abd 10 feet 2 x 4# STS with wt ball pass 10 x Ball tap on airex to work on upright posture and decreasing stiffness Posterior lean onto BOSU with wt ball press 10 x Dynamic pulley ex Fast  walking with ball toss PROM LE and trunk  03/09/22 Nustep level 6 x 6 mintues 25# triceps 2x15 10# biceps 2x15 Seated row 25#  Lats 35# 3x10 Chest press 10# 3x10 LEg press 40# 3x10 LEg curls 25# 3x10 Leg extension 10# small ROM 3x10 PAssive stretch HS and piriformis Feet on ball small bridge and isometric abs   03/05/22 Nustep L 6 63mn Leg Press 40# 3 sets 10, feet 3 way Calf Raises 40# 2 sets 15 Lats 35# 2 sets15 Seated row 35#  2 sets 15 Black tband 20 x trunk flex and ext 25# triceps 2 sets 12 15# biceps 2 sets 12 20# upright row 2 sets 10 10# cable pulley AR press and circles 10 x 3 way each side  Feet on ball K2C. Trunk rotation, posterior isometric and abdominal isometrics PROM LE and trunk    02/25/22 Nustep L 6 6 min Leg Press 40# 2 sets 10 Calf Raises 40# 2 sets 10 Lats 35# 2 sets15 Seated row 35#  2 sets 15 Black tband seated extension 20 x Cable pulley reciprocal punches and shdl ext Cable pulley trunk rotation 10# 12 x each 25# triceps 2 sets 12 5# biceps 2 sets 12 Feet on ball K2C. Trunk rotation, posterior isometric and abdominal isometrics PROM LE and trunk     PATIENT EDUCATION:  Education details: POC Person educated: Patient Education method: Explanation Education comprehension: verbalized understanding  HOME EXERCISE PROGRAM: Reviewed HEP from the hospital  ASSESSMENT:  CLINICAL IMPRESSION: Pateint doing well, today was the best that he has moved for me, a little faster and a little more steady, did not need as much breaks, still really struggles with getting gup from supine OBJECTIVE IMPAIRMENTS: Abnormal gait, cardiopulmonary status limiting activity, decreased activity tolerance, decreased balance, decreased endurance, decreased mobility, difficulty walking, decreased ROM, decreased strength, increased muscle spasms, impaired flexibility, improper body mechanics, postural dysfunction, and pain.   REHAB POTENTIAL: Good  CLINICAL  DECISION MAKING: Evolving/moderate complexity  EVALUATION COMPLEXITY: Low   GOALS: Goals reviewed with patient? Yes  SHORT TERM GOALS: Target date: 01/10/22  Independent with initial HEP Goal status: met  LONG TERM GOALS: Target date: 03/31/22  Understand posture and body mechanics  Goal status: progressing 03/09/22  2.  Decrease pain 50% Goal status: met 03/09/22  3.  Increase lumbar ROM 25% Goal status: ongoing 02/11/22 and 02/18/22, WFLS but hold himslef guarded at times 03/05/22  4.  Walk 1000 feet  Goal status: progressing 02/18/22 with standing rest break  5.  Be able to put on shoes and socks without difficulty Goal status: progressing 02/23/22 MET 03/05/22   PLAN:  PT FREQUENCY: 1-2x/week  PT DURATION: 12 weeks  PLANNED INTERVENTIONS: Therapeutic exercises, Therapeutic activity, Neuromuscular re-education, Balance training, Gait training, Patient/Family education, Self Care, Joint mobilization, Electrical stimulation, Cryotherapy, Moist heat, and  Manual therapy.  PLAN FOR NEXT SESSION:  continue to work on his posture, strength and overall function  Sumner Boast, PT 03/16/2022, 2:46 PM [facility] at [place]. [place], [place], [facility]

## 2022-03-17 ENCOUNTER — Telehealth: Payer: Self-pay | Admitting: Orthopaedic Surgery

## 2022-03-17 NOTE — Telephone Encounter (Signed)
Patient called asked if he can be set up for the gel injection in his left knee? The number to contact patient is [phone]

## 2022-03-18 ENCOUNTER — Ambulatory Visit: Payer: Medicare PPO | Admitting: Physical Therapy

## 2022-03-18 DIAGNOSIS — R262 Difficulty in walking, not elsewhere classified: Secondary | ICD-10-CM | POA: Diagnosis not present

## 2022-03-18 DIAGNOSIS — M6281 Muscle weakness (generalized): Secondary | ICD-10-CM | POA: Diagnosis not present

## 2022-03-18 DIAGNOSIS — M5459 Other low back pain: Secondary | ICD-10-CM | POA: Diagnosis not present

## 2022-03-18 DIAGNOSIS — R293 Abnormal posture: Secondary | ICD-10-CM | POA: Diagnosis not present

## 2022-03-18 NOTE — Therapy (Signed)
OUTPATIENT PHYSICAL THERAPY THORACOLUMBAR TREATMENT Patient Name: Joshua Ruiz MRN: [id number] DOB:17-Feb-1940, 82 y.o., male Today's Date: 03/18/2022  END OF SESSION:  PT End of Session - 03/18/22 0756     Visit Number 16    Date for PT Re-Evaluation 04/22/22    Authorization Type [employer]    PT Start Time 0800    PT Stop Time 0845    PT Time Calculation (min) 45 min             Past Medical History:  Diagnosis Date   GERD (gastroesophageal reflux disease)    Rheumatoid arthritis ([place])    Past Surgical History:  Procedure Laterality Date   EYE SURGERY Bilateral    cataract extraction   HEMORRHOID SURGERY     LAMINECTOMY WITH POSTERIOR LATERAL ARTHRODESIS LEVEL 3 N/A 11/20/2021   Procedure: LUMBAR TWO THROUGH LUMBAR THREE, LUMBAR THREE THROUGH LUMBAR FOUR, LUMBAR FOUR THROUGH LUMBAR FIVE LAMINECTOMY WITH FACETECTOMY, POSTERIOR SEGMENTAL INSTRUMENTATION, POSTEROLATERAL ARTHRODESIS;  Surgeon: Consuella Lose, MD;  Location: [facility];  Service: Neurosurgery;  Laterality: N/A;  3C   LYMPH NODE BIOPSY     Neck   TRIGGER FINGER RELEASE Right    3rd   Patient Active Problem List   Diagnosis Date Noted   Chronic rhinitis 02/15/2022   Eczema 02/15/2022   Spondylolisthesis of lumbar region 11/20/2021   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/2021   Lumbar spondylosis 10/20/2021   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-L5 level 10/20/2021   Primary osteoarthritis of left knee 08/14/2021   Chronic epididymitis 08/13/2020   Hives 07/11/2020   Rheumatoid arthritis ([place]) 07/11/2020   Osteoarthritis of left knee 07/11/2020   Cervical arthritis 07/11/2020   Gastroesophageal reflux disease 07/11/2020   Presbyopia of both eyes 04/20/2015   Pseudophakia of both eyes 04/20/2015    PCP: Gena Fray  REFERRING PROVIDER: Kathyrn Sheriff  REFERRING DIAG: s/p lumbar fusion L2-5  Rationale for Evaluation and Treatment: Rehabilitation  THERAPY DIAG:  Muscle weakness (generalized)  Difficulty  in walking, not elsewhere classified  Abnormal posture  ONSET DATE: 11/20/21  SUBJECTIVE:                                                                                                                                                                                           SUBJECTIVE STATEMENT: Working to approval for gel for knee. Back is good PERTINENT HISTORY:  See above  PAIN:  Are you having pain? Yes: NPRS scale: 0/10 Pain location: low back Pain description: tender, tight, ache Aggravating factors: touching it, standing, shopping, gets tired "very quick" 5 minutes standing, pain up to 6/10 Relieving factors: sit rest  PRECAUTIONS: Back  WEIGHT BEARING RESTRICTIONS: No  FALLS:  Has patient fallen in last 6 months? No  LIVING ENVIRONMENT: Lives with: lives alone Lives in: House/apartment Stairs: No Has following equipment at home: None  OCCUPATION: retired  PLOF: Independent does own housework some gym a few days a week  PATIENT GOALS: feel better, better motions, less difficulty standing, pick up things  NEXT MD VISIT:  January 3  OBJECTIVE:   DIAGNOSTIC FINDINGS:  healing  PATIENT SURVEYS:  FOTO 33  SCREENING FOR RED FLAGS: Bowel or bladder incontinence: No Spinal tumors: No Cauda equina syndrome: No Compression fracture: No Abdominal aneurysm: No  COGNITION: Overall cognitive status: Within functional limits for tasks assessed     SENSATION: Mild numbness in the left thigh  MUSCLE LENGTH: Hamstrings: Right 50 deg; Left 40 deg  POSTURE: decreased lumbar lordosis  PALPATION: Very tight and tender in the lumbar paraspinals and the mild tenderness in the buttocks  LUMBAR ROM: Following BLT rules he is very stiff especially with lateral motions   LOWER EXTREMITY ROM:   other motions guarded but WFL's  Active  Right eval Left eval  Hip flexion    Hip extension    Hip abduction    Hip adduction    Hip internal rotation    Hip  external rotation    Knee flexion    Knee extension  -15  Ankle dorsiflexion    Ankle plantarflexion    Ankle inversion    Ankle eversion     (Blank rows = not tested)  LOWER EXTREMITY MMT:    MMT Right eval Left eval  Hip flexion 4 4-  Hip extension    Hip abduction 4- 4-  Hip adduction    Hip internal rotation    Hip external rotation    Knee flexion 4- 4-  Knee extension 4- 4-  Ankle dorsiflexion 4 4  Ankle plantarflexion    Ankle inversion    Ankle eversion     (Blank rows = not tested)  FUNCTIONAL TESTS:  5 times sit to stand: 20 seconds Timed up and go (TUG): 17 3 minute walk test: stopped at 2 minutes 360 feet  GAIT: Distance walked: 100 feet Assistive device utilized: None Level of assistance: Complete Independence Comments: first few steps the left leg really gave way and he stumbled has trendelenberg on the left, on stairs he did one step at a time  TODAY'S TREATMENT:                                                                                                                              DATE:    03/18/22 UBE standing L 3 2 min fwd/2 min back TM 5 min working on cadeance and stamina 1.2-1.5 mph Blue wt ball lean back onto BOSU sitting with chest press as he comes to back up to sitting, then 10 x with lean back and up into standing chest press ( PTA assist to  stab and keep LE down with weak core tries to engage and use hip flexors) Standing blue wt ball OH ext 10x ( limited ext and LB pulling) Standing wt ball trunk rotation 10 each Cable pulleys BIL hip 4 way 10 x each 10 # Cable pulley shld ext and rows with core engagement 15# [employer] 2 laps each hand 10# Leg Press 40# 2 sets 15. Calf raises 40# 2 sets 15 Black tband trunk flex and ext 20 x each     03/16/22 Nustep level 5 x 6 mintues side step on and off airex Gait around the back building fair pace, 2 rest breaks 10# straight arm pulls 2.5# standing hip abduction, marching and  extension Facing wall hand slides up for posture  3# wate bar overhead carry On airex cone toe touches Doorway stretch Leg press 40# 2x10 Walking direction changes  03/11/22 Nustep L 6 7 min Marching with OH press 10 feet 2 x 4# Walking with shld flex 10 feet 2 x 4# Walking with shld abd 10 feet 2 x 4# STS with wt ball pass 10 x Ball tap on airex to work on upright posture and decreasing stiffness Posterior lean onto BOSU with wt ball press 10 x Dynamic pulley ex Fast walking with ball toss PROM LE and trunk  03/09/22 Nustep level 6 x 6 mintues 25# triceps 2x15 10# biceps 2x15 Seated row 25#  Lats 35# 3x10 Chest press 10# 3x10 LEg press 40# 3x10 LEg curls 25# 3x10 Leg extension 10# small ROM 3x10 PAssive stretch HS and piriformis Feet on ball small bridge and isometric abs   03/05/22 Nustep L 6 90mn Leg Press 40# 3 sets 10, feet 3 way Calf Raises 40# 2 sets 15 Lats 35# 2 sets15 Seated row 35#  2 sets 15 Black tband 20 x trunk flex and ext 25# triceps 2 sets 12 15# biceps 2 sets 12 20# upright row 2 sets 10 10# cable pulley AR press and circles 10 x 3 way each side  Feet on ball K2C. Trunk rotation, posterior isometric and abdominal isometrics PROM LE and trunk    02/25/22 Nustep L 6 6 min Leg Press 40# 2 sets 10 Calf Raises 40# 2 sets 10 Lats 35# 2 sets15 Seated row 35#  2 sets 15 Black tband seated extension 20 x Cable pulley reciprocal punches and shdl ext Cable pulley trunk rotation 10# 12 x each 25# triceps 2 sets 12 5# biceps 2 sets 12 Feet on ball K2C. Trunk rotation, posterior isometric and abdominal isometrics PROM LE and trunk     PATIENT EDUCATION:  Education details: POC Person educated: Patient Education method: Explanation Education comprehension: verbalized understanding  HOME EXERCISE PROGRAM: Reviewed HEP from the hospital  ASSESSMENT:  CLINICAL IMPRESSION: Progressed ex for strength with core focus activation - assitance and  cuing as doc above.Assessed and documented goals. OBJECTIVE IMPAIRMENTS: Abnormal gait, cardiopulmonary status limiting activity, decreased activity tolerance, decreased balance, decreased endurance, decreased mobility, difficulty walking, decreased ROM, decreased strength, increased muscle spasms, impaired flexibility, improper body mechanics, postural dysfunction, and pain.   REHAB POTENTIAL: Good  CLINICAL DECISION MAKING: Evolving/moderate complexity  EVALUATION COMPLEXITY: Low   GOALS: Goals reviewed with patient? Yes  SHORT TERM GOALS: Target date: 01/10/22  Independent with initial HEP Goal status: met  LONG TERM GOALS: Target date: 03/31/22  Understand posture and body mechanics  Goal status: progressing 03/09/22 progressing 03/18/22  2.  Decrease pain 50% Goal status: met 03/09/22  3.  Increase lumbar ROM 25% Goal status: ongoing 02/11/22 and 02/18/22, WFLS but hold himslef guarded at times 03/05/22  4.  Walk 1000 feet  Goal status: progressing 02/18/22 with standing rest break 03/18/22 progressing  5.  Be able to put on shoes and socks without difficulty Goal status: progressing 02/23/22 MET 03/05/22   PLAN:  PT FREQUENCY: 1-2x/week  PT DURATION: 12 weeks  PLANNED INTERVENTIONS: Therapeutic exercises, Therapeutic activity, Neuromuscular re-education, Balance training, Gait training, Patient/Family education, Self Care, Joint mobilization, Electrical stimulation, Cryotherapy, Moist heat, and Manual therapy.  PLAN FOR NEXT SESSION:  continue to work on his posture, strength and overall function  Garrick Midgley,ANGIE, PTA 03/18/2022, 7:56 AM [facility] [place]. [place], [place], [place] [place]. [place], [place], [address] Phone: [phone]   Fax:  [phone]  Patient Details  Name: Joshua Ruiz MRN: [id number] Date of  Birth: 1940-07-05 Referring Provider:  Haydee Salter, MD  Encounter Date: 03/18/2022   Laqueta Carina, PTA 03/18/2022, 7:56 AM  [facility] at [place]. [place], [place], [address] Phone: [phone]   Fax:  [phone]

## 2022-03-19 NOTE — Telephone Encounter (Signed)
VOB submitted for [facility], left knee.  

## 2022-03-23 ENCOUNTER — Encounter: Payer: Self-pay | Admitting: Physical Therapy

## 2022-03-23 ENCOUNTER — Ambulatory Visit: Payer: Medicare PPO | Admitting: Physical Therapy

## 2022-03-23 DIAGNOSIS — M5459 Other low back pain: Secondary | ICD-10-CM | POA: Diagnosis not present

## 2022-03-23 DIAGNOSIS — R293 Abnormal posture: Secondary | ICD-10-CM | POA: Diagnosis not present

## 2022-03-23 DIAGNOSIS — R262 Difficulty in walking, not elsewhere classified: Secondary | ICD-10-CM | POA: Diagnosis not present

## 2022-03-23 DIAGNOSIS — M6281 Muscle weakness (generalized): Secondary | ICD-10-CM

## 2022-03-23 NOTE — Therapy (Signed)
OUTPATIENT PHYSICAL THERAPY THORACOLUMBAR TREATMENT Patient Name: Joshua Ruiz MRN: [id number] DOB:16-Aug-1940, 82 y.o., male Today's Date: 03/23/2022  END OF SESSION:  PT End of Session - 03/23/22 1606     Visit Number 17    Date for PT Re-Evaluation 04/22/22    Authorization Type [employer]    Authorization Time Period 5/12    PT Start Time 1605    PT Stop Time 1652    PT Time Calculation (min) 47 min    Activity Tolerance Patient tolerated treatment well    Behavior During Therapy WFL for tasks assessed/performed             Past Medical History:  Diagnosis Date   GERD (gastroesophageal reflux disease)    Rheumatoid arthritis ([place])    Past Surgical History:  Procedure Laterality Date   EYE SURGERY Bilateral    cataract extraction   HEMORRHOID SURGERY     LAMINECTOMY WITH POSTERIOR LATERAL ARTHRODESIS LEVEL 3 N/A 11/20/2021   Procedure: LUMBAR TWO THROUGH LUMBAR THREE, LUMBAR THREE THROUGH LUMBAR FOUR, LUMBAR FOUR THROUGH LUMBAR FIVE LAMINECTOMY WITH FACETECTOMY, POSTERIOR SEGMENTAL INSTRUMENTATION, POSTEROLATERAL ARTHRODESIS;  Surgeon: Consuella Lose, MD;  Location: [facility];  Service: Neurosurgery;  Laterality: N/A;  3C   LYMPH NODE BIOPSY     Neck   TRIGGER FINGER RELEASE Right    3rd   Patient Active Problem List   Diagnosis Date Noted   Chronic rhinitis 02/15/2022   Eczema 02/15/2022   Spondylolisthesis of lumbar region 11/20/2021   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/2021   Lumbar spondylosis 10/20/2021   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-L5 level 10/20/2021   Primary osteoarthritis of left knee 08/14/2021   Chronic epididymitis 08/13/2020   Hives 07/11/2020   Rheumatoid arthritis ([place]) 07/11/2020   Osteoarthritis of left knee 07/11/2020   Cervical arthritis 07/11/2020   Gastroesophageal reflux disease 07/11/2020   Presbyopia of both eyes 04/20/2015   Pseudophakia of both eyes 04/20/2015    PCP: Gena Fray  REFERRING PROVIDER:  Kathyrn Sheriff  REFERRING DIAG: s/p lumbar fusion L2-5  Rationale for Evaluation and Treatment: Rehabilitation  THERAPY DIAG:  Muscle weakness (generalized)  Difficulty in walking, not elsewhere classified  Abnormal posture  Other low back pain  ONSET DATE: 11/20/21  SUBJECTIVE:                                                                                                                                                                                           SUBJECTIVE STATEMENT: Doing okay, knee still hurting.  No back pain right now PERTINENT HISTORY:  See above  PAIN:  Are you having pain? Yes: NPRS scale: 0/10 Pain  location: low back Pain description: tender, tight, ache Aggravating factors: touching it, standing, shopping, gets tired "very quick" 5 minutes standing, pain up to 6/10 Relieving factors: sit rest  PRECAUTIONS: Back  WEIGHT BEARING RESTRICTIONS: No  FALLS:  Has patient fallen in last 6 months? No  LIVING ENVIRONMENT: Lives with: lives alone Lives in: House/apartment Stairs: No Has following equipment at home: None  OCCUPATION: retired  PLOF: Independent does own housework some gym a few days a week  PATIENT GOALS: feel better, better motions, less difficulty standing, pick up things  NEXT MD VISIT:  January 3  OBJECTIVE:   DIAGNOSTIC FINDINGS:  healing  PATIENT SURVEYS:  FOTO 33  SCREENING FOR RED FLAGS: Bowel or bladder incontinence: No Spinal tumors: No Cauda equina syndrome: No Compression fracture: No Abdominal aneurysm: No  COGNITION: Overall cognitive status: Within functional limits for tasks assessed     SENSATION: Mild numbness in the left thigh  MUSCLE LENGTH: Hamstrings: Right 50 deg; Left 40 deg  POSTURE: decreased lumbar lordosis  PALPATION: Very tight and tender in the lumbar paraspinals and the mild tenderness in the buttocks  LUMBAR ROM: Following BLT rules he is very stiff especially with lateral  motions   LOWER EXTREMITY ROM:   other motions guarded but WFL's  Active  Right eval Left eval  Hip flexion    Hip extension    Hip abduction    Hip adduction    Hip internal rotation    Hip external rotation    Knee flexion    Knee extension  -15  Ankle dorsiflexion    Ankle plantarflexion    Ankle inversion    Ankle eversion     (Blank rows = not tested)  LOWER EXTREMITY MMT:    MMT Right eval Left eval  Hip flexion 4 4-  Hip extension    Hip abduction 4- 4-  Hip adduction    Hip internal rotation    Hip external rotation    Knee flexion 4- 4-  Knee extension 4- 4-  Ankle dorsiflexion 4 4  Ankle plantarflexion    Ankle inversion    Ankle eversion     (Blank rows = not tested)  FUNCTIONAL TESTS:  5 times sit to stand: 20 seconds Timed up and go (TUG): 17 3 minute walk test: stopped at 2 minutes 360 feet  GAIT: Distance walked: 100 feet Assistive device utilized: None Level of assistance: Complete Independence Comments: first few steps the left leg really gave way and he stumbled has trendelenberg on the left, on stairs he did one step at a time  TODAY'S TREATMENT:                                                                                                                              DATE:   03/23/22 Nustep level 5 x 6 minutes Gait outside larger lap around the back building, needed one rest due to fatigue, some knee  pain going downhill 10# straight arm pulls 2x15 35# seated row 2x15 35# lats 2x15 15# ARpress4 Feet on ball K2C, trunk rotation, small bridge, isometric ab Passive stretch to the HS and the piriformis   03/18/22 UBE standing L 3 2 min fwd/2 min back TM 5 min working on cadeance and stamina 1.2-1.5 mph Blue wt ball lean back onto BOSU sitting with chest press as he comes to back up to sitting, then 10 x with lean back and up into standing chest press ( PTA assist to stab and keep LE down with weak core tries to engage and use hip  flexors) Standing blue wt ball OH ext 10x ( limited ext and LB pulling) Standing wt ball trunk rotation 10 each Cable pulleys BIL hip 4 way 10 x each 10 # Cable pulley shld ext and rows with core engagement 15# [employer] 2 laps each hand 10# Leg Press 40# 2 sets 15. Calf raises 40# 2 sets 15 Black tband trunk flex and ext 20 x each     03/16/22 Nustep level 5 x 6 mintues side step on and off airex Gait around the back building fair pace, 2 rest breaks 10# straight arm pulls 2.5# standing hip abduction, marching and extension Facing wall hand slides up for posture  3# wate bar overhead carry On airex cone toe touches Doorway stretch Leg press 40# 2x10 Walking direction changes  03/11/22 Nustep L 6 7 min Marching with OH press 10 feet 2 x 4# Walking with shld flex 10 feet 2 x 4# Walking with shld abd 10 feet 2 x 4# STS with wt ball pass 10 x Ball tap on airex to work on upright posture and decreasing stiffness Posterior lean onto BOSU with wt ball press 10 x Dynamic pulley ex Fast walking with ball toss PROM LE and trunk  03/09/22 Nustep level 6 x 6 mintues 25# triceps 2x15 10# biceps 2x15 Seated row 25#  Lats 35# 3x10 Chest press 10# 3x10 LEg press 40# 3x10 LEg curls 25# 3x10 Leg extension 10# small ROM 3x10 PAssive stretch HS and piriformis Feet on ball small bridge and isometric abs   03/05/22 Nustep L 6 56mn Leg Press 40# 3 sets 10, feet 3 way Calf Raises 40# 2 sets 15 Lats 35# 2 sets15 Seated row 35#  2 sets 15 Black tband 20 x trunk flex and ext 25# triceps 2 sets 12 15# biceps 2 sets 12 20# upright row 2 sets 10 10# cable pulley AR press and circles 10 x 3 way each side  Feet on ball K2C. Trunk rotation, posterior isometric and abdominal isometrics PROM LE and trunk  PATIENT EDUCATION:  Education details: POC Person educated: Patient Education method: Explanation Education comprehension: verbalized understanding  HOME EXERCISE  PROGRAM: Reviewed HEP from the hospital  ASSESSMENT:  CLINICAL IMPRESSION: Patient continues to have c/o left knee pain, worse with walking down the slope, he did required rest break walking today and reported that was mostly due to fatigue.  He has a call into the MD and the MD has requested authorization to inject the knee, this is in limbo at this time.   OBJECTIVE IMPAIRMENTS: Abnormal gait, cardiopulmonary status limiting activity, decreased activity tolerance, decreased balance, decreased endurance, decreased mobility, difficulty walking, decreased ROM, decreased strength, increased muscle spasms, impaired flexibility, improper body mechanics, postural dysfunction, and pain.   REHAB POTENTIAL: Good  CLINICAL DECISION MAKING: Evolving/moderate complexity  EVALUATION COMPLEXITY: Low   GOALS: Goals reviewed with patient?  Yes  SHORT TERM GOALS: Target date: 01/10/22  Independent with initial HEP Goal status: met  LONG TERM GOALS: Target date: 03/31/22  Understand posture and body mechanics  Goal status: progressing 03/09/22 progressing 03/18/22  2.  Decrease pain 50% Goal status: met 03/09/22  3.  Increase lumbar ROM 25% Goal status: ongoing 02/11/22 and 02/18/22, WFLS but hold himslef guarded at times 03/05/22  4.  Walk 1000 feet  Goal status: progressing 02/18/22 with standing rest break 03/18/22 progressing  5.  Be able to put on shoes and socks without difficulty Goal status: progressing 02/23/22 MET 03/05/22   PLAN:  PT FREQUENCY: 1-2x/week  PT DURATION: 12 weeks  PLANNED INTERVENTIONS: Therapeutic exercises, Therapeutic activity, Neuromuscular re-education, Balance training, Gait training, Patient/Family education, Self Care, Joint mobilization, Electrical stimulation, Cryotherapy, Moist heat, and Manual therapy.  PLAN FOR NEXT SESSION:  continue to work on his posture, strength and overall function  Sumner Boast, PT 03/23/2022, 4:07 PM [facility] at [facility]. [place], [place], [place]

## 2022-03-25 ENCOUNTER — Ambulatory Visit: Payer: Medicare PPO | Admitting: Physical Therapy

## 2022-03-25 ENCOUNTER — Encounter: Payer: Self-pay | Admitting: Physical Therapy

## 2022-03-25 DIAGNOSIS — M6281 Muscle weakness (generalized): Secondary | ICD-10-CM

## 2022-03-25 DIAGNOSIS — R262 Difficulty in walking, not elsewhere classified: Secondary | ICD-10-CM | POA: Diagnosis not present

## 2022-03-25 DIAGNOSIS — M5459 Other low back pain: Secondary | ICD-10-CM

## 2022-03-25 DIAGNOSIS — R293 Abnormal posture: Secondary | ICD-10-CM | POA: Diagnosis not present

## 2022-03-25 NOTE — Therapy (Signed)
OUTPATIENT PHYSICAL THERAPY THORACOLUMBAR TREATMENT Patient Name: Joshua Ruiz MRN: [id number] DOB:10-22-1940, 82 y.o., male Today's Date: 03/25/2022  END OF SESSION:  PT End of Session - 03/25/22 1543     Visit Number 18    Date for PT Re-Evaluation 04/22/22    Authorization Type [employer]    Authorization Time Period 6/12    PT Start Time 1525    PT Stop Time 1611    PT Time Calculation (min) 46 min    Activity Tolerance Patient tolerated treatment well    Behavior During Therapy WFL for tasks assessed/performed             Past Medical History:  Diagnosis Date   GERD (gastroesophageal reflux disease)    Rheumatoid arthritis ([place])    Past Surgical History:  Procedure Laterality Date   EYE SURGERY Bilateral    cataract extraction   HEMORRHOID SURGERY     LAMINECTOMY WITH POSTERIOR LATERAL ARTHRODESIS LEVEL 3 N/A 11/20/2021   Procedure: LUMBAR TWO THROUGH LUMBAR THREE, LUMBAR THREE THROUGH LUMBAR FOUR, LUMBAR FOUR THROUGH LUMBAR FIVE LAMINECTOMY WITH FACETECTOMY, POSTERIOR SEGMENTAL INSTRUMENTATION, POSTEROLATERAL ARTHRODESIS;  Surgeon: Consuella Lose, MD;  Location: [facility];  Service: Neurosurgery;  Laterality: N/A;  3C   LYMPH NODE BIOPSY     Neck   TRIGGER FINGER RELEASE Right    3rd   Patient Active Problem List   Diagnosis Date Noted   Chronic rhinitis 02/15/2022   Eczema 02/15/2022   Spondylolisthesis of lumbar region 11/20/2021   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/2021   Lumbar spondylosis 10/20/2021   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-L5 level 10/20/2021   Primary osteoarthritis of left knee 08/14/2021   Chronic epididymitis 08/13/2020   Hives 07/11/2020   Rheumatoid arthritis ([place]) 07/11/2020   Osteoarthritis of left knee 07/11/2020   Cervical arthritis 07/11/2020   Gastroesophageal reflux disease 07/11/2020   Presbyopia of both eyes 04/20/2015   Pseudophakia of both eyes 04/20/2015    PCP: Gena Fray  REFERRING PROVIDER:  Kathyrn Sheriff  REFERRING DIAG: s/p lumbar fusion L2-5  Rationale for Evaluation and Treatment: Rehabilitation  THERAPY DIAG:  Muscle weakness (generalized)  Difficulty in walking, not elsewhere classified  Abnormal posture  Other low back pain  ONSET DATE: 11/20/21  SUBJECTIVE:                                                                                                                                                                                           SUBJECTIVE STATEMENT: Reports that he lifted a 28# box of cat litter, he reports that he had some back pain last night and is a little sore PERTINENT HISTORY:  See above  PAIN:  Are you having pain? Yes: NPRS scale: 3/10 Pain location: low back Pain description: tender, tight, ache Aggravating factors: touching it, standing, shopping, gets tired "very quick" 5 minutes standing, pain up to 6/10 Relieving factors: sit rest  PRECAUTIONS: Back  WEIGHT BEARING RESTRICTIONS: No  FALLS:  Has patient fallen in last 6 months? No  LIVING ENVIRONMENT: Lives with: lives alone Lives in: House/apartment Stairs: No Has following equipment at home: None  OCCUPATION: retired  PLOF: Independent does own housework some gym a few days a week  PATIENT GOALS: feel better, better motions, less difficulty standing, pick up things  NEXT MD VISIT:  January 3  OBJECTIVE:   DIAGNOSTIC FINDINGS:  healing  PATIENT SURVEYS:  FOTO 33  SCREENING FOR RED FLAGS: Bowel or bladder incontinence: No Spinal tumors: No Cauda equina syndrome: No Compression fracture: No Abdominal aneurysm: No  COGNITION: Overall cognitive status: Within functional limits for tasks assessed     SENSATION: Mild numbness in the left thigh  MUSCLE LENGTH: Hamstrings: Right 50 deg; Left 40 deg  POSTURE: decreased lumbar lordosis  PALPATION: Very tight and tender in the lumbar paraspinals and the mild tenderness in the buttocks  LUMBAR ROM:  Following BLT rules he is very stiff especially with lateral motions   LOWER EXTREMITY ROM:   other motions guarded but WFL's  Active  Right eval Left eval  Hip flexion    Hip extension    Hip abduction    Hip adduction    Hip internal rotation    Hip external rotation    Knee flexion    Knee extension  -15  Ankle dorsiflexion    Ankle plantarflexion    Ankle inversion    Ankle eversion     (Blank rows = not tested)  LOWER EXTREMITY MMT:    MMT Right eval Left eval  Hip flexion 4 4-  Hip extension    Hip abduction 4- 4-  Hip adduction    Hip internal rotation    Hip external rotation    Knee flexion 4- 4-  Knee extension 4- 4-  Ankle dorsiflexion 4 4  Ankle plantarflexion    Ankle inversion    Ankle eversion     (Blank rows = not tested)  FUNCTIONAL TESTS:  5 times sit to stand: 20 seconds Timed up and go (TUG): 17 3 minute walk test: stopped at 2 minutes 360 feet  GAIT: Distance walked: 100 feet Assistive device utilized: None Level of assistance: Complete Independence Comments: first few steps the left leg really gave way and he stumbled has trendelenberg on the left, on stairs he did one step at a time  TODAY'S TREATMENT:                                                                                                                              DATE:   03/25/22 Gait around the back building fair pace no  rest today 10# straight arm pulls on and off airex 35# seated row and lats 3x10 Education on posture and body mechanics, golfers lift, foot prop, mopping, vacuum, weight close, bend knees back straight and pivot all demo by PT and patient verbalized understanding Feet on ball K2C, trunk rotation, small bridge and ispometric abs Passive HS and piriformis stretch  03/23/22 Nustep level 5 x 6 minutes Gait outside larger lap around the back building, needed one rest due to fatigue, some knee pain going downhill 10# straight arm pulls 2x15 35# seated row  2x15 35# lats 2x15 15# [email or web address] Feet on ball K2C, trunk rotation, small bridge, isometric ab Passive stretch to the HS and the piriformis   03/18/22 UBE standing L 3 2 min fwd/2 min back TM 5 min working on cadeance and stamina 1.2-1.5 mph Blue wt ball lean back onto BOSU sitting with chest press as he comes to back up to sitting, then 10 x with lean back and up into standing chest press ( PTA assist to stab and keep LE down with weak core tries to engage and use hip flexors) Standing blue wt ball OH ext 10x ( limited ext and LB pulling) Standing wt ball trunk rotation 10 each Cable pulleys BIL hip 4 way 10 x each 10 # Cable pulley shld ext and rows with core engagement 15# [employer] 2 laps each hand 10# Leg Press 40# 2 sets 15. Calf raises 40# 2 sets 15 Black tband trunk flex and ext 20 x each     03/16/22 Nustep level 5 x 6 mintues side step on and off airex Gait around the back building fair pace, 2 rest breaks 10# straight arm pulls 2.5# standing hip abduction, marching and extension Facing wall hand slides up for posture  3# wate bar overhead carry On airex cone toe touches Doorway stretch Leg press 40# 2x10 Walking direction changes  03/11/22 Nustep L 6 7 min Marching with OH press 10 feet 2 x 4# Walking with shld flex 10 feet 2 x 4# Walking with shld abd 10 feet 2 x 4# STS with wt ball pass 10 x Ball tap on airex to work on upright posture and decreasing stiffness Posterior lean onto BOSU with wt ball press 10 x Dynamic pulley ex Fast walking with ball toss PROM LE and trunk  03/09/22 Nustep level 6 x 6 mintues 25# triceps 2x15 10# biceps 2x15 Seated row 25#  Lats 35# 3x10 Chest press 10# 3x10 LEg press 40# 3x10 LEg curls 25# 3x10 Leg extension 10# small ROM 3x10 PAssive stretch HS and piriformis Feet on ball small bridge and isometric abs   PATIENT EDUCATION:  Education details: POC Person educated: Patient Education method: Explanation Education  comprehension: verbalized understanding  HOME EXERCISE PROGRAM: Reviewed HEP from the hospital  ASSESSMENT:  CLINICAL IMPRESSION: Patient with a little more pain today after lifting cat litter yesterday, we did a lot of discussion on posture and body mechanics and demonstration.  He asked good questions and had no increase of pain after our session OBJECTIVE IMPAIRMENTS: Abnormal gait, cardiopulmonary status limiting activity, decreased activity tolerance, decreased balance, decreased endurance, decreased mobility, difficulty walking, decreased ROM, decreased strength, increased muscle spasms, impaired flexibility, improper body mechanics, postural dysfunction, and pain.   REHAB POTENTIAL: Good  CLINICAL DECISION MAKING: Evolving/moderate complexity  EVALUATION COMPLEXITY: Low   GOALS: Goals reviewed with patient? Yes  SHORT TERM GOALS: Target date: 01/10/22  Independent with initial HEP Goal status: met  LONG  TERM GOALS: Target date: 03/31/22  Understand posture and body mechanics  Goal status: progressing 03/09/22 progressing 03/18/22  2.  Decrease pain 50% Goal status: met 03/09/22  3.  Increase lumbar ROM 25% Goal status: ongoing 02/11/22 and 02/18/22, WFLS but hold himslef guarded at times 03/05/22  4.  Walk 1000 feet  Goal status: progressing 02/18/22 with standing rest break 03/18/22 progressing  5.  Be able to put on shoes and socks without difficulty Goal status: progressing 02/23/22 MET 03/05/22   PLAN:  PT FREQUENCY: 1-2x/week  PT DURATION: 12 weeks  PLANNED INTERVENTIONS: Therapeutic exercises, Therapeutic activity, Neuromuscular re-education, Balance training, Gait training, Patient/Family education, Self Care, Joint mobilization, Electrical stimulation, Cryotherapy, Moist heat, and Manual therapy.  PLAN FOR NEXT SESSION:  continue to work on his posture, strength and overall function  Sumner Boast, PT 03/25/2022, 3:44 PM [facility]. [place], [place], [place]

## 2022-03-30 ENCOUNTER — Encounter: Payer: Self-pay | Admitting: Physical Therapy

## 2022-03-30 ENCOUNTER — Ambulatory Visit: Payer: Medicare PPO | Admitting: Physical Therapy

## 2022-03-30 DIAGNOSIS — M5459 Other low back pain: Secondary | ICD-10-CM | POA: Diagnosis not present

## 2022-03-30 DIAGNOSIS — M6281 Muscle weakness (generalized): Secondary | ICD-10-CM | POA: Diagnosis not present

## 2022-03-30 DIAGNOSIS — R262 Difficulty in walking, not elsewhere classified: Secondary | ICD-10-CM | POA: Diagnosis not present

## 2022-03-30 DIAGNOSIS — R293 Abnormal posture: Secondary | ICD-10-CM

## 2022-03-30 NOTE — Therapy (Signed)
OUTPATIENT PHYSICAL THERAPY THORACOLUMBAR TREATMENT Patient Name: Joshua Ruiz MRN: [id number] DOB:1940-11-26, 82 y.o., male Today's Date: 03/30/2022  END OF SESSION:  PT End of Session - 03/30/22 1615     Visit Number 19    Date for PT Re-Evaluation 04/22/22    Authorization Type [employer]    Authorization Time Period 7/12    PT Start Time 1610    PT Stop Time 1656    PT Time Calculation (min) 46 min    Activity Tolerance Patient tolerated treatment well    Behavior During Therapy WFL for tasks assessed/performed             Past Medical History:  Diagnosis Date   GERD (gastroesophageal reflux disease)    Rheumatoid arthritis ([place])    Past Surgical History:  Procedure Laterality Date   EYE SURGERY Bilateral    cataract extraction   HEMORRHOID SURGERY     LAMINECTOMY WITH POSTERIOR LATERAL ARTHRODESIS LEVEL 3 N/A 11/20/2021   Procedure: LUMBAR TWO THROUGH LUMBAR THREE, LUMBAR THREE THROUGH LUMBAR FOUR, LUMBAR FOUR THROUGH LUMBAR FIVE LAMINECTOMY WITH FACETECTOMY, POSTERIOR SEGMENTAL INSTRUMENTATION, POSTEROLATERAL ARTHRODESIS;  Surgeon: Consuella Lose, MD;  Location: [facility];  Service: Neurosurgery;  Laterality: N/A;  3C   LYMPH NODE BIOPSY     Neck   TRIGGER FINGER RELEASE Right    3rd   Patient Active Problem List   Diagnosis Date Noted   Chronic rhinitis 02/15/2022   Eczema 02/15/2022   Spondylolisthesis of lumbar region 11/20/2021   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/2021   Lumbar spondylosis 10/20/2021   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-L5 level 10/20/2021   Primary osteoarthritis of left knee 08/14/2021   Chronic epididymitis 08/13/2020   Hives 07/11/2020   Rheumatoid arthritis ([place]) 07/11/2020   Osteoarthritis of left knee 07/11/2020   Cervical arthritis 07/11/2020   Gastroesophageal reflux disease 07/11/2020   Presbyopia of both eyes 04/20/2015   Pseudophakia of both eyes 04/20/2015    PCP: Gena Fray  REFERRING PROVIDER:  Kathyrn Sheriff  REFERRING DIAG: s/p lumbar fusion L2-5  Rationale for Evaluation and Treatment: Rehabilitation  THERAPY DIAG:  Muscle weakness (generalized)  Difficulty in walking, not elsewhere classified  Abnormal posture  Other low back pain  ONSET DATE: 11/20/21  SUBJECTIVE:                                                                                                                                                                                           SUBJECTIVE STATEMENT: Reports that he moved the cat litter again and reports that he did well and did not have pain doing some of the mechanics PERTINENT HISTORY:  See  above  PAIN:  Are you having pain? Yes: NPRS scale: 1/10 Pain location: low back Pain description: tender, tight, ache Aggravating factors: touching it, standing, shopping, gets tired "very quick" 5 minutes standing, pain up to 6/10 Relieving factors: sit rest  PRECAUTIONS: Back  WEIGHT BEARING RESTRICTIONS: No  FALLS:  Has patient fallen in last 6 months? No  LIVING ENVIRONMENT: Lives with: lives alone Lives in: House/apartment Stairs: No Has following equipment at home: None  OCCUPATION: retired  PLOF: Independent does own housework some gym a few days a week  PATIENT GOALS: feel better, better motions, less difficulty standing, pick up things  NEXT MD VISIT:  January 3  OBJECTIVE:   DIAGNOSTIC FINDINGS:  healing  PATIENT SURVEYS:  FOTO 33  SCREENING FOR RED FLAGS: Bowel or bladder incontinence: No Spinal tumors: No Cauda equina syndrome: No Compression fracture: No Abdominal aneurysm: No  COGNITION: Overall cognitive status: Within functional limits for tasks assessed     SENSATION: Mild numbness in the left thigh  MUSCLE LENGTH: Hamstrings: Right 50 deg; Left 40 deg  POSTURE: decreased lumbar lordosis  PALPATION: Very tight and tender in the lumbar paraspinals and the mild tenderness in the buttocks  LUMBAR ROM:  Following BLT rules he is very stiff especially with lateral motions   LOWER EXTREMITY ROM:   other motions guarded but WFL's  Active  Right eval Left eval  Hip flexion    Hip extension    Hip abduction    Hip adduction    Hip internal rotation    Hip external rotation    Knee flexion    Knee extension  -15  Ankle dorsiflexion    Ankle plantarflexion    Ankle inversion    Ankle eversion     (Blank rows = not tested)  LOWER EXTREMITY MMT:    MMT Right eval Left eval  Hip flexion 4 4-  Hip extension    Hip abduction 4- 4-  Hip adduction    Hip internal rotation    Hip external rotation    Knee flexion 4- 4-  Knee extension 4- 4-  Ankle dorsiflexion 4 4  Ankle plantarflexion    Ankle inversion    Ankle eversion     (Blank rows = not tested)  FUNCTIONAL TESTS:  5 times sit to stand: 20 seconds Timed up and go (TUG): 17 3 minute walk test: stopped at 2 minutes 360 feet  GAIT: Distance walked: 100 feet Assistive device utilized: None Level of assistance: Complete Independence Comments: first few steps the left leg really gave way and he stumbled has trendelenberg on the left, on stairs he did one step at a time  TODAY'S TREATMENT:                                                                                                                              DATE:   03/30/22 Nustep level 5 x 6 minutes Tmill 5 minutes  cues for step length and for posture 1.5 mph x 3 mintues and then down to 1.3 x 2 minutes On airex straight arm pulls 10# 2x15 35# rows 2x10 35# lats 2x10 Black tband lumbar extension Chest press 10# Black tband abs 40# resisted gait all motions Feet on ball K2C trunk rotation, small bridge and isometric abs HS stretches   03/25/22 Gait around the back building fair pace no rest today 10# straight arm pulls on and off airex 35# seated row and lats 3x10 Education on posture and body mechanics, golfers lift, foot prop, mopping, vacuum, weight  close, bend knees back straight and pivot all demo by PT and patient verbalized understanding Feet on ball K2C, trunk rotation, small bridge and ispometric abs Passive HS and piriformis stretch  03/23/22 Nustep level 5 x 6 minutes Gait outside larger lap around the back building, needed one rest due to fatigue, some knee pain going downhill 10# straight arm pulls 2x15 35# seated row 2x15 35# lats 2x15 15# [email or web address] Feet on ball K2C, trunk rotation, small bridge, isometric ab Passive stretch to the HS and the piriformis   03/18/22 UBE standing L 3 2 min fwd/2 min back TM 5 min working on cadeance and stamina 1.2-1.5 mph Blue wt ball lean back onto BOSU sitting with chest press as he comes to back up to sitting, then 10 x with lean back and up into standing chest press ( PTA assist to stab and keep LE down with weak core tries to engage and use hip flexors) Standing blue wt ball OH ext 10x ( limited ext and LB pulling) Standing wt ball trunk rotation 10 each Cable pulleys BIL hip 4 way 10 x each 10 # Cable pulley shld ext and rows with core engagement 15# [employer] 2 laps each hand 10# Leg Press 40# 2 sets 15. Calf raises 40# 2 sets 15 Black tband trunk flex and ext 20 x each     03/16/22 Nustep level 5 x 6 mintues side step on and off airex Gait around the back building fair pace, 2 rest breaks 10# straight arm pulls 2.5# standing hip abduction, marching and extension Facing wall hand slides up for posture  3# wate bar overhead carry On airex cone toe touches Doorway stretch Leg press 40# 2x10 Walking direction changes  03/11/22 Nustep L 6 7 min Marching with OH press 10 feet 2 x 4# Walking with shld flex 10 feet 2 x 4# Walking with shld abd 10 feet 2 x 4# STS with wt ball pass 10 x Ball tap on airex to work on upright posture and decreasing stiffness Posterior lean onto BOSU with wt ball press 10 x Dynamic pulley ex Fast walking with ball toss PROM LE and  trunk  03/09/22 Nustep level 6 x 6 mintues 25# triceps 2x15 10# biceps 2x15 Seated row 25#  Lats 35# 3x10 Chest press 10# 3x10 LEg press 40# 3x10 LEg curls 25# 3x10 Leg extension 10# small ROM 3x10 PAssive stretch HS and piriformis Feet on ball small bridge and isometric abs   PATIENT EDUCATION:  Education details: POC Person educated: Patient Education method: Explanation Education comprehension: verbalized understanding  HOME EXERCISE PROGRAM: Reviewed HEP from the hospital  ASSESSMENT:  CLINICAL IMPRESSION: PAtient reports that he had to lift the cat litter again and did the correct body mechanics, reports no pain today, he did have some difficulty on the tmill with a smaller step on the left.  Still needing cues for posture  as he tends to round shoulders and forward head, has difficulty correcting OBJECTIVE IMPAIRMENTS: Abnormal gait, cardiopulmonary status limiting activity, decreased activity tolerance, decreased balance, decreased endurance, decreased mobility, difficulty walking, decreased ROM, decreased strength, increased muscle spasms, impaired flexibility, improper body mechanics, postural dysfunction, and pain.   REHAB POTENTIAL: Good  CLINICAL DECISION MAKING: Evolving/moderate complexity  EVALUATION COMPLEXITY: Low   GOALS: Goals reviewed with patient? Yes  SHORT TERM GOALS: Target date: 01/10/22  Independent with initial HEP Goal status: met  LONG TERM GOALS: Target date: 03/31/22  Understand posture and body mechanics  Goal status: progressing 03/09/22 progressing 03/18/22  2.  Decrease pain 50% Goal status: met 03/09/22  3.  Increase lumbar ROM 25% Goal status: ongoing 02/11/22 and 02/18/22, WFLS but hold himslef guarded at times 03/05/22  4.  Walk 1000 feet  Goal status: progressing 02/18/22 with standing rest break 03/18/22 progressing  5.  Be able to put on shoes and socks without difficulty Goal status: progressing 02/23/22 MET  03/05/22   PLAN:  PT FREQUENCY: 1-2x/week  PT DURATION: 12 weeks  PLANNED INTERVENTIONS: Therapeutic exercises, Therapeutic activity, Neuromuscular re-education, Balance training, Gait training, Patient/Family education, Self Care, Joint mobilization, Electrical stimulation, Cryotherapy, Moist heat, and Manual therapy.  PLAN FOR NEXT SESSION:  continue to work on his posture, strength and overall function  Sumner Boast, PT 03/30/2022, 4:17 PM [facility] [place]. [place], [place], [place]

## 2022-04-01 ENCOUNTER — Encounter: Payer: Self-pay | Admitting: Physical Therapy

## 2022-04-01 ENCOUNTER — Ambulatory Visit: Payer: Medicare PPO | Admitting: Physical Therapy

## 2022-04-01 DIAGNOSIS — M5459 Other low back pain: Secondary | ICD-10-CM

## 2022-04-01 DIAGNOSIS — R293 Abnormal posture: Secondary | ICD-10-CM | POA: Diagnosis not present

## 2022-04-01 DIAGNOSIS — R262 Difficulty in walking, not elsewhere classified: Secondary | ICD-10-CM

## 2022-04-01 DIAGNOSIS — M6281 Muscle weakness (generalized): Secondary | ICD-10-CM

## 2022-04-01 NOTE — Therapy (Signed)
OUTPATIENT PHYSICAL THERAPY THORACOLUMBAR TREATMENT Progress Note Reporting Period 02/25/22 to 04/01/22 for visits 11-20  See note below for Objective Data and Assessment of Progress/Goals.     Patient Name: Joshua Ruiz MRN: [id number] DOB:[birth year], 82 y.o., male Today's Date: 04/01/2022  END OF SESSION:  PT End of Session - 04/01/22 1445     Visit Number 20    Date for PT Re-Evaluation 04/22/22    Authorization Type [employer]    Authorization Time Period 8/12    PT Start Time 1443    PT Stop Time 1529    PT Time Calculation (min) 46 min    Activity Tolerance Patient tolerated treatment well    Behavior During Therapy WFL for tasks assessed/performed             Past Medical History:  Diagnosis Date   GERD (gastroesophageal reflux disease)    Rheumatoid arthritis ([place])    Past Surgical History:  Procedure Laterality Date   EYE SURGERY Bilateral    cataract extraction   HEMORRHOID SURGERY     LAMINECTOMY WITH POSTERIOR LATERAL ARTHRODESIS LEVEL 3 N/A 11/20/2021   Procedure: LUMBAR TWO THROUGH LUMBAR THREE, LUMBAR THREE THROUGH LUMBAR FOUR, LUMBAR FOUR THROUGH LUMBAR FIVE LAMINECTOMY WITH FACETECTOMY, POSTERIOR SEGMENTAL INSTRUMENTATION, POSTEROLATERAL ARTHRODESIS;  Surgeon: Consuella Lose, MD;  Location: [facility];  Service: Neurosurgery;  Laterality: N/A;  3C   LYMPH NODE BIOPSY     Neck   TRIGGER FINGER RELEASE Right    3rd   Patient Active Problem List   Diagnosis Date Noted   Chronic rhinitis 02/15/2022   Eczema 02/15/2022   Spondylolisthesis of lumbar region 11/20/2021   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/2021   Lumbar spondylosis 10/20/2021   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-L5 level 10/20/2021   Primary osteoarthritis of left knee 08/14/2021   Chronic epididymitis 08/13/2020   Hives 07/11/2020   Rheumatoid arthritis ([place]) 07/11/2020   Osteoarthritis of left knee 07/11/2020   Cervical arthritis 07/11/2020   Gastroesophageal reflux disease  07/11/2020   Presbyopia of both eyes 04/20/2015   Pseudophakia of both eyes 04/20/2015    PCP: Gena Fray  REFERRING PROVIDER: Kathyrn Sheriff  REFERRING DIAG: s/p lumbar fusion L2-5  Rationale for Evaluation and Treatment: Rehabilitation  THERAPY DIAG:  Muscle weakness (generalized)  Difficulty in walking, not elsewhere classified  Abnormal posture  Other low back pain  ONSET DATE: 11/20/21  SUBJECTIVE:                                                                                                                                                                                           SUBJECTIVE STATEMENT: Feeling pretty good,  no pain right now, just fatigue with activity PERTINENT HISTORY:  See above  PAIN:  Are you having pain? Yes: NPRS scale: 1/10 Pain location: low back Pain description: tender, tight, ache Aggravating factors: touching it, standing, shopping, gets tired "very quick" 5 minutes standing, pain up to 6/10 Relieving factors: sit rest  PRECAUTIONS: Back  WEIGHT BEARING RESTRICTIONS: No  FALLS:  Has patient fallen in last 6 months? No  LIVING ENVIRONMENT: Lives with: lives alone Lives in: House/apartment Stairs: No Has following equipment at home: None  OCCUPATION: retired  PLOF: Independent does own housework some gym a few days a week  PATIENT GOALS: feel better, better motions, less difficulty standing, pick up things  NEXT MD VISIT:  January 3  OBJECTIVE:   DIAGNOSTIC FINDINGS:  healing  PATIENT SURVEYS:  FOTO 33  SCREENING FOR RED FLAGS: Bowel or bladder incontinence: No Spinal tumors: No Cauda equina syndrome: No Compression fracture: No Abdominal aneurysm: No  COGNITION: Overall cognitive status: Within functional limits for tasks assessed     SENSATION: Mild numbness in the left thigh  MUSCLE LENGTH: Hamstrings: Right 50 deg; Left 40 deg  POSTURE: decreased lumbar lordosis  PALPATION: Very tight and tender in the  lumbar paraspinals and the mild tenderness in the buttocks  LUMBAR ROM: Following BLT rules he is very stiff especially with lateral motions   LOWER EXTREMITY ROM:   other motions guarded but WFL's  Active  Right eval Left eval  Hip flexion    Hip extension    Hip abduction    Hip adduction    Hip internal rotation    Hip external rotation    Knee flexion    Knee extension  -15  Ankle dorsiflexion    Ankle plantarflexion    Ankle inversion    Ankle eversion     (Blank rows = not tested)  LOWER EXTREMITY MMT:    MMT Right eval Left eval  Hip flexion 4 4-  Hip extension    Hip abduction 4- 4-  Hip adduction    Hip internal rotation    Hip external rotation    Knee flexion 4- 4-  Knee extension 4- 4-  Ankle dorsiflexion 4 4  Ankle plantarflexion    Ankle inversion    Ankle eversion     (Blank rows = not tested)  FUNCTIONAL TESTS:  5 times sit to stand: 20 seconds Timed up and go (TUG): 17 3 minute walk test: stopped at 2 minutes 360 feet  GAIT: Distance walked: 100 feet Assistive device utilized: None Level of assistance: Complete Independence Comments: first few steps the left leg really gave way and he stumbled has trendelenberg on the left, on stairs he did one step at a time  TODAY'S TREATMENT:                                                                                                                              DATE:  04/01/22 Nustep level 5 x 5 minutes Calf stretches Gait around the building, needed rest break due to back pain and SOB On airex 10# straight arm pulls 35# rows 15# AR press 35# lat pulls Black tband lumbar extension Black tband abs On airex cone toe touches CGA Side step on and off airex Arm wall slides for postural motions and mms Green tband horizontal abduction  03/30/22 Nustep level 5 x 6 minutes Tmill 5 minutes cues for step length and for posture 1.5 mph x 3 mintues and then down to 1.3 x 2 minutes On airex straight  arm pulls 10# 2x15 35# rows 2x10 35# lats 2x10 Black tband lumbar extension Chest press 10# Black tband abs 40# resisted gait all motions Feet on ball K2C trunk rotation, small bridge and isometric abs HS stretches   03/25/22 Gait around the back building fair pace no rest today 10# straight arm pulls on and off airex 35# seated row and lats 3x10 Education on posture and body mechanics, golfers lift, foot prop, mopping, vacuum, weight close, bend knees back straight and pivot all demo by PT and patient verbalized understanding Feet on ball K2C, trunk rotation, small bridge and ispometric abs Passive HS and piriformis stretch  03/23/22 Nustep level 5 x 6 minutes Gait outside larger lap around the back building, needed one rest due to fatigue, some knee pain going downhill 10# straight arm pulls 2x15 35# seated row 2x15 35# lats 2x15 15# [email or web address] Feet on ball K2C, trunk rotation, small bridge, isometric ab Passive stretch to the HS and the piriformis   03/18/22 UBE standing L 3 2 min fwd/2 min back TM 5 min working on cadeance and stamina 1.2-1.5 mph Blue wt ball lean back onto BOSU sitting with chest press as he comes to back up to sitting, then 10 x with lean back and up into standing chest press ( PTA assist to stab and keep LE down with weak core tries to engage and use hip flexors) Standing blue wt ball OH ext 10x ( limited ext and LB pulling) Standing wt ball trunk rotation 10 each Cable pulleys BIL hip 4 way 10 x each 10 # Cable pulley shld ext and rows with core engagement 15# [employer] 2 laps each hand 10# Leg Press 40# 2 sets 15. Calf raises 40# 2 sets 15 Black tband trunk flex and ext 20 x each     03/16/22 Nustep level 5 x 6 mintues side step on and off airex Gait around the back building fair pace, 2 rest breaks 10# straight arm pulls 2.5# standing hip abduction, marching and extension Facing wall hand slides up for posture  3# wate bar overhead carry On  airex cone toe touches Doorway stretch Leg press 40# 2x10 Walking direction changes  03/11/22 Nustep L 6 7 min Marching with OH press 10 feet 2 x 4# Walking with shld flex 10 feet 2 x 4# Walking with shld abd 10 feet 2 x 4# STS with wt ball pass 10 x Ball tap on airex to work on upright posture and decreasing stiffness Posterior lean onto BOSU with wt ball press 10 x Dynamic pulley ex Fast walking with ball toss PROM LE and trunk  03/09/22 Nustep level 6 x 6 mintues 25# triceps 2x15 10# biceps 2x15 Seated row 25#  Lats 35# 3x10 Chest press 10# 3x10 LEg press 40# 3x10 LEg curls 25# 3x10 Leg extension 10# small ROM 3x10 PAssive stretch HS and piriformis Feet on ball small  bridge and isometric abs   PATIENT EDUCATION:  Education details: POC Person educated: Patient Education method: Explanation Education comprehension: verbalized understanding  HOME EXERCISE PROGRAM: Reviewed HEP from the hospital  ASSESSMENT:  CLINICAL IMPRESSION: PAtient feels he is doing better, he does have some c/o knee and shoulder pain and neck stiffness, he still has fatigue with walking.  He is stiff all over.  Needs a lot of cues for postural positioning OBJECTIVE IMPAIRMENTS: Abnormal gait, cardiopulmonary status limiting activity, decreased activity tolerance, decreased balance, decreased endurance, decreased mobility, difficulty walking, decreased ROM, decreased strength, increased muscle spasms, impaired flexibility, improper body mechanics, postural dysfunction, and pain.   REHAB POTENTIAL: Good  CLINICAL DECISION MAKING: Evolving/moderate complexity  EVALUATION COMPLEXITY: Low   GOALS: Goals reviewed with patient? Yes  SHORT TERM GOALS: Target date: 01/10/22  Independent with initial HEP Goal status: met  LONG TERM GOALS: Target date: 03/31/22  Understand posture and body mechanics  Goal status: progressing 03/09/22 progressing 03/18/22  2.  Decrease pain 50% Goal status: met  03/09/22  3.  Increase lumbar ROM 25% Goal status: ongoing 02/11/22 and 02/18/22, WFLS but hold himslef guarded at times 03/05/22  4.  Walk 1000 feet  Goal status: progressing 02/18/22 with standing rest break 03/18/22 progressing  5.  Be able to put on shoes and socks without difficulty Goal status: progressing 02/23/22 MET 03/05/22   PLAN:  PT FREQUENCY: 1-2x/week  PT DURATION: 12 weeks  PLANNED INTERVENTIONS: Therapeutic exercises, Therapeutic activity, Neuromuscular re-education, Balance training, Gait training, Patient/Family education, Self Care, Joint mobilization, Electrical stimulation, Cryotherapy, Moist heat, and Manual therapy.  PLAN FOR NEXT SESSION:  continue to work on his posture, strength and overall function  Sumner Boast, PT 04/01/2022, 2:47 PM [facility]. [place], [place], [place]

## 2022-04-08 DIAGNOSIS — J309 Allergic rhinitis, unspecified: Secondary | ICD-10-CM | POA: Diagnosis not present

## 2022-04-15 ENCOUNTER — Ambulatory Visit: Payer: Medicare PPO | Attending: Neurosurgery | Admitting: Physical Therapy

## 2022-04-15 DIAGNOSIS — R293 Abnormal posture: Secondary | ICD-10-CM | POA: Insufficient documentation

## 2022-04-15 DIAGNOSIS — M6281 Muscle weakness (generalized): Secondary | ICD-10-CM | POA: Diagnosis not present

## 2022-04-15 DIAGNOSIS — M5459 Other low back pain: Secondary | ICD-10-CM | POA: Insufficient documentation

## 2022-04-15 DIAGNOSIS — R262 Difficulty in walking, not elsewhere classified: Secondary | ICD-10-CM | POA: Diagnosis not present

## 2022-04-15 NOTE — Therapy (Signed)
OUTPATIENT PHYSICAL THERAPY THORACOLUMBAR TREATMENT      Patient Name: Joshua Ruiz MRN: [id number] DOB:[birth year], 82 y.o., male Today's Date: 04/15/2022  END OF SESSION:  PT End of Session - 04/15/22 1610     Visit Number 21    Date for PT Re-Evaluation 04/22/22    Authorization Type [employer]    PT Start Time 1615    PT Stop Time 1700    PT Time Calculation (min) 45 min             Past Medical History:  Diagnosis Date   GERD (gastroesophageal reflux disease)    Rheumatoid arthritis ([place])    Past Surgical History:  Procedure Laterality Date   EYE SURGERY Bilateral    cataract extraction   HEMORRHOID SURGERY     LAMINECTOMY WITH POSTERIOR LATERAL ARTHRODESIS LEVEL 3 N/A 11/20/2021   Procedure: LUMBAR TWO THROUGH LUMBAR THREE, LUMBAR THREE THROUGH LUMBAR FOUR, LUMBAR FOUR THROUGH LUMBAR FIVE LAMINECTOMY WITH FACETECTOMY, POSTERIOR SEGMENTAL INSTRUMENTATION, POSTEROLATERAL ARTHRODESIS;  Surgeon: Consuella Lose, MD;  Location: [facility];  Service: Neurosurgery;  Laterality: N/A;  3C   LYMPH NODE BIOPSY     Neck   TRIGGER FINGER RELEASE Right    3rd   Patient Active Problem List   Diagnosis Date Noted   Chronic rhinitis 02/15/2022   Eczema 02/15/2022   Spondylolisthesis of lumbar region 11/20/2021   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/2021   Lumbar spondylosis 10/20/2021   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-L5 level 10/20/2021   Primary osteoarthritis of left knee 08/14/2021   Chronic epididymitis 08/13/2020   Hives 07/11/2020   Rheumatoid arthritis 07/11/2020   Osteoarthritis of left knee 07/11/2020   Cervical arthritis 07/11/2020   Gastroesophageal reflux disease 07/11/2020   Presbyopia of both eyes 04/20/2015   Pseudophakia of both eyes 04/20/2015    PCP: Gena Fray  REFERRING PROVIDER: Kathyrn Sheriff  REFERRING DIAG: s/p lumbar fusion L2-5  Rationale for Evaluation and Treatment: Rehabilitation  THERAPY DIAG:  Difficulty in walking, not elsewhere  classified  Muscle weakness (generalized)  ONSET DATE: 11/20/21  SUBJECTIVE:                                                                                                                                                                                           SUBJECTIVE STATEMENT: Doing good, fatigue getting better. I only have 2 appt left  PERTINENT HISTORY:  See above  PAIN:  Are you having pain? Yes: NPRS scale: 1/10 Pain location: low back Pain description: tender, tight, ache Aggravating factors: touching it, standing, shopping, gets tired "very quick" 5 minutes standing, pain up to 6/10 Relieving factors: sit rest  PRECAUTIONS: Back  WEIGHT BEARING RESTRICTIONS: No  FALLS:  Has patient fallen in last 6 months? No  LIVING ENVIRONMENT: Lives with: lives alone Lives in: House/apartment Stairs: No Has following equipment at home: None  OCCUPATION: retired  PLOF: Independent does own housework some gym a few days a week  PATIENT GOALS: feel better, better motions, less difficulty standing, pick up things  NEXT MD VISIT:  January 3  OBJECTIVE:   DIAGNOSTIC FINDINGS:  healing  PATIENT SURVEYS:  FOTO 33  SCREENING FOR RED FLAGS: Bowel or bladder incontinence: No Spinal tumors: No Cauda equina syndrome: No Compression fracture: No Abdominal aneurysm: No  COGNITION: Overall cognitive status: Within functional limits for tasks assessed     SENSATION: Mild numbness in the left thigh  MUSCLE LENGTH: Hamstrings: Right 50 deg; Left 40 deg  POSTURE: decreased lumbar lordosis  PALPATION: Very tight and tender in the lumbar paraspinals and the mild tenderness in the buttocks  LUMBAR ROM: Following BLT rules he is very stiff especially with lateral motions   LOWER EXTREMITY ROM:   other motions guarded but WFL's  Active  Right eval Left eval  Hip flexion    Hip extension    Hip abduction    Hip adduction    Hip internal rotation    Hip external  rotation    Knee flexion    Knee extension  -15  Ankle dorsiflexion    Ankle plantarflexion    Ankle inversion    Ankle eversion     (Blank rows = not tested)  LOWER EXTREMITY MMT:    MMT Right eval Left eval  Hip flexion 4 4-  Hip extension    Hip abduction 4- 4-  Hip adduction    Hip internal rotation    Hip external rotation    Knee flexion 4- 4-  Knee extension 4- 4-  Ankle dorsiflexion 4 4  Ankle plantarflexion    Ankle inversion    Ankle eversion     (Blank rows = not tested)  FUNCTIONAL TESTS:  5 times sit to stand: 20 seconds Timed up and go (TUG): 17 3 minute walk test: stopped at 2 minutes 360 feet  GAIT: Distance walked: 100 feet Assistive device utilized: None Level of assistance: Complete Independence Comments: first few steps the left leg really gave way and he stumbled has trendelenberg on the left, on stairs he did one step at a time  TODAY'S TREATMENT:                                                                                                                              DATE:    04/15/22  TM 1.4 mph 5 min Nustep L 5 5 min Nustep L 4 2 min /2 min back [employer] 2 laps kettle bell RT , then Left  18 min straight cardio NO REST- last min SOB, no back pain, knee pain with kettle bell in Left hand Standing  on airex cable pulley row 15# 2 sets 10, then straight arm pull 10# 2 sets 10 15# AR press on airex 15 x each side STS wt ball chest press 10 x 2 sets Black tband trunk flex and ext 20 x    04/01/22 Nustep level 5 x 5 minutes Calf stretches Gait around the building, needed rest break due to back pain and SOB On airex 10# straight arm pulls 35# rows 15# AR press 35# lat pulls Black tband lumbar extension Black tband abs On airex cone toe touches CGA Side step on and off airex Arm wall slides for postural motions and mms Green tband horizontal abduction  03/30/22 Nustep level 5 x 6 minutes Tmill 5 minutes cues for step length  and for posture 1.5 mph x 3 mintues and then down to 1.3 x 2 minutes On airex straight arm pulls 10# 2x15 35# rows 2x10 35# lats 2x10 Black tband lumbar extension Chest press 10# Black tband abs 40# resisted gait all motions Feet on ball K2C trunk rotation, small bridge and isometric abs HS stretches   03/25/22 Gait around the back building fair pace no rest today 10# straight arm pulls on and off airex 35# seated row and lats 3x10 Education on posture and body mechanics, golfers lift, foot prop, mopping, vacuum, weight close, bend knees back straight and pivot all demo by PT and patient verbalized understanding Feet on ball K2C, trunk rotation, small bridge and ispometric abs Passive HS and piriformis stretch  03/23/22 Nustep level 5 x 6 minutes Gait outside larger lap around the back building, needed one rest due to fatigue, some knee pain going downhill 10# straight arm pulls 2x15 35# seated row 2x15 35# lats 2x15 15# [email or web address] Feet on ball K2C, trunk rotation, small bridge, isometric ab Passive stretch to the HS and the piriformis   03/18/22 UBE standing L 3 2 min fwd/2 min back TM 5 min working on cadeance and stamina 1.2-1.5 mph Blue wt ball lean back onto BOSU sitting with chest press as he comes to back up to sitting, then 10 x with lean back and up into standing chest press ( PTA assist to stab and keep LE down with weak core tries to engage and use hip flexors) Standing blue wt ball OH ext 10x ( limited ext and LB pulling) Standing wt ball trunk rotation 10 each Cable pulleys BIL hip 4 way 10 x each 10 # Cable pulley shld ext and rows with core engagement 15# [employer] 2 laps each hand 10# Leg Press 40# 2 sets 15. Calf raises 40# 2 sets 15 Black tband trunk flex and ext 20 x each     03/16/22 Nustep level 5 x 6 mintues side step on and off airex Gait around the back building fair pace, 2 rest breaks 10# straight arm pulls 2.5# standing hip abduction, marching  and extension Facing wall hand slides up for posture  3# wate bar overhead carry On airex cone toe touches Doorway stretch Leg press 40# 2x10 Walking direction changes  03/11/22 Nustep L 6 7 min Marching with OH press 10 feet 2 x 4# Walking with shld flex 10 feet 2 x 4# Walking with shld abd 10 feet 2 x 4# STS with wt ball pass 10 x Ball tap on airex to work on upright posture and decreasing stiffness Posterior lean onto BOSU with wt ball press 10 x Dynamic pulley ex Fast walking with ball toss PROM LE and trunk  03/09/22  Nustep level 6 x 6 mintues 25# triceps 2x15 10# biceps 2x15 Seated row 25#  Lats 35# 3x10 Chest press 10# 3x10 LEg press 40# 3x10 LEg curls 25# 3x10 Leg extension 10# small ROM 3x10 PAssive stretch HS and piriformis Feet on ball small bridge and isometric abs   PATIENT EDUCATION:  Education details: POC Person educated: Patient Education method: Explanation Education comprehension: verbalized understanding  HOME EXERCISE PROGRAM: Reviewed HEP from the hospital  ASSESSMENT:  CLINICAL IMPRESSION: 18 min of cardio without rest today and did well until farmer carry with wt on left increased limp d/t knee. Overall pt is doing better and progressing with strength and func. Pt should be goo dot wrap up next week  and transition back to independent gym program    OBJECTIVE IMPAIRMENTS: Abnormal gait, cardiopulmonary status limiting activity, decreased activity tolerance, decreased balance, decreased endurance, decreased mobility, difficulty walking, decreased ROM, decreased strength, increased muscle spasms, impaired flexibility, improper body mechanics, postural dysfunction, and pain.   REHAB POTENTIAL: Good  CLINICAL DECISION MAKING: Evolving/moderate complexity  EVALUATION COMPLEXITY: Low   GOALS: Goals reviewed with patient? Yes  SHORT TERM GOALS: Target date: 01/10/22  Independent with initial HEP Goal status: met  LONG TERM GOALS: Target  date: 03/31/22  Understand posture and body mechanics  Goal status: progressing 03/09/22 progressing 03/18/22  2.  Decrease pain 50% Goal status: met 03/09/22  3.  Increase lumbar ROM 25% Goal status: ongoing 02/11/22 and 02/18/22, WFLS but hold himslef guarded at times 03/05/22  4.  Walk 1000 feet  Goal status: progressing 02/18/22 with standing rest break 03/18/22 progressing  5.  Be able to put on shoes and socks without difficulty Goal status: progressing 02/23/22 MET 03/05/22   PLAN:  PT FREQUENCY: 1-2x/week  PT DURATION: 12 weeks  PLANNED INTERVENTIONS: Therapeutic exercises, Therapeutic activity, Neuromuscular re-education, Balance training, Gait training, Patient/Family education, Self Care, Joint mobilization, Electrical stimulation, Cryotherapy, Moist heat, and Manual therapy.  PLAN FOR NEXT SESSION:  continue to work on his posture, strength and overall function  Ares Tegtmeyer,ANGIE, PTA 04/15/2022, 4:11 PM [facility]. [place], [place], [facility]. [place], [place], [address] Phone: [phone]   Fax:  [phone]  Patient Details  Name: Rudolph Seliger MRN: [id number] Date of Birth: 08/15/1940 Referring Provider:  Haydee Salter, MD  Encounter Date: 04/15/2022   Laqueta Carina, PTA 04/15/2022, 4:11 PM  [facility] at [facility]. [place], [place], [address] Phone: [phone]   Fax:  [phone]

## 2022-04-19 ENCOUNTER — Ambulatory Visit: Payer: Medicare PPO | Admitting: Physical Therapy

## 2022-04-19 ENCOUNTER — Encounter: Payer: Self-pay | Admitting: Physical Therapy

## 2022-04-19 DIAGNOSIS — R293 Abnormal posture: Secondary | ICD-10-CM | POA: Diagnosis not present

## 2022-04-19 DIAGNOSIS — R262 Difficulty in walking, not elsewhere classified: Secondary | ICD-10-CM | POA: Diagnosis not present

## 2022-04-19 DIAGNOSIS — M5459 Other low back pain: Secondary | ICD-10-CM | POA: Diagnosis not present

## 2022-04-19 DIAGNOSIS — M6281 Muscle weakness (generalized): Secondary | ICD-10-CM

## 2022-04-19 NOTE — Therapy (Signed)
OUTPATIENT PHYSICAL THERAPY THORACOLUMBAR TREATMENT      Patient Name: Joshua Ruiz MRN: [id number] DOB:11/23/1940, 82 y.o., male Today's Date: 04/19/2022  END OF SESSION:  PT End of Session - 04/19/22 1623     Visit Number 22    Date for PT Re-Evaluation 04/22/22    Authorization Time Period 11/12    PT Start Time 1612    PT Stop Time 1700    PT Time Calculation (min) 48 min    Activity Tolerance Patient tolerated treatment well    Behavior During Therapy WFL for tasks assessed/performed             Past Medical History:  Diagnosis Date   GERD (gastroesophageal reflux disease)    Rheumatoid arthritis    Past Surgical History:  Procedure Laterality Date   EYE SURGERY Bilateral    cataract extraction   HEMORRHOID SURGERY     LAMINECTOMY WITH POSTERIOR LATERAL ARTHRODESIS LEVEL 3 N/A 11/20/2021   Procedure: LUMBAR TWO THROUGH LUMBAR THREE, LUMBAR THREE THROUGH LUMBAR FOUR, LUMBAR FOUR THROUGH LUMBAR FIVE LAMINECTOMY WITH FACETECTOMY, POSTERIOR SEGMENTAL INSTRUMENTATION, POSTEROLATERAL ARTHRODESIS;  Surgeon: Lisbeth Renshaw, MD;  Location: [facility] OR;  Service: Neurosurgery;  Laterality: N/A;  3C   LYMPH NODE BIOPSY     Neck   TRIGGER FINGER RELEASE Right    3rd   Patient Active Problem List   Diagnosis Date Noted   Chronic rhinitis 02/15/2022   Eczema 02/15/2022   Spondylolisthesis of lumbar region 11/20/2021   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/2021   Lumbar spondylosis 10/20/2021   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-L5 level 10/20/2021   Primary osteoarthritis of left knee 08/14/2021   Chronic epididymitis 08/13/2020   Hives 07/11/2020   Rheumatoid arthritis 07/11/2020   Osteoarthritis of left knee 07/11/2020   Cervical arthritis 07/11/2020   Gastroesophageal reflux disease 07/11/2020   Presbyopia of both eyes 04/20/2015   Pseudophakia of both eyes 04/20/2015    PCP: Veto Kemps  REFERRING PROVIDER: Conchita Paris  REFERRING DIAG: s/p lumbar fusion  L2-5  Rationale for Evaluation and Treatment: Rehabilitation  THERAPY DIAG:  Difficulty in walking, not elsewhere classified  Muscle weakness (generalized)  Abnormal posture  Other low back pain  ONSET DATE: 11/20/21  SUBJECTIVE:                                                                                                                                                                                           SUBJECTIVE STATEMENT: I was tired after the last treatment does report right shoulder pain with the use of the red band PERTINENT HISTORY:  See above  PAIN:  Are you having pain?  Yes: NPRS scale: 1/10 Pain location: low back Pain description: tender, tight, ache Aggravating factors: touching it, standing, shopping, gets tired "very quick" 5 minutes standing, pain up to 6/10 Relieving factors: sit rest  PRECAUTIONS: Back  WEIGHT BEARING RESTRICTIONS: No  FALLS:  Has patient fallen in last 6 months? No  LIVING ENVIRONMENT: Lives with: lives alone Lives in: House/apartment Stairs: No Has following equipment at home: None  OCCUPATION: retired  PLOF: Independent does own housework some gym a few days a week  PATIENT GOALS: feel better, better motions, less difficulty standing, pick up things  NEXT MD VISIT:  January 3  OBJECTIVE:   DIAGNOSTIC FINDINGS:  healing  PATIENT SURVEYS:  FOTO 33  SCREENING FOR RED FLAGS: Bowel or bladder incontinence: No Spinal tumors: No Cauda equina syndrome: No Compression fracture: No Abdominal aneurysm: No  COGNITION: Overall cognitive status: Within functional limits for tasks assessed     SENSATION: Mild numbness in the left thigh  MUSCLE LENGTH: Hamstrings: Right 50 deg; Left 40 deg  POSTURE: decreased lumbar lordosis  PALPATION: Very tight and tender in the lumbar paraspinals and the mild tenderness in the buttocks  LUMBAR ROM: Following BLT rules he is very stiff especially with lateral  motions   LOWER EXTREMITY ROM:   other motions guarded but WFL's  Active  Right eval Left eval  Hip flexion    Hip extension    Hip abduction    Hip adduction    Hip internal rotation    Hip external rotation    Knee flexion    Knee extension  -15  Ankle dorsiflexion    Ankle plantarflexion    Ankle inversion    Ankle eversion     (Blank rows = not tested)  LOWER EXTREMITY MMT:    MMT Right eval Left eval  Hip flexion 4 4-  Hip extension    Hip abduction 4- 4-  Hip adduction    Hip internal rotation    Hip external rotation    Knee flexion 4- 4-  Knee extension 4- 4-  Ankle dorsiflexion 4 4  Ankle plantarflexion    Ankle inversion    Ankle eversion     (Blank rows = not tested)  FUNCTIONAL TESTS:  5 times sit to stand: 20 seconds Timed up and go (TUG): 17 3 minute walk test: stopped at 2 minutes 360 feet  GAIT: Distance walked: 100 feet Assistive device utilized: None Level of assistance: Complete Independence Comments: first few steps the left leg really gave way and he stumbled has trendelenberg on the left, on stairs he did one step at a time  TODAY'S TREATMENT:                                                                                                                              DATE:   04/19/22 Gait outside around the building no breaks Nustep level 5 x 5 minutes UBE level 3 x  3 minutes Leg press 50# 2x10 Leg curls 35# 2x10 Leg ext 10# 2x10 15# AR press x10 each, then 20# x10 Seated rows 35# Lats 35# Chest press 10#  04/15/22  TM 1.4 mph 5 min Nustep L 5 5 min Nustep L 4 2 min /2 min back [employer] 2 laps kettle bell RT , then Left  18 min straight cardio NO REST- last min SOB, no back pain, knee pain with kettle bell in Left hand Standing on airex cable pulley row 15# 2 sets 10, then straight arm pull 10# 2 sets 10 15# AR press on airex 15 x each side STS wt ball chest press 10 x 2 sets Black tband trunk flex and ext 20  x    04/01/22 Nustep level 5 x 5 minutes Calf stretches Gait around the building, needed rest break due to back pain and SOB On airex 10# straight arm pulls 35# rows 15# AR press 35# lat pulls Black tband lumbar extension Black tband abs On airex cone toe touches CGA Side step on and off airex Arm wall slides for postural motions and mms Green tband horizontal abduction  03/30/22 Nustep level 5 x 6 minutes Tmill 5 minutes cues for step length and for posture 1.5 mph x 3 mintues and then down to 1.3 x 2 minutes On airex straight arm pulls 10# 2x15 35# rows 2x10 35# lats 2x10 Black tband lumbar extension Chest press 10# Black tband abs 40# resisted gait all motions Feet on ball K2C trunk rotation, small bridge and isometric abs HS stretches   03/25/22 Gait around the back building fair pace no rest today 10# straight arm pulls on and off airex 35# seated row and lats 3x10 Education on posture and body mechanics, golfers lift, foot prop, mopping, vacuum, weight close, bend knees back straight and pivot all demo by PT and patient verbalized understanding Feet on ball K2C, trunk rotation, small bridge and ispometric abs Passive HS and piriformis stretch  03/23/22 Nustep level 5 x 6 minutes Gait outside larger lap around the back building, needed one rest due to fatigue, some knee pain going downhill 10# straight arm pulls 2x15 35# seated row 2x15 35# lats 2x15 15# [email or web address] Feet on ball K2C, trunk rotation, small bridge, isometric ab Passive stretch to the HS and the piriformis   03/18/22 UBE standing L 3 2 min fwd/2 min back TM 5 min working on cadeance and stamina 1.2-1.5 mph Blue wt ball lean back onto BOSU sitting with chest press as he comes to back up to sitting, then 10 x with lean back and up into standing chest press ( PTA assist to stab and keep LE down with weak core tries to engage and use hip flexors) Standing blue wt ball OH ext 10x ( limited ext and LB  pulling) Standing wt ball trunk rotation 10 each Cable pulleys BIL hip 4 way 10 x each 10 # Cable pulley shld ext and rows with core engagement 15# [employer] 2 laps each hand 10# Leg Press 40# 2 sets 15. Calf raises 40# 2 sets 15 Black tband trunk flex and ext 20 x each      PATIENT EDUCATION:  Education details: POC Person educated: Patient Education method: Explanation Education comprehension: verbalized understanding  HOME EXERCISE PROGRAM: Reviewed HEP from the hospital  ASSESSMENT:  CLINICAL IMPRESSION: all exercises today were performed with education on the use of the machines, posture, adjustment and how to perform as we are trying to  d/c him to the independent gym program, he did well does have some questions and needs to be re told to back down on the weight that he was doing prior to his surgery.  OBJECTIVE IMPAIRMENTS: Abnormal gait, cardiopulmonary status limiting activity, decreased activity tolerance, decreased balance, decreased endurance, decreased mobility, difficulty walking, decreased ROM, decreased strength, increased muscle spasms, impaired flexibility, improper body mechanics, postural dysfunction, and pain.   REHAB POTENTIAL: Good  CLINICAL DECISION MAKING: Evolving/moderate complexity  EVALUATION COMPLEXITY: Low   GOALS: Goals reviewed with patient? Yes  SHORT TERM GOALS: Target date: 01/10/22  Independent with initial HEP Goal status: met  LONG TERM GOALS: Target date: 03/31/22  Understand posture and body mechanics  Goal status: progressing 03/09/22 progressing 03/18/22  2.  Decrease pain 50% Goal status: met 03/09/22  3.  Increase lumbar ROM 25% Goal status: ongoing 02/11/22 and 02/18/22, WFLS but hold himslef guarded at times 03/05/22  4.  Walk 1000 feet  Goal status: progressing 02/18/22 with standing rest break 03/18/22 progressing  5.  Be able to put on shoes and socks without difficulty Goal status: progressing 02/23/22 MET  03/05/22   PLAN:  PT FREQUENCY: 1-2x/week  PT DURATION: 12 weeks  PLANNED INTERVENTIONS: Therapeutic exercises, Therapeutic activity, Neuromuscular re-education, Balance training, Gait training, Patient/Family education, Self Care, Joint mobilization, Electrical stimulation, Cryotherapy, Moist heat, and Manual therapy.  PLAN FOR NEXT SESSION:  possible d/c work on independent gym, flexibility Jearld Lesch, PT 04/19/2022, 4:23 PM [facility] at [facility].

## 2022-04-21 ENCOUNTER — Ambulatory Visit: Payer: Medicare PPO | Admitting: Physical Therapy

## 2022-04-21 ENCOUNTER — Encounter: Payer: Self-pay | Admitting: Physical Therapy

## 2022-04-21 DIAGNOSIS — M5459 Other low back pain: Secondary | ICD-10-CM | POA: Diagnosis not present

## 2022-04-21 DIAGNOSIS — M6281 Muscle weakness (generalized): Secondary | ICD-10-CM

## 2022-04-21 DIAGNOSIS — R293 Abnormal posture: Secondary | ICD-10-CM

## 2022-04-21 DIAGNOSIS — R262 Difficulty in walking, not elsewhere classified: Secondary | ICD-10-CM

## 2022-04-21 NOTE — Therapy (Signed)
OUTPATIENT PHYSICAL THERAPY THORACOLUMBAR TREATMENT      Patient Name: Joshua Ruiz MRN: [id number] DOB:1940/07/23, 82 y.o., male Today's Date: 04/21/2022  END OF SESSION:  PT End of Session - 04/21/22 1309     Visit Number 23    Date for PT Re-Evaluation 04/22/22    Authorization Type [employer]    Authorization Time Period 12/12    PT Start Time 1309    PT Stop Time 1355    PT Time Calculation (min) 46 min    Activity Tolerance Patient tolerated treatment well    Behavior During Therapy WFL for tasks assessed/performed             Past Medical History:  Diagnosis Date   GERD (gastroesophageal reflux disease)    Rheumatoid arthritis    Past Surgical History:  Procedure Laterality Date   EYE SURGERY Bilateral    cataract extraction   HEMORRHOID SURGERY     LAMINECTOMY WITH POSTERIOR LATERAL ARTHRODESIS LEVEL 3 N/A 11/20/2021   Procedure: LUMBAR TWO THROUGH LUMBAR THREE, LUMBAR THREE THROUGH LUMBAR FOUR, LUMBAR FOUR THROUGH LUMBAR FIVE LAMINECTOMY WITH FACETECTOMY, POSTERIOR SEGMENTAL INSTRUMENTATION, POSTEROLATERAL ARTHRODESIS;  Surgeon: Lisbeth Renshaw, MD;  Location: [facility] OR;  Service: Neurosurgery;  Laterality: N/A;  3C   LYMPH NODE BIOPSY     Neck   TRIGGER FINGER RELEASE Right    3rd   Patient Active Problem List   Diagnosis Date Noted   Chronic rhinitis 02/15/2022   Eczema 02/15/2022   Spondylolisthesis of lumbar region 11/20/2021   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/2021   Lumbar spondylosis 10/20/2021   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-L5 level 10/20/2021   Primary osteoarthritis of left knee 08/14/2021   Chronic epididymitis 08/13/2020   Hives 07/11/2020   Rheumatoid arthritis 07/11/2020   Osteoarthritis of left knee 07/11/2020   Cervical arthritis 07/11/2020   Gastroesophageal reflux disease 07/11/2020   Presbyopia of both eyes 04/20/2015   Pseudophakia of both eyes 04/20/2015    PCP: Veto Kemps  REFERRING PROVIDER: Conchita Paris  REFERRING  DIAG: s/p lumbar fusion L2-5  Rationale for Evaluation and Treatment: Rehabilitation  THERAPY DIAG:  Difficulty in walking, not elsewhere classified  Muscle weakness (generalized)  Abnormal posture  Other low back pain  ONSET DATE: 11/20/21  SUBJECTIVE:                                                                                                                                                                                           SUBJECTIVE STATEMENT: Doing well, no problems, feeling better.   PERTINENT HISTORY:  See above  PAIN:  Are you having pain? Yes: NPRS scale: 1/10 Pain  location: low back Pain description: tender, tight, ache Aggravating factors: touching it, standing, shopping, gets tired "very quick" 5 minutes standing, pain up to 6/10 Relieving factors: sit rest  PRECAUTIONS: Back  WEIGHT BEARING RESTRICTIONS: No  FALLS:  Has patient fallen in last 6 months? No  LIVING ENVIRONMENT: Lives with: lives alone Lives in: House/apartment Stairs: No Has following equipment at home: None  OCCUPATION: retired  PLOF: Independent does own housework some gym a few days a week  PATIENT GOALS: feel better, better motions, less difficulty standing, pick up things  NEXT MD VISIT:  January 3  OBJECTIVE:   DIAGNOSTIC FINDINGS:  healing  PATIENT SURVEYS:  FOTO 33  SCREENING FOR RED FLAGS: Bowel or bladder incontinence: No Spinal tumors: No Cauda equina syndrome: No Compression fracture: No Abdominal aneurysm: No  COGNITION: Overall cognitive status: Within functional limits for tasks assessed     SENSATION: Mild numbness in the left thigh  MUSCLE LENGTH: Hamstrings: Right 50 deg; Left 40 deg  POSTURE: decreased lumbar lordosis  PALPATION: Very tight and tender in the lumbar paraspinals and the mild tenderness in the buttocks  LUMBAR ROM: Following BLT rules he is very stiff especially with lateral motions   LOWER EXTREMITY ROM:   other  motions guarded but WFL's  Active  Right eval Left eval  Hip flexion    Hip extension    Hip abduction    Hip adduction    Hip internal rotation    Hip external rotation    Knee flexion    Knee extension  -15  Ankle dorsiflexion    Ankle plantarflexion    Ankle inversion    Ankle eversion     (Blank rows = not tested)  LOWER EXTREMITY MMT:    MMT Right eval Left eval  Hip flexion 4 4-  Hip extension    Hip abduction 4- 4-  Hip adduction    Hip internal rotation    Hip external rotation    Knee flexion 4- 4-  Knee extension 4- 4-  Ankle dorsiflexion 4 4  Ankle plantarflexion    Ankle inversion    Ankle eversion     (Blank rows = not tested)  FUNCTIONAL TESTS:  5 times sit to stand: 20 seconds Timed up and go (TUG): 17 3 minute walk test: stopped at 2 minutes 360 feet  GAIT: Distance walked: 100 feet Assistive device utilized: None Level of assistance: Complete Independence Comments: first few steps the left leg really gave way and he stumbled has trendelenberg on the left, on stairs he did one step at a time  TODAY'S TREATMENT:                                                                                                                              DATE:   04/21/22 Nustep level 5 x 7 minutes 35# triceps 15# biceps LE stretches and finalized posture and body mechanics for ADL's Reviewed the  machines, adjustments, weights, reps sets and forms   04/19/22 Gait outside around the building no breaks Nustep level 5 x 5 minutes UBE level 3 x 3 minutes Leg press 50# 2x10 Leg curls 35# 2x10 Leg ext 10# 2x10 15# AR press x10 each, then 20# x10 Seated rows 35# Lats 35# Chest press 10#  04/15/22  TM 1.4 mph 5 min Nustep L 5 5 min Nustep L 4 2 min /2 min back [employer] 2 laps kettle bell RT , then Left  18 min straight cardio NO REST- last min SOB, no back pain, knee pain with kettle bell in Left hand Standing on airex cable pulley row 15# 2 sets 10,  then straight arm pull 10# 2 sets 10 15# AR press on airex 15 x each side STS wt ball chest press 10 x 2 sets Black tband trunk flex and ext 20 x    04/01/22 Nustep level 5 x 5 minutes Calf stretches Gait around the building, needed rest break due to back pain and SOB On airex 10# straight arm pulls 35# rows 15# AR press 35# lat pulls Black tband lumbar extension Black tband abs On airex cone toe touches CGA Side step on and off airex Arm wall slides for postural motions and mms Green tband horizontal abduction  03/30/22 Nustep level 5 x 6 minutes Tmill 5 minutes cues for step length and for posture 1.5 mph x 3 mintues and then down to 1.3 x 2 minutes On airex straight arm pulls 10# 2x15 35# rows 2x10 35# lats 2x10 Black tband lumbar extension Chest press 10# Black tband abs 40# resisted gait all motions Feet on ball K2C trunk rotation, small bridge and isometric abs HS stretches   03/25/22 Gait around the back building fair pace no rest today 10# straight arm pulls on and off airex 35# seated row and lats 3x10 Education on posture and body mechanics, golfers lift, foot prop, mopping, vacuum, weight close, bend knees back straight and pivot all demo by PT and patient verbalized understanding Feet on ball K2C, trunk rotation, small bridge and ispometric abs Passive HS and piriformis stretch  03/23/22 Nustep level 5 x 6 minutes Gait outside larger lap around the back building, needed one rest due to fatigue, some knee pain going downhill 10# straight arm pulls 2x15 35# seated row 2x15 35# lats 2x15 15# [email or web address] Feet on ball K2C, trunk rotation, small bridge, isometric ab Passive stretch to the HS and the piriformis   03/18/22 UBE standing L 3 2 min fwd/2 min back TM 5 min working on cadeance and stamina 1.2-1.5 mph Blue wt ball lean back onto BOSU sitting with chest press as he comes to back up to sitting, then 10 x with lean back and up into standing chest press (  PTA assist to stab and keep LE down with weak core tries to engage and use hip flexors) Standing blue wt ball OH ext 10x ( limited ext and LB pulling) Standing wt ball trunk rotation 10 each Cable pulleys BIL hip 4 way 10 x each 10 # Cable pulley shld ext and rows with core engagement 15# [employer] 2 laps each hand 10# Leg Press 40# 2 sets 15. Calf raises 40# 2 sets 15 Black tband trunk flex and ext 20 x each      PATIENT EDUCATION:  Education details: POC Person educated: Patient Education method: Explanation Education comprehension: verbalized understanding  HOME EXERCISE PROGRAM: Reviewed HEP from the hospital  ASSESSMENT:  CLINICAL IMPRESSION: really worked on assuring his independence and his safety with the use of the equipment for his own independent gym routine, we talked aobut low weights and not stressing too much, went over the posture and body mechanics.  Answered his questions  OBJECTIVE IMPAIRMENTS: Abnormal gait, cardiopulmonary status limiting activity, decreased activity tolerance, decreased balance, decreased endurance, decreased mobility, difficulty walking, decreased ROM, decreased strength, increased muscle spasms, impaired flexibility, improper body mechanics, postural dysfunction, and pain.   REHAB POTENTIAL: Good  CLINICAL DECISION MAKING: Evolving/moderate complexity  EVALUATION COMPLEXITY: Low   GOALS: Goals reviewed with patient? Yes  SHORT TERM GOALS: Target date: 01/10/22  Independent with initial HEP Goal status: met  LONG TERM GOALS: Target date: 03/31/22  Understand posture and body mechanics  Goal status: progressing 03/09/22 progressing 03/18/22  2.  Decrease pain 50% Goal status: met 03/09/22  3.  Increase lumbar ROM 25% Goal status: met  4.  Walk 1000 feet  Goal status: met 04/21/22  5.  Be able to put on shoes and socks without difficulty Goal status: progressing 02/23/22 MET 03/05/22   PLAN:  PT FREQUENCY:  1-2x/week  PT DURATION: 12 weeks  PLANNED INTERVENTIONS: Therapeutic exercises, Therapeutic activity, Neuromuscular re-education, Balance training, Gait training, Patient/Family education, Self Care, Joint mobilization, Electrical stimulation, Cryotherapy, Moist heat, and Manual therapy.  PLAN FOR NEXT SESSION:  D/C goals met Jearld Lesch, PT 04/21/2022, 1:09 PM [facility] at [facility].

## 2022-05-10 DIAGNOSIS — Z79899 Other long term (current) drug therapy: Secondary | ICD-10-CM | POA: Diagnosis not present

## 2022-05-19 ENCOUNTER — Other Ambulatory Visit: Payer: Self-pay

## 2022-05-19 ENCOUNTER — Telehealth: Payer: Self-pay | Admitting: Orthopaedic Surgery

## 2022-05-19 DIAGNOSIS — M1712 Unilateral primary osteoarthritis, left knee: Secondary | ICD-10-CM

## 2022-05-19 NOTE — Telephone Encounter (Signed)
Talked with patient and appointment for gel injection has been scheduled.  Patient would like to know if he can receive a Handicap Placard at office visit on 05/19/2022.  Please advise.  Thank you.

## 2022-05-19 NOTE — Telephone Encounter (Signed)
Patient called. He would like to know the status of the gel injection. His call back number is [phone]

## 2022-05-19 NOTE — Telephone Encounter (Signed)
Note has been added to his appointment.

## 2022-05-20 ENCOUNTER — Ambulatory Visit: Payer: Medicare PPO | Admitting: Orthopaedic Surgery

## 2022-05-20 DIAGNOSIS — M1712 Unilateral primary osteoarthritis, left knee: Secondary | ICD-10-CM

## 2022-05-20 MED ORDER — HYALURONAN 88 MG/4ML IX SOSY
88.0000 mg | PREFILLED_SYRINGE | INTRA_ARTICULAR | Status: AC | PRN
  Administered 2022-05-20: 88 mg via INTRA_ARTICULAR

## 2022-05-20 NOTE — Progress Notes (Signed)
   Office Visit Note   Patient: Joshua Ruiz           Date of Birth: [birth year]           MRN: [id number] Visit Date: 05/20/2022              Requested by: Loyola Mast, MD [address] [place],  [place] [address] PCP: Loyola Mast, MD   Assessment & Plan: Visit Diagnoses:  1. Primary osteoarthritis of left knee     Plan: Sherron Ales returns today for left knee Monovisc injection.  He tolerated this well.  Follow-up as needed.  Follow-Up Instructions: No follow-ups on file.   Orders:  No orders of the defined types were placed in this encounter.  No orders of the defined types were placed in this encounter.     Procedures: Large Joint Inj: L knee on 05/20/2022 9:39 AM Indications: pain Details: 22 G needle  Arthrogram: No  Medications: 88 mg Hyaluronan 88 MG/4ML Outcome: tolerated well, no immediate complications Patient was prepped and draped in the usual sterile fashion.       Clinical Data: No additional findings.   Subjective: Chief Complaint  Patient presents with   Left Knee - Pain    HPI  Review of Systems   Objective: Vital Signs: There were no vitals taken for this visit.  Physical Exam  Ortho Exam  Specialty Comments:  No specialty comments available.  Imaging: No results found.   PMFS History: Patient Active Problem List   Diagnosis Date Noted   Chronic rhinitis 02/15/2022   Eczema 02/15/2022   Spondylolisthesis of lumbar region 11/20/2021   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/2021   Lumbar spondylosis 10/20/2021   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-L5 level 10/20/2021   Primary osteoarthritis of left knee 08/14/2021   Chronic epididymitis 08/13/2020   Hives 07/11/2020   Rheumatoid arthritis (HCC) 07/11/2020   Osteoarthritis of left knee 07/11/2020   Cervical arthritis 07/11/2020   Gastroesophageal reflux disease 07/11/2020   Presbyopia of both eyes 04/20/2015   Pseudophakia of both eyes 04/20/2015    Past Medical History:  Diagnosis Date   GERD (gastroesophageal reflux disease)    Rheumatoid arthritis (HCC)     Family History  Problem Relation Age of Onset   Rheum arthritis Mother    Diabetes Mother    Cancer Sister        Lung   Cancer Brother        Lung    Past Surgical History:  Procedure Laterality Date   EYE SURGERY Bilateral    cataract extraction   HEMORRHOID SURGERY     LAMINECTOMY WITH POSTERIOR LATERAL ARTHRODESIS LEVEL 3 N/A 11/20/2021   Procedure: LUMBAR TWO THROUGH LUMBAR THREE, LUMBAR THREE THROUGH LUMBAR FOUR, LUMBAR FOUR THROUGH LUMBAR FIVE LAMINECTOMY WITH FACETECTOMY, POSTERIOR SEGMENTAL INSTRUMENTATION, POSTEROLATERAL ARTHRODESIS;  Surgeon: Lisbeth Renshaw, MD;  Location: [facility] OR;  Service: Neurosurgery;  Laterality: N/A;  3C   LYMPH NODE BIOPSY     Neck   TRIGGER FINGER RELEASE Right    3rd   Social History   Occupational History   Not on file  Tobacco Use   Smoking status: Never    Passive exposure: Never   Smokeless tobacco: Never  Vaping Use   Vaping Use: Never used  Substance and Sexual Activity   Alcohol use: Not Currently    Comment: none   Drug use: Never   Sexual activity: Yes

## 2022-06-10 ENCOUNTER — Telehealth: Payer: Self-pay | Admitting: Orthopaedic Surgery

## 2022-06-10 NOTE — Telephone Encounter (Signed)
Patient asking to go ahead with Knee replacement

## 2022-06-17 ENCOUNTER — Ambulatory Visit: Payer: Medicare PPO | Admitting: Family Medicine

## 2022-06-17 ENCOUNTER — Encounter: Payer: Self-pay | Admitting: Family Medicine

## 2022-06-17 VITALS — BP 138/76 | HR 68 | Temp 97.9°F | Ht 68.0 in | Wt 200.0 lb

## 2022-06-17 DIAGNOSIS — M1712 Unilateral primary osteoarthritis, left knee: Secondary | ICD-10-CM

## 2022-06-17 DIAGNOSIS — Z01818 Encounter for other preprocedural examination: Secondary | ICD-10-CM

## 2022-06-17 DIAGNOSIS — M069 Rheumatoid arthritis, unspecified: Secondary | ICD-10-CM | POA: Diagnosis not present

## 2022-06-17 LAB — COMPREHENSIVE METABOLIC PANEL
ALT: 11 U/L (ref 0–53)
AST: 17 U/L (ref 0–37)
Albumin: 4.1 g/dL (ref 3.5–5.2)
Alkaline Phosphatase: 103 U/L (ref 39–117)
BUN: 14 mg/dL (ref 6–23)
CO2: 28 mEq/L (ref 19–32)
Calcium: 8.6 mg/dL (ref 8.4–10.5)
Chloride: 105 mEq/L (ref 96–112)
Creatinine, Ser: 0.87 mg/dL (ref 0.40–1.50)
GFR: 80.41 mL/min (ref >60.00)
Glucose, Bld: 85 mg/dL (ref 70–99)
Potassium: 4.3 mEq/L (ref 3.5–5.1)
Sodium: 140 mEq/L (ref 135–145)
Total Bilirubin: 0.6 mg/dL (ref 0.2–1.2)
Total Protein: 6.6 g/dL (ref 6.0–8.3)

## 2022-06-17 LAB — CBC
HCT: 38 % — ABNORMAL LOW (ref 39.0–52.0)
Hemoglobin: 13 g/dL (ref 13.0–17.0)
MCHC: 34.1 g/dL (ref 30.0–36.0)
MCV: 92.7 fl (ref 78.0–100.0)
Platelets: 220 10*3/uL (ref 150.0–400.0)
RBC: 4.1 Mil/uL — ABNORMAL LOW (ref 4.22–5.81)
RDW: 14.2 % (ref 11.5–15.5)
WBC: 5.6 10*3/uL (ref 4.0–10.5)

## 2022-06-17 NOTE — Progress Notes (Signed)
[facility] [address] [place] [address] Dept: [phone] Dept Fax: [phone]  Preoperative Exam Visit  Subjective:    Patient ID: Joshua Ruiz, male    DOB: Apr 19, 1940, 82 y.o..   MRN: [id number]  Chief Complaint  Patient presents with   Pre-op Exam    Pre-op clearance for LT knee replacement.     History of Present Illness:  Patient is in today for preoperative clearance at the request fo Dr. Roda Shutters (orthopedics) for a left total knee replacement with spinal anesthesia. He has a history of progressive osteoarthritis of the left knee. He notes the knee stays chronically swollen and painful. It also alters his left foot position when walking.   Joshua Ruiz has a history of rheumatoid arthritis. He is managed on MTX 15 mg every Saturday. he does take a dialy folic acid 1 mg supplement. He is also on hydroxychloroquine 200 mg twice daily.  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   Lumbar spondylosis 10/20/2021   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-L5 level 10/20/2021   Primary osteoarthritis of left knee 08/14/2021   Chronic epididymitis 08/13/2020   Hives 07/11/2020   Rheumatoid arthritis (HCC) 07/11/2020   Cervical arthritis 07/11/2020   Gastroesophageal reflux disease 07/11/2020   Presbyopia of both eyes 04/20/2015   Pseudophakia of both eyes 04/20/2015   Past Surgical History:  Procedure Laterality Date   EYE SURGERY Bilateral    cataract extraction   HEMORRHOID SURGERY     LAMINECTOMY WITH POSTERIOR LATERAL ARTHRODESIS LEVEL 3 N/A 11/20/2021   Procedure: LUMBAR TWO THROUGH LUMBAR THREE, LUMBAR THREE THROUGH LUMBAR FOUR, LUMBAR FOUR THROUGH LUMBAR FIVE LAMINECTOMY WITH FACETECTOMY, POSTERIOR SEGMENTAL INSTRUMENTATION,  POSTEROLATERAL ARTHRODESIS;  Surgeon: Lisbeth Renshaw, MD;  Location: [facility] OR;  Service: Neurosurgery;  Laterality: N/A;  3C   LYMPH NODE BIOPSY     Neck   TRIGGER FINGER RELEASE Right    3rd   Family History  Problem Relation Age of Onset   Rheum arthritis Mother    Diabetes Mother    Cancer Sister        Lung   Cancer Brother        Lung   Outpatient Medications Prior to Visit  Medication Sig Dispense Refill   acetaminophen (TYLENOL) 325 MG tablet Take 650 mg by mouth every 6 (six) hours as needed for moderate pain.     azelastine (ASTELIN) 0.1 % nasal spray Place 1 spray into both nostrils 2 (two) times daily. 30 mL 12   cetirizine (ZYRTEC) 10 MG tablet Take 10 mg by mouth at bedtime.     finasteride (PROSCAR) 5 MG tablet Take 5 mg by mouth daily.     folic acid (FOLVITE) 1 MG tablet Take 1 mg by mouth daily.     hydroxychloroquine (PLAQUENIL) 200 MG tablet Take 200 mg by mouth 2 (two) times daily.     loratadine (CLARITIN) 10 MG tablet Take 10 mg by mouth in the morning.     methocarbamol (ROBAXIN) 500 MG tablet Take 1 tablet (500 mg total) by mouth every 6 (six) hours as needed for muscle spasms. 90 tablet 0   methotrexate (RHEUMATREX) 2.5 MG tablet Take 15 mg by mouth every Saturday. Caution:Chemotherapy. Protect from light.     mometasone (ELOCON) 0.1 % cream Apply topically daily.  15 g 1   Multiple Vitamin (MULTIVITAMIN WITH MINERALS) TABS tablet Take 1 tablet by mouth daily.     Multiple Vitamins-Minerals (PRESERVISION AREDS 2) CAPS Take 1 capsule by mouth in the morning and at bedtime.     naproxen sodium (ALEVE) 220 MG tablet Take 220 mg by mouth daily as needed (pain).     omeprazole (PRILOSEC) 20 MG capsule Take 20 mg by mouth in the morning and at bedtime.     tadalafil (CIALIS) 20 MG tablet Take 20 mg by mouth daily as needed.     No facility-administered medications prior to visit.   No Known Allergies   Objective:   Today's Vitals   06/17/22 1022  BP: 138/76   Pulse: 68  Temp: 97.9 F (36.6 C)  TempSrc: Temporal  SpO2: 98%  Weight: 200 lb (90.7 kg)  Height: 5\' 8"  (1.727 m)   Body mass index is 30.41 kg/m.   General: Well developed, well nourished. No acute distress. HEENT: Normocephalic, non-traumatic. PERRL, EOMI. Conjunctiva clear. External ears normal. EAC and TMs normal bilaterally. Nose    clear without congestion or rhinorrhea. Mucous membranes moist. Oropharynx clear. Good dentition. Neck: Supple. No lymphadenopathy. No thyromegaly. Lungs: Clear to auscultation bilaterally. No wheezing, rales or rhonchi. CV: RRR without murmurs or rubs. Pulses 2+ bilaterally. Abdomen: Soft, non-tender. Bowel sounds positive, normal pitch and frequency. No hepatosplenomegaly. No rebound or guarding. Extremities: Left knee is enlarged with increased warmth. There is some limited extension of the knee. Full ROM of other joints and no   joint swelling or tenderness. No edema noted. Skin: Warm and dry. No rashes. Psych: Alert and oriented. Normal mood and affect.  Health Maintenance Due  Topic Date Due   DTaP/Tdap/Td (1 - Tdap) Never done     Assessment & Plan:  1. Preoperative examination Overall, patient is healthy and would be considered low risk for surgery. I will check a CBC and CMP to assess for any potential issues related to his long-term use of methotrexate and hydroxychloroquine.  - CBC - Comprehensive metabolic panel  2. Primary osteoarthritis of left knee Agree with plan for total knee joint replacement.  3. Rheumatoid arthritis involving multiple sites, unspecified whether rheumatoid factor present (HCC) In remission. No sign of active inflammation. Continue MTX 15 mg every Saturday, folic acid 1 mg daily, and hydroxychloroquine 200 mg twice daily.   Return for Follow-up as scheduled.   Loyola Mast, MD

## 2022-06-25 ENCOUNTER — Other Ambulatory Visit: Payer: Self-pay

## 2022-07-08 DIAGNOSIS — M47816 Spondylosis without myelopathy or radiculopathy, lumbar region: Secondary | ICD-10-CM | POA: Diagnosis not present

## 2022-07-08 DIAGNOSIS — M8949 Other hypertrophic osteoarthropathy, multiple sites: Secondary | ICD-10-CM | POA: Diagnosis not present

## 2022-07-08 DIAGNOSIS — Z79899 Other long term (current) drug therapy: Secondary | ICD-10-CM | POA: Diagnosis not present

## 2022-07-08 DIAGNOSIS — M069 Rheumatoid arthritis, unspecified: Secondary | ICD-10-CM | POA: Diagnosis not present

## 2022-07-26 DIAGNOSIS — Z0389 Encounter for observation for other suspected diseases and conditions ruled out: Secondary | ICD-10-CM | POA: Diagnosis not present

## 2022-07-26 DIAGNOSIS — Z6830 Body mass index (BMI) 30.0-30.9, adult: Secondary | ICD-10-CM | POA: Diagnosis not present

## 2022-07-26 DIAGNOSIS — E785 Hyperlipidemia, unspecified: Secondary | ICD-10-CM | POA: Diagnosis not present

## 2022-07-26 DIAGNOSIS — D539 Nutritional anemia, unspecified: Secondary | ICD-10-CM | POA: Diagnosis not present

## 2022-07-26 DIAGNOSIS — Z1389 Encounter for screening for other disorder: Secondary | ICD-10-CM | POA: Diagnosis not present

## 2022-07-26 DIAGNOSIS — K2100 Gastro-esophageal reflux disease with esophagitis, without bleeding: Secondary | ICD-10-CM | POA: Diagnosis not present

## 2022-07-26 DIAGNOSIS — M069 Rheumatoid arthritis, unspecified: Secondary | ICD-10-CM | POA: Diagnosis not present

## 2022-07-26 DIAGNOSIS — R351 Nocturia: Secondary | ICD-10-CM | POA: Diagnosis not present

## 2022-07-26 DIAGNOSIS — Z0000 Encounter for general adult medical examination without abnormal findings: Secondary | ICD-10-CM | POA: Diagnosis not present

## 2022-07-26 DIAGNOSIS — G609 Hereditary and idiopathic neuropathy, unspecified: Secondary | ICD-10-CM | POA: Diagnosis not present

## 2022-07-26 DIAGNOSIS — Z01818 Encounter for other preprocedural examination: Secondary | ICD-10-CM | POA: Diagnosis not present

## 2022-07-26 DIAGNOSIS — N529 Male erectile dysfunction, unspecified: Secondary | ICD-10-CM | POA: Diagnosis not present

## 2022-08-06 ENCOUNTER — Ambulatory Visit: Payer: Medicare PPO

## 2022-08-06 DIAGNOSIS — M1712 Unilateral primary osteoarthritis, left knee: Secondary | ICD-10-CM

## 2022-08-11 ENCOUNTER — Other Ambulatory Visit (INDEPENDENT_AMBULATORY_CARE_PROVIDER_SITE_OTHER): Payer: Medicare PPO

## 2022-08-11 ENCOUNTER — Ambulatory Visit: Payer: Medicare PPO

## 2022-08-11 DIAGNOSIS — M1712 Unilateral primary osteoarthritis, left knee: Secondary | ICD-10-CM

## 2022-08-11 NOTE — Pre-Procedure Instructions (Signed)
Surgical Instructions   Your procedure is scheduled on August 23, 2022. Report to [facility] Main Entrance "A" at 9:30 A.M., then check in with the Admitting office. Any questions or running late day of surgery: call [phone]  Questions prior to your surgery date: call [phone], Monday-Friday, 8am-4pm. If you experience any cold or flu symptoms such as cough, fever, chills, shortness of breath, etc. between now and your scheduled surgery, please notify us at the above number.     Remember:  Do not eat after midnight the night before your surgery  You may drink clear liquids until 9:00 AM the morning of your surgery.   Clear liquids allowed are: Water, Non-Citrus Juices (without pulp), Carbonated Beverages, Clear Tea, Black Coffee Only (NO MILK, CREAM OR POWDERED CREAMER of any kind), and Gatorade.  Patient Instructions  The night before surgery:  No food after midnight. ONLY clear liquids after midnight  The day of surgery (if you do NOT have diabetes):  Drink ONE (1) Pre-Surgery Clear Ensure by 9:00 AM the morning of surgery. Drink in one sitting. Do not sip.  This drink was given to you during your hospital  pre-op appointment visit.  Nothing else to drink after completing the  Pre-Surgery Clear Ensure.         If you have questions, please contact your surgeon's office.     Take these medicines the morning of surgery with A SIP OF WATER: finasteride (PROSCAR)  hydroxychloroquine (PLAQUENIL)  ipratropium (ATROVENT) nasal spray  loratadine (CLARITIN)  omeprazole (PRILOSEC)  methotrexate (RHEUMATREX)    May take these medicines IF NEEDED: acetaminophen (TYLENOL)    One week prior to surgery, STOP taking any Aspirin (unless otherwise instructed by your surgeon) Aleve, Naproxen, Ibuprofen, Motrin, Advil, Goody's, BC's, all herbal medications, fish oil, and non-prescription vitamins.                      Do NOT Smoke (Tobacco/Vaping) for 24 hours prior to your  procedure.  If you use a CPAP at night, you may bring your mask/headgear for your overnight stay.   You will be asked to remove any contacts, glasses, piercing's, hearing aid's, dentures/partials prior to surgery. Please bring cases for these items if needed.    Patients discharged the day of surgery will not be allowed to drive home, and someone needs to stay with them for 24 hours.  SURGICAL WAITING ROOM VISITATION Patients may have no more than 2 support people in the waiting area - these visitors may rotate.   Pre-op nurse will coordinate an appropriate time for 1 ADULT support person, who may not rotate, to accompany patient in pre-op.  Children under the age of 50 must have an adult with them who is not the patient and must remain in the main waiting area with an adult.  If the patient needs to stay at the hospital during part of their recovery, the visitor guidelines for inpatient rooms apply.  Please refer to the [facility] website for the visitor guidelines for any additional information.   If you received a COVID test during your pre-op visit  it is requested that you wear a mask when out in public, stay away from anyone that may not be feeling well and notify your surgeon if you develop symptoms. If you have been in contact with anyone that has tested positive in the last 10 days please notify you surgeon.      Pre-operative 5 CHG Bathing Instructions  You can play a key role in reducing the risk of infection after surgery. Your skin needs to be as free of germs as possible. You can reduce the number of germs on your skin by washing with CHG (chlorhexidine gluconate) soap before surgery. CHG is an antiseptic soap that kills germs and continues to kill germs even after washing.   DO NOT use if you have an allergy to chlorhexidine/CHG or antibacterial soaps. If your skin becomes reddened or irritated, stop using the CHG and notify one of our RNs at [phone].   Please  shower with the CHG soap starting 4 days before surgery using the following schedule:     Please keep in mind the following:  DO NOT shave, including legs and underarms, starting the day of your first shower.   You may shave your face at any point before/day of surgery.  Place clean sheets on your bed the day you start using CHG soap. Use a clean washcloth (not used since being washed) for each shower. DO NOT sleep with pets once you start using the CHG.   CHG Shower Instructions:  If you choose to wash your hair and private area, wash first with your normal shampoo/soap.  After you use shampoo/soap, rinse your hair and body thoroughly to remove shampoo/soap residue.  Turn the water OFF and apply about 3 tablespoons (45 ml) of CHG soap to a CLEAN washcloth.  Apply CHG soap ONLY FROM YOUR NECK DOWN TO YOUR TOES (washing for 3-5 minutes)  DO NOT use CHG soap on face, private areas, open wounds, or sores.  Pay special attention to the area where your surgery is being performed.  If you are having back surgery, having someone wash your back for you may be helpful. Wait 2 minutes after CHG soap is applied, then you may rinse off the CHG soap.  Pat dry with a clean towel  Put on clean clothes/pajamas   If you choose to wear lotion, please use ONLY the CHG-compatible lotions on the back of this paper.   Additional instructions for the day of surgery: DO NOT APPLY any lotions, deodorants, cologne, or perfumes.   Do not bring valuables to the hospital. [facility] is not responsible for any belongings/valuables. Do not wear nail polish, gel polish, artificial nails, or any other type of covering on natural nails (fingers and toes) Do not wear jewelry or makeup Put on clean/comfortable clothes.  Please brush your teeth.  Ask your nurse before applying any prescription medications to the skin.     CHG Compatible Lotions   Aveeno Moisturizing lotion  Cetaphil Moisturizing Cream  Cetaphil  Moisturizing Lotion  Clairol Herbal Essence Moisturizing Lotion, Dry Skin  Clairol Herbal Essence Moisturizing Lotion, Extra Dry Skin  Clairol Herbal Essence Moisturizing Lotion, Normal Skin  Curel Age Defying Therapeutic Moisturizing Lotion with Alpha Hydroxy  Curel Extreme Care Body Lotion  Curel Soothing Hands Moisturizing Hand Lotion  Curel Therapeutic Moisturizing Cream, Fragrance-Free  Curel Therapeutic Moisturizing Lotion, Fragrance-Free  Curel Therapeutic Moisturizing Lotion, Original Formula  Eucerin Daily Replenishing Lotion  Eucerin Dry Skin Therapy Plus Alpha Hydroxy Crme  Eucerin Dry Skin Therapy Plus Alpha Hydroxy Lotion  Eucerin Original Crme  Eucerin Original Lotion  Eucerin Plus Crme Eucerin Plus Lotion  Eucerin TriLipid Replenishing Lotion  Keri Anti-Bacterial Hand Lotion  Keri Deep Conditioning Original Lotion Dry Skin Formula Softly Scented  Keri Deep Conditioning Original Lotion, Fragrance Free Sensitive Skin Formula  Keri Lotion Fast Absorbing Fragrance Free Sensitive  Skin Formula  Keri Lotion Fast Absorbing Softly Scented Dry Skin Formula  Keri Original Lotion  Keri Skin Renewal Lotion Keri Silky Smooth Lotion  Keri Silky Smooth Sensitive Skin Lotion  Nivea Body Creamy Conditioning Oil  Nivea Body Extra Enriched Lotion  Nivea Body Original Lotion  Nivea Body Sheer Moisturizing Lotion Nivea Crme  Nivea Skin Firming Lotion  NutraDerm 30 Skin Lotion  NutraDerm Skin Lotion  NutraDerm Therapeutic Skin Cream  NutraDerm Therapeutic Skin Lotion  ProShield Protective Hand Cream  Provon moisturizing lotion  Please read over the following fact sheets that you were given.

## 2022-08-12 ENCOUNTER — Encounter (HOSPITAL_COMMUNITY): Payer: Self-pay

## 2022-08-12 ENCOUNTER — Encounter (HOSPITAL_COMMUNITY)
Admission: RE | Admit: 2022-08-12 | Discharge: 2022-08-12 | Disposition: A | Discharge status: Home / Self Care | Payer: Medicare PPO | Source: Ambulatory Visit | Attending: Orthopaedic Surgery | Admitting: Orthopaedic Surgery

## 2022-08-12 ENCOUNTER — Other Ambulatory Visit: Payer: Self-pay

## 2022-08-12 VITALS — BP 151/68 | HR 65 | Temp 98.0°F | Resp 16 | Ht 68.0 in | Wt 201.0 lb

## 2022-08-12 DIAGNOSIS — Z01818 Encounter for other preprocedural examination: Secondary | ICD-10-CM

## 2022-08-12 DIAGNOSIS — Z01812 Encounter for preprocedural laboratory examination: Secondary | ICD-10-CM | POA: Diagnosis not present

## 2022-08-12 DIAGNOSIS — M1712 Unilateral primary osteoarthritis, left knee: Secondary | ICD-10-CM

## 2022-08-12 HISTORY — DX: Malignant (primary) neoplasm, unspecified: C80.1

## 2022-08-12 HISTORY — DX: Other complications of anesthesia, initial encounter: T88.59XA

## 2022-08-12 LAB — CBC
HCT: 36.9 % — ABNORMAL LOW (ref 39.0–52.0)
Hemoglobin: 12.5 g/dL — ABNORMAL LOW (ref 13.0–17.0)
MCH: 31.6 pg (ref 26.0–34.0)
MCHC: 33.9 g/dL (ref 30.0–36.0)
MCV: 93.2 fL (ref 80.0–100.0)
Platelets: 192 10*3/uL (ref 150–400)
RBC: 3.96 MIL/uL — ABNORMAL LOW (ref 4.22–5.81)
RDW: 13.5 % (ref 11.5–15.5)
WBC: 5 10*3/uL (ref 4.0–10.5)
nRBC: 0 % (ref 0.0–0.2)

## 2022-08-12 LAB — BASIC METABOLIC PANEL
Anion gap: 7 (ref 5–15)
BUN: 14 mg/dL (ref 8–23)
CO2: 25 mmol/L (ref 22–32)
Calcium: 8.5 mg/dL — ABNORMAL LOW (ref 8.9–10.3)
Chloride: 108 mmol/L (ref 98–111)
Creatinine, Ser: 0.81 mg/dL (ref 0.61–1.24)
GFR, Estimated: >60 mL/min (ref >60)
Glucose, Bld: 83 mg/dL (ref 70–99)
Potassium: 3.8 mmol/L (ref 3.5–5.1)
Sodium: 140 mmol/L (ref 135–145)

## 2022-08-12 LAB — SURGICAL PCR SCREEN
MRSA, PCR: NEGATIVE
Staphylococcus aureus: NEGATIVE

## 2022-08-12 NOTE — Progress Notes (Signed)
PCP - Dr. Herbie Drape (Primary in [place]) Dr. Kathie Rhodes is PCP in [place] at [facility]. Cardiologist - Denies  PPM/ICD - Denies Device Orders - n/a Rep Notified - n/a  Chest x-ray - July 2024 at [facility] PCP for routine exam. Result requested EKG - July 2024 at [facility] PCP for routine exam. Result requested Stress Test - Denies ECHO - Denies Cardiac Cath - Denies  Sleep Study - Denies CPAP - n/a  No DM  Last dose of GLP1 agonist- n/a GLP1 instructions: n/a  Blood Thinner Instructions: n/a Aspirin Instructions: n/a  ERAS Protcol - Clear liquids until 0900 morning of surgery PRE-SURGERY Ensure or G2- Ensure given to pt with instructions  COVID TEST- n/a   Anesthesia review: Yes. Last office note, EKG tracing and CXR results requested from [facility] PCP.   Patient denies shortness of breath, fever, cough and chest pain at PAT appointment. Pt denies any respiratory illness/infection in the last two months.   All instructions explained to the patient, with a verbal understanding of the material. Patient agrees to go over the instructions while at home for a better understanding. Patient also instructed to self quarantine after being tested for COVID-19. The opportunity to ask questions was provided.

## 2022-08-16 ENCOUNTER — Other Ambulatory Visit: Payer: Self-pay | Admitting: Physician Assistant

## 2022-08-16 ENCOUNTER — Ambulatory Visit: Payer: Medicare PPO | Admitting: Family Medicine

## 2022-08-16 ENCOUNTER — Encounter: Payer: Self-pay | Admitting: Family Medicine

## 2022-08-16 VITALS — BP 118/66 | HR 64 | Temp 97.9°F | Ht 68.0 in | Wt 200.8 lb

## 2022-08-16 DIAGNOSIS — J310 Chronic rhinitis: Secondary | ICD-10-CM | POA: Diagnosis not present

## 2022-08-16 DIAGNOSIS — M1712 Unilateral primary osteoarthritis, left knee: Secondary | ICD-10-CM

## 2022-08-16 DIAGNOSIS — M069 Rheumatoid arthritis, unspecified: Secondary | ICD-10-CM | POA: Diagnosis not present

## 2022-08-16 MED ORDER — METHOCARBAMOL 500 MG PO TABS
500.0000 mg | ORAL_TABLET | Freq: Two times a day (BID) | ORAL | 2 refills | Status: DC | PRN
Start: 2022-08-16 — End: 2022-08-24

## 2022-08-16 MED ORDER — OXYCODONE-ACETAMINOPHEN 5-325 MG PO TABS: 1.0000 | ORAL_TABLET | Freq: Four times a day (QID) | ORAL | 0 refills | Status: DC | PRN

## 2022-08-16 MED ORDER — ONDANSETRON HCL 4 MG PO TABS: 4.0000 mg | ORAL_TABLET | Freq: Three times a day (TID) | ORAL | 0 refills | Status: AC | PRN

## 2022-08-16 MED ORDER — ASPIRIN 81 MG PO TBEC: 81.0000 mg | DELAYED_RELEASE_TABLET | Freq: Two times a day (BID) | ORAL | 0 refills | Status: AC

## 2022-08-16 MED ORDER — DOCUSATE SODIUM 100 MG PO CAPS: 100.0000 mg | ORAL_CAPSULE | Freq: Every day | ORAL | 2 refills | Status: AC | PRN

## 2022-08-16 NOTE — Progress Notes (Signed)
[facility] [address] [place] [address] Dept: [phone] Dept Fax: [phone]  Chronic Care Office Visit  Subjective:    Patient ID: Joshua Ruiz, male    DOB: 11/23/1940, 82 y.o..   MRN: [id number]  Chief Complaint  Patient presents with   Follow-up    6 month f/u .  No concerns.    History of Present Illness:  Patient is in today for reassessment of chronic medical issues.  Joshua Ruiz has a history of rheumatoid arthritis. He feels this has been doing well overall. He continues to be managed on methotrexate 15 mg every Saturday and hydroxychloroquine (Plaquenil) 200 mg bid.  Joshua Ruiz has a history of OA of the left knee. He is scheduled for a total left knee joint replacement in 1 week. He notes his son and daughter-in-law will be available to provide assistance for him.   Joshua Ruiz has a history of chronic rhinitis. He notes he gets an annual physical exam performed by an internist in [place]. He was recently provided an additional nasal spray, though he does not recall the name of it. I had previously prescribed azelastine spray. He feels the current spray is working for him.  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   Lumbar spondylosis 10/20/2021   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-L5 level 10/20/2021   Primary osteoarthritis of left knee 08/14/2021   Chronic epididymitis 08/13/2020   Hives 07/11/2020   Rheumatoid arthritis (HCC) 07/11/2020   Cervical arthritis 07/11/2020   Gastroesophageal reflux disease 07/11/2020   Presbyopia of both eyes 04/20/2015   Pseudophakia of both eyes 04/20/2015   Past Surgical History:  Procedure Laterality Date   COLONOSCOPY  2021   EYE SURGERY Bilateral    cataract  extraction   HEMORRHOID SURGERY     LAMINECTOMY WITH POSTERIOR LATERAL ARTHRODESIS LEVEL 3 N/A 11/20/2021   Procedure: LUMBAR TWO THROUGH LUMBAR THREE, LUMBAR THREE THROUGH LUMBAR FOUR, LUMBAR FOUR THROUGH LUMBAR FIVE LAMINECTOMY WITH FACETECTOMY, POSTERIOR SEGMENTAL INSTRUMENTATION, POSTEROLATERAL ARTHRODESIS;  Surgeon: Joshua Renshaw, MD;  Location: [facility] OR;  Service: Neurosurgery;  Laterality: N/A;  3C   LYMPH NODE BIOPSY     Neck   MOHS SURGERY Left 2016   Ear. [facility] in [place]   TRIGGER FINGER RELEASE Right    3rd   Family History  Problem Relation Age of Onset   Rheum arthritis Mother    Diabetes Mother    Cancer Sister        Lung   Cancer Brother        Lung   Outpatient Medications Prior to Visit  Medication Sig Dispense Refill   acetaminophen (TYLENOL) 325 MG tablet Take 650 mg by mouth every 6 (six) hours as needed for moderate pain.     azelastine (ASTELIN) 0.1 % nasal spray Place 1 spray into both nostrils 2 (two) times daily. 30 mL 12   cetirizine (ZYRTEC) 10 MG tablet Take 10 mg by mouth at bedtime.     finasteride (PROSCAR) 5 MG tablet Take 5 mg by mouth daily.     fluticasone (FLONASE) 50 MCG/ACT nasal spray Place into both nostrils.     folic acid (FOLVITE) 1 MG tablet Take 1 mg by mouth daily.     hydroxychloroquine (PLAQUENIL) 200 MG tablet Take 200 mg by  mouth 2 (two) times daily.     ipratropium (ATROVENT) 0.06 % nasal spray Place 1 spray into both nostrils 2 (two) times daily.     loratadine (CLARITIN) 10 MG tablet Take 10 mg by mouth in the morning.     methocarbamol (ROBAXIN) 500 MG tablet Take 1 tablet (500 mg total) by mouth every 6 (six) hours as needed for muscle spasms. 90 tablet 0   methotrexate (RHEUMATREX) 2.5 MG tablet Take 15 mg by mouth every Saturday. Caution:Chemotherapy. Protect from light.     mometasone (ELOCON) 0.1 % cream Apply topically daily. 15 g 1   Multiple Vitamin (MULTIVITAMIN WITH MINERALS) TABS tablet Take 1 tablet  by mouth daily.     Multiple Vitamins-Minerals (PRESERVISION AREDS 2) CAPS Take 1 capsule by mouth in the morning and at bedtime.     naproxen sodium (ALEVE) 220 MG tablet Take 220 mg by mouth daily as needed (pain).     omeprazole (PRILOSEC) 20 MG capsule Take 20 mg by mouth in the morning and at bedtime.     sildenafil (VIAGRA) 100 MG tablet Take 100 mg by mouth daily as needed.     tadalafil (CIALIS) 5 MG tablet Take 5 mg by mouth daily.     No facility-administered medications prior to visit.   No Known Allergies Objective:   Today's Vitals   08/16/22 0955  BP: 118/66  Pulse: 64  Temp: 97.9 F (36.6 C)  TempSrc: Temporal  SpO2: 99%  Weight: 200 lb 12.8 oz (91.1 kg)  Height: 5\' 8"  (1.727 m)   Body mass index is 30.53 kg/m.   General: Well developed, well nourished. No acute distress. Extremities: Full ROM. No joint swelling, tenderness or increased warmth int he hands or wrists. Psych: Alert and oriented. Normal mood and affect.  Health Maintenance Due  Topic Date Due   DTaP/Tdap/Td (1 - Tdap) Never done     Assessment & Plan:   Problem List Items Addressed This Visit       Respiratory   Chronic rhinitis    Using a new nasal spray (? ipratropium) . I asked that he confirm for [place] what his current medicine is.        Musculoskeletal and Integument   Primary osteoarthritis of left knee - Primary    Follow-up with Joshua Ruiz for total left knee joint replacement as scheduled.      Rheumatoid arthritis (HCC)    Stable. Continue methotrexate 15 mg every Saturday and hydroxychloroquine (Plaquenil) 200 mg bid. He will cotninue to follow for monitoring of this with Joshua Ruiz.       Return in about 1 year (around 08/16/2023) for Annual preventative care.   Loyola Mast, MD

## 2022-08-16 NOTE — Assessment & Plan Note (Signed)
Using a new nasal spray (? ipratropium) . I asked that he confirm for [place] what his current medicine is.

## 2022-08-16 NOTE — Assessment & Plan Note (Signed)
Follow-up with Dr. Roda Shutters for total left knee joint replacement as scheduled.

## 2022-08-16 NOTE — Assessment & Plan Note (Signed)
Stable. Continue methotrexate 15 mg every Saturday and hydroxychloroquine (Plaquenil) 200 mg bid. He will cotninue to follow for monitoring of this with Dr. Baldwin [place].

## 2022-08-18 ENCOUNTER — Other Ambulatory Visit: Payer: Self-pay | Admitting: Physician Assistant

## 2022-08-20 ENCOUNTER — Telehealth: Payer: Self-pay | Admitting: *Deleted

## 2022-08-20 DIAGNOSIS — M1712 Unilateral primary osteoarthritis, left knee: Secondary | ICD-10-CM

## 2022-08-20 MED ORDER — TRANEXAMIC ACID 1000 MG/10ML IV SOLN
2000.0000 mg | INTRAVENOUS | Status: DC
  Filled 2022-08-20: qty 20

## 2022-08-20 NOTE — Care Plan (Signed)
[facility] RNCM call to patient to discuss his upcoming Left total knee arthroplasty with Dr. Roda Shutters on Monday, 08/23/22. He is an Ortho bundle patient through [facility] and is agreeable to case management. He lives alone, but has a son and daughter in law that will be staying with him after discharge. He has a RW and has been delivered a home CPM by [employer] prior to surgery. He will need HHPT after a short hospital stay. Referral requested to be sent to [facility]. This has been sent. Reviewed post op care instructions. Will continue to follow for needs.

## 2022-08-20 NOTE — Telephone Encounter (Signed)
Attempted pre-op call to patient. No answer and left VM requesting call back.

## 2022-08-20 NOTE — Telephone Encounter (Signed)
Ortho bundle pre-op call completed. 

## 2022-08-23 ENCOUNTER — Observation Stay (HOSPITAL_COMMUNITY): Payer: Medicare PPO

## 2022-08-23 ENCOUNTER — Other Ambulatory Visit: Payer: Self-pay

## 2022-08-23 ENCOUNTER — Ambulatory Visit (HOSPITAL_BASED_OUTPATIENT_CLINIC_OR_DEPARTMENT_OTHER): Payer: Medicare PPO | Admitting: Registered Nurse

## 2022-08-23 ENCOUNTER — Ambulatory Visit (HOSPITAL_COMMUNITY): Payer: Medicare PPO | Admitting: Physician Assistant

## 2022-08-23 ENCOUNTER — Encounter (HOSPITAL_COMMUNITY)
Admission: RE | Disposition: A | Discharge status: Home / Self Care | Payer: Self-pay | Source: Ambulatory Visit | Attending: Orthopaedic Surgery

## 2022-08-23 ENCOUNTER — Encounter (HOSPITAL_COMMUNITY): Payer: Self-pay | Admitting: Orthopaedic Surgery

## 2022-08-23 ENCOUNTER — Observation Stay (HOSPITAL_COMMUNITY)
Admission: RE | Admit: 2022-08-23 | Discharge: 2022-08-24 | Disposition: A | Discharge status: Home / Self Care | Payer: Medicare PPO | Source: Ambulatory Visit | Attending: Orthopaedic Surgery | Admitting: Orthopaedic Surgery

## 2022-08-23 DIAGNOSIS — G8918 Other acute postprocedural pain: Secondary | ICD-10-CM | POA: Diagnosis not present

## 2022-08-23 DIAGNOSIS — Z85828 Personal history of other malignant neoplasm of skin: Secondary | ICD-10-CM | POA: Insufficient documentation

## 2022-08-23 DIAGNOSIS — Z96652 Presence of left artificial knee joint: Secondary | ICD-10-CM

## 2022-08-23 DIAGNOSIS — M1712 Unilateral primary osteoarthritis, left knee: Secondary | ICD-10-CM | POA: Diagnosis not present

## 2022-08-23 DIAGNOSIS — M48062 Spinal stenosis, lumbar region with neurogenic claudication: Secondary | ICD-10-CM | POA: Diagnosis not present

## 2022-08-23 DIAGNOSIS — Z471 Aftercare following joint replacement surgery: Secondary | ICD-10-CM | POA: Diagnosis not present

## 2022-08-23 HISTORY — PX: TOTAL KNEE ARTHROPLASTY: SHX125

## 2022-08-23 SURGERY — ARTHROPLASTY, KNEE, TOTAL
Anesthesia: Regional | Site: Knee | Laterality: Left

## 2022-08-23 MED ORDER — ACETAMINOPHEN 10 MG/ML IV SOLN
INTRAVENOUS | Status: AC
  Filled 2022-08-23: qty 100

## 2022-08-23 MED ORDER — ONDANSETRON HCL 4 MG/2ML IJ SOLN: 4.0000 mg | Freq: Four times a day (QID) | INTRAMUSCULAR | Status: DC | PRN

## 2022-08-23 MED ORDER — FENTANYL CITRATE (PF) 100 MCG/2ML IJ SOLN
25.0000 ug | INTRAMUSCULAR | Status: DC | PRN
  Administered 2022-08-23: 25 ug via INTRAVENOUS

## 2022-08-23 MED ORDER — CHLORHEXIDINE GLUCONATE 0.12 % MT SOLN
15.0000 mL | Freq: Once | OROMUCOSAL | Status: AC
  Administered 2022-08-23: 15 mL via OROMUCOSAL
  Filled 2022-08-23: qty 15

## 2022-08-23 MED ORDER — LACTATED RINGERS IV SOLN: INTRAVENOUS | Status: DC

## 2022-08-23 MED ORDER — TRANEXAMIC ACID-NACL 1000-0.7 MG/100ML-% IV SOLN
1000.0000 mg | Freq: Once | INTRAVENOUS | Status: AC
  Administered 2022-08-23: 1000 mg via INTRAVENOUS
  Filled 2022-08-23: qty 100

## 2022-08-23 MED ORDER — OXYCODONE HCL 5 MG PO TABS: 5.0000 mg | ORAL_TABLET | Freq: Once | ORAL | Status: DC | PRN

## 2022-08-23 MED ORDER — BUPIVACAINE-MELOXICAM ER 400-12 MG/14ML IJ SOLN
INTRAMUSCULAR | Status: AC
  Filled 2022-08-23: qty 1

## 2022-08-23 MED ORDER — ONDANSETRON HCL 4 MG PO TABS: 4.0000 mg | ORAL_TABLET | Freq: Four times a day (QID) | ORAL | Status: DC | PRN

## 2022-08-23 MED ORDER — DEXAMETHASONE SODIUM PHOSPHATE 10 MG/ML IJ SOLN
10.0000 mg | Freq: Once | INTRAMUSCULAR | Status: AC
  Administered 2022-08-24: 10 mg via INTRAVENOUS
  Filled 2022-08-23: qty 1

## 2022-08-23 MED ORDER — BUPIVACAINE HCL (PF) 0.5 % IJ SOLN
INTRAMUSCULAR | Status: DC | PRN
  Administered 2022-08-23: 25 mL via PERINEURAL

## 2022-08-23 MED ORDER — OXYCODONE HCL ER 10 MG PO T12A
10.0000 mg | EXTENDED_RELEASE_TABLET | Freq: Two times a day (BID) | ORAL | Status: DC
  Administered 2022-08-23 (×2): 10 mg via ORAL
  Filled 2022-08-23 (×3): qty 1

## 2022-08-23 MED ORDER — METHOCARBAMOL 500 MG PO TABS: 500.0000 mg | ORAL_TABLET | Freq: Four times a day (QID) | ORAL | Status: DC | PRN

## 2022-08-23 MED ORDER — ORAL CARE MOUTH RINSE: 15.0000 mL | Freq: Once | OROMUCOSAL | Status: AC

## 2022-08-23 MED ORDER — PRONTOSAN WOUND IRRIGATION OPTIME
TOPICAL | Status: DC | PRN
  Administered 2022-08-23: 1

## 2022-08-23 MED ORDER — ACETAMINOPHEN 325 MG PO TABS: 325.0000 mg | ORAL_TABLET | Freq: Four times a day (QID) | ORAL | Status: DC | PRN

## 2022-08-23 MED ORDER — TRANEXAMIC ACID 1000 MG/10ML IV SOLN
INTRAVENOUS | Status: DC | PRN
  Administered 2022-08-23: 2000 mg via TOPICAL

## 2022-08-23 MED ORDER — VANCOMYCIN HCL 1000 MG IV SOLR
INTRAVENOUS | Status: AC
  Filled 2022-08-23: qty 20

## 2022-08-23 MED ORDER — 0.9 % SODIUM CHLORIDE (POUR BTL) OPTIME
TOPICAL | Status: DC | PRN
  Administered 2022-08-23: 1000 mL

## 2022-08-23 MED ORDER — POVIDONE-IODINE 10 % EX SWAB
2.0000 | Freq: Once | CUTANEOUS | Status: AC
  Administered 2022-08-23: 2 via TOPICAL

## 2022-08-23 MED ORDER — LIDOCAINE 2% (20 MG/ML) 5 ML SYRINGE
INTRAMUSCULAR | Status: DC | PRN
  Administered 2022-08-23: 60 mg via INTRAVENOUS

## 2022-08-23 MED ORDER — CEFAZOLIN SODIUM-DEXTROSE 2-4 GM/100ML-% IV SOLN
2.0000 g | INTRAVENOUS | Status: AC
  Administered 2022-08-23: 2 g via INTRAVENOUS
  Filled 2022-08-23: qty 100

## 2022-08-23 MED ORDER — FENTANYL CITRATE (PF) 250 MCG/5ML IJ SOLN
INTRAMUSCULAR | Status: AC
  Filled 2022-08-23: qty 5

## 2022-08-23 MED ORDER — ONDANSETRON HCL 4 MG/2ML IJ SOLN
INTRAMUSCULAR | Status: DC | PRN
  Administered 2022-08-23: 4 mg via INTRAVENOUS

## 2022-08-23 MED ORDER — FENTANYL CITRATE (PF) 100 MCG/2ML IJ SOLN
INTRAMUSCULAR | Status: AC
  Filled 2022-08-23: qty 2

## 2022-08-23 MED ORDER — PROPOFOL 10 MG/ML IV BOLUS
INTRAVENOUS | Status: DC | PRN
  Administered 2022-08-23: 200 mg via INTRAVENOUS

## 2022-08-23 MED ORDER — OXYCODONE HCL 5 MG/5ML PO SOLN: 5.0000 mg | Freq: Once | ORAL | Status: DC | PRN

## 2022-08-23 MED ORDER — DOCUSATE SODIUM 100 MG PO CAPS
100.0000 mg | ORAL_CAPSULE | Freq: Two times a day (BID) | ORAL | Status: DC
  Administered 2022-08-23 (×2): 100 mg via ORAL
  Filled 2022-08-23 (×3): qty 1

## 2022-08-23 MED ORDER — FERROUS SULFATE 325 (65 FE) MG PO TABS
325.0000 mg | ORAL_TABLET | Freq: Three times a day (TID) | ORAL | Status: DC
  Administered 2022-08-23 (×2): 325 mg via ORAL
  Filled 2022-08-23 (×3): qty 1

## 2022-08-23 MED ORDER — OXYCODONE HCL 5 MG PO TABS: 10.0000 mg | ORAL_TABLET | ORAL | Status: DC | PRN

## 2022-08-23 MED ORDER — CEFAZOLIN SODIUM-DEXTROSE 2-4 GM/100ML-% IV SOLN
2.0000 g | Freq: Four times a day (QID) | INTRAVENOUS | Status: AC
  Administered 2022-08-23 (×2): 2 g via INTRAVENOUS
  Filled 2022-08-23 (×2): qty 100

## 2022-08-23 MED ORDER — FENTANYL CITRATE (PF) 100 MCG/2ML IJ SOLN
INTRAMUSCULAR | Status: AC
  Administered 2022-08-23: 50 ug via INTRAVENOUS
  Filled 2022-08-23: qty 2

## 2022-08-23 MED ORDER — MENTHOL 3 MG MT LOZG: 1.0000 | LOZENGE | OROMUCOSAL | Status: DC | PRN

## 2022-08-23 MED ORDER — METOCLOPRAMIDE HCL 5 MG/ML IJ SOLN: 5.0000 mg | Freq: Three times a day (TID) | INTRAMUSCULAR | Status: DC | PRN

## 2022-08-23 MED ORDER — PHENOL 1.4 % MT LIQD: 1.0000 | OROMUCOSAL | Status: DC | PRN

## 2022-08-23 MED ORDER — FENTANYL CITRATE (PF) 100 MCG/2ML IJ SOLN: 50.0000 ug | Freq: Once | INTRAMUSCULAR | Status: AC

## 2022-08-23 MED ORDER — ACETAMINOPHEN 10 MG/ML IV SOLN
INTRAVENOUS | Status: DC | PRN
  Administered 2022-08-23: 1000 mg via INTRAVENOUS

## 2022-08-23 MED ORDER — VANCOMYCIN HCL 1000 MG IV SOLR
INTRAVENOUS | Status: DC | PRN
  Administered 2022-08-23: 1000 mg via TOPICAL

## 2022-08-23 MED ORDER — METOCLOPRAMIDE HCL 5 MG PO TABS: 5.0000 mg | ORAL_TABLET | Freq: Three times a day (TID) | ORAL | Status: DC | PRN

## 2022-08-23 MED ORDER — KETOROLAC TROMETHAMINE 15 MG/ML IJ SOLN
7.5000 mg | Freq: Four times a day (QID) | INTRAMUSCULAR | Status: AC
  Administered 2022-08-23 – 2022-08-24 (×4): 7.5 mg via INTRAVENOUS
  Filled 2022-08-23 (×4): qty 1

## 2022-08-23 MED ORDER — OXYCODONE HCL 5 MG PO TABS
5.0000 mg | ORAL_TABLET | ORAL | Status: DC | PRN
  Administered 2022-08-23: 5 mg via ORAL
  Filled 2022-08-23: qty 1
  Filled 2022-08-23: qty 2

## 2022-08-23 MED ORDER — PHENYLEPHRINE HCL-NACL 20-0.9 MG/250ML-% IV SOLN
INTRAVENOUS | Status: DC | PRN
  Administered 2022-08-23: 50 ug/min via INTRAVENOUS

## 2022-08-23 MED ORDER — TRANEXAMIC ACID-NACL 1000-0.7 MG/100ML-% IV SOLN
1000.0000 mg | INTRAVENOUS | Status: AC
  Administered 2022-08-23: 1000 mg via INTRAVENOUS
  Filled 2022-08-23: qty 100

## 2022-08-23 MED ORDER — ASPIRIN 81 MG PO CHEW
81.0000 mg | CHEWABLE_TABLET | Freq: Two times a day (BID) | ORAL | Status: DC
  Administered 2022-08-23 – 2022-08-24 (×2): 81 mg via ORAL
  Filled 2022-08-23 (×2): qty 1

## 2022-08-23 MED ORDER — FENTANYL CITRATE (PF) 250 MCG/5ML IJ SOLN
INTRAMUSCULAR | Status: DC | PRN
  Administered 2022-08-23 (×2): 25 ug via INTRAVENOUS
  Administered 2022-08-23 (×3): 50 ug via INTRAVENOUS

## 2022-08-23 MED ORDER — MIDAZOLAM HCL 2 MG/2ML IJ SOLN
INTRAMUSCULAR | Status: AC
  Filled 2022-08-23: qty 2

## 2022-08-23 MED ORDER — METHOCARBAMOL 1000 MG/10ML IJ SOLN: 500.0000 mg | Freq: Four times a day (QID) | INTRAVENOUS | Status: DC | PRN

## 2022-08-23 MED ORDER — HYDROMORPHONE HCL 1 MG/ML IJ SOLN: 0.5000 mg | INTRAMUSCULAR | Status: DC | PRN

## 2022-08-23 MED ORDER — SODIUM CHLORIDE 0.9 % IV SOLN: INTRAVENOUS | Status: DC

## 2022-08-23 MED ORDER — SODIUM CHLORIDE 0.9 % IR SOLN
Status: DC | PRN
  Administered 2022-08-23: 1000 mL

## 2022-08-23 MED ORDER — BUPIVACAINE-MELOXICAM ER 400-12 MG/14ML IJ SOLN
INTRAMUSCULAR | Status: DC | PRN
  Administered 2022-08-23: 400 mg

## 2022-08-23 MED ORDER — ACETAMINOPHEN 500 MG PO TABS
1000.0000 mg | ORAL_TABLET | Freq: Four times a day (QID) | ORAL | Status: DC
  Administered 2022-08-23 – 2022-08-24 (×3): 1000 mg via ORAL
  Filled 2022-08-23 (×4): qty 2

## 2022-08-23 MED ORDER — DEXAMETHASONE SODIUM PHOSPHATE 10 MG/ML IJ SOLN
INTRAMUSCULAR | Status: DC | PRN
  Administered 2022-08-23: 5 mg via INTRAVENOUS

## 2022-08-23 SURGICAL SUPPLY — 88 items
ADH SKN CLS APL DERMABOND .7 (GAUZE/BANDAGES/DRESSINGS) ×1
ALCOHOL 70% 16 OZ (MISCELLANEOUS) ×1 IMPLANT
BAG COUNTER SPONGE SURGICOUNT (BAG) IMPLANT
BAG DECANTER FOR FLEXI CONT (MISCELLANEOUS) ×1 IMPLANT
BAG SPNG CNTER NS LX DISP (BAG) ×1
BANDAGE ESMARK 6X9 LF (GAUZE/BANDAGES/DRESSINGS) IMPLANT
BIT DRILL QUICK REL 1/8 2PK SL (BIT) IMPLANT
BLADE SAG 18X100X1.27 (BLADE) ×1 IMPLANT
BLADE SAW SGTL 73X25 THK (BLADE) ×1 IMPLANT
BNDG CMPR 5X6 CHSV STRCH STRL (GAUZE/BANDAGES/DRESSINGS) ×1
BNDG CMPR 9X6 STRL LF SNTH (GAUZE/BANDAGES/DRESSINGS)
BNDG COHESIVE 6X5 TAN ST LF (GAUZE/BANDAGES/DRESSINGS) IMPLANT
BNDG ESMARK 6X9 LF (GAUZE/BANDAGES/DRESSINGS)
BOWL SMART MIX CTS (DISPOSABLE) ×1 IMPLANT
CEMENT BONE REFOBACIN R1X40 US (Cement) IMPLANT
CLSR STERI-STRIP ANTIMIC 1/2X4 (GAUZE/BANDAGES/DRESSINGS) ×2 IMPLANT
COMP FEM KNEE STD PS 9 LT (Joint) ×1 IMPLANT
COMP TIB PS G 0D LT (Joint) ×1 IMPLANT
COMPONENT FEM KNEE STD PS 9 LT (Joint) IMPLANT
COMPONET TIB PS G 0D LT (Joint) IMPLANT
COOLER ICEMAN CLASSIC (MISCELLANEOUS) ×1 IMPLANT
COVER SURGICAL LIGHT HANDLE (MISCELLANEOUS) ×1 IMPLANT
CUFF TOURN SGL QUICK 34 (TOURNIQUET CUFF) ×1
CUFF TOURN SGL QUICK 42 (TOURNIQUET CUFF) IMPLANT
CUFF TRNQT CYL 34X4.125X (TOURNIQUET CUFF) ×1 IMPLANT
DERMABOND ADVANCED .7 DNX12 (GAUZE/BANDAGES/DRESSINGS) ×1 IMPLANT
DRAPE EXTREMITY T 121X128X90 (DISPOSABLE) ×1 IMPLANT
DRAPE HALF SHEET 40X57 (DRAPES) ×1 IMPLANT
DRAPE INCISE IOBAN 66X45 STRL (DRAPES) ×1 IMPLANT
DRAPE ORTHO SPLIT 77X108 STRL (DRAPES)
DRAPE POUCH INSTRU U-SHP 10X18 (DRAPES) ×1 IMPLANT
DRAPE SURG ORHT 6 SPLT 77X108 (DRAPES) IMPLANT
DRAPE U-SHAPE 47X51 STRL (DRAPES) ×2 IMPLANT
DRSG AQUACEL AG ADV 3.5X10 (GAUZE/BANDAGES/DRESSINGS) ×1 IMPLANT
DURAPREP 26ML APPLICATOR (WOUND CARE) ×3 IMPLANT
ELECT CAUTERY BLADE 6.4 (BLADE) ×1 IMPLANT
ELECT PENCIL ROCKER SW 15FT (MISCELLANEOUS) ×1 IMPLANT
ELECT REM PT RETURN 9FT ADLT (ELECTROSURGICAL) ×1
ELECTRODE REM PT RTRN 9FT ADLT (ELECTROSURGICAL) ×1 IMPLANT
GLOVE BIOGEL PI IND STRL 7.0 (GLOVE) ×2 IMPLANT
GLOVE BIOGEL PI IND STRL 7.5 (GLOVE) ×5 IMPLANT
GLOVE ECLIPSE 7.0 STRL STRAW (GLOVE) ×3 IMPLANT
GLOVE INDICATOR 7.0 STRL GRN (GLOVE) ×1 IMPLANT
GLOVE INDICATOR 7.5 STRL GRN (GLOVE) ×1 IMPLANT
GLOVE SURG SYN 7.5 E (GLOVE) ×2 IMPLANT
GLOVE SURG SYN 7.5 PF PI (GLOVE) ×2 IMPLANT
GLOVE SURG UNDER LTX SZ7.5 (GLOVE) ×2 IMPLANT
GLOVE SURG UNDER POLY LF SZ7 (GLOVE) ×2 IMPLANT
GOWN STRL REUS W/ TWL LRG LVL3 (GOWN DISPOSABLE) ×1 IMPLANT
GOWN STRL REUS W/TWL LRG LVL3 (GOWN DISPOSABLE) ×1
GOWN STRL SURGICAL XL XLNG (GOWN DISPOSABLE) ×1 IMPLANT
GOWN TOGA ZIPPER T7+ PEEL AWAY (MISCELLANEOUS) ×2 IMPLANT
HANDPIECE INTERPULSE COAX TIP (DISPOSABLE) ×1
HOOD PEEL AWAY T7 (MISCELLANEOUS) ×1 IMPLANT
INSERT TIBIA ARTIC SZ 8-11 13 (Joint) IMPLANT
KIT BASIN OR (CUSTOM PROCEDURE TRAY) ×1 IMPLANT
KIT TURNOVER KIT B (KITS) ×1 IMPLANT
MANIFOLD NEPTUNE II (INSTRUMENTS) ×1 IMPLANT
MARKER SKIN DUAL TIP RULER LAB (MISCELLANEOUS) ×2 IMPLANT
NDL SPNL 18GX3.5 QUINCKE PK (NEEDLE) ×1 IMPLANT
NEEDLE SPNL 18GX3.5 QUINCKE PK (NEEDLE) ×1 IMPLANT
NS IRRIG 1000ML POUR BTL (IV SOLUTION) ×1 IMPLANT
PACK TOTAL JOINT (CUSTOM PROCEDURE TRAY) ×1 IMPLANT
PAD ARMBOARD 7.5X6 YLW CONV (MISCELLANEOUS) ×2 IMPLANT
PAD COLD SHLDR WRAP-ON (PAD) ×1 IMPLANT
PIN DRILL HDLS TROCAR 75 4PK (PIN) IMPLANT
SCREW FEMALE HEX FIX 25X2.5 (ORTHOPEDIC DISPOSABLE SUPPLIES) IMPLANT
SET HNDPC FAN SPRY TIP SCT (DISPOSABLE) ×1 IMPLANT
SOLUTION PRONTOSAN WOUND 350ML (IRRIGATION / IRRIGATOR) ×1 IMPLANT
STAPLER VISISTAT 35W (STAPLE) IMPLANT
STEM POLY PAT PLY 35M KNEE (Knees) IMPLANT
STOCKINETTE IMPERVIOUS LG (DRAPES) IMPLANT
SUCTION TUBE FRAZIER 10FR DISP (SUCTIONS) ×1 IMPLANT
SUT ETHILON 2 0 FS 18 (SUTURE) IMPLANT
SUT MNCRL AB 3-0 PS2 27 (SUTURE) IMPLANT
SUT VIC AB 0 CT1 27 (SUTURE) ×3
SUT VIC AB 0 CT1 27XBRD ANBCTR (SUTURE) ×2 IMPLANT
SUT VIC AB 1 CTX 27 (SUTURE) ×3 IMPLANT
SUT VIC AB 2-0 CT1 27 (SUTURE) ×5
SUT VIC AB 2-0 CT1 TAPERPNT 27 (SUTURE) ×4 IMPLANT
SYR 50ML LL SCALE MARK (SYRINGE) ×2 IMPLANT
TOWEL GREEN STERILE (TOWEL DISPOSABLE) ×1 IMPLANT
TOWEL GREEN STERILE FF (TOWEL DISPOSABLE) ×1 IMPLANT
TRAY CATH INTERMITTENT SS 16FR (CATHETERS) IMPLANT
TUBE SUCT ARGYLE STRL (TUBING) ×1 IMPLANT
UNDERPAD 30X36 HEAVY ABSORB (UNDERPADS AND DIAPERS) ×1 IMPLANT
WARMER LAPAROSCOPE (MISCELLANEOUS) IMPLANT
YANKAUER SUCT BULB TIP NO VENT (SUCTIONS) ×2 IMPLANT

## 2022-08-23 NOTE — Anesthesia Procedure Notes (Signed)
Procedure Name: LMA Insertion Date/Time: 08/23/2022 9:44 AM  Performed by: Loleta , CRNAPre-anesthesia Checklist: Patient identified, Patient being monitored, Timeout performed, Emergency Drugs available and Suction available Patient Re-evaluated:Patient Re-evaluated prior to induction Oxygen Delivery Method: Circle system utilized Preoxygenation: Pre-oxygenation with 100% oxygen Induction Type: IV induction Ventilation: Mask ventilation without difficulty LMA: LMA inserted LMA Size: 5.0 Tube type: Oral Number of attempts: 1 Placement Confirmation: positive ETCO2 and breath sounds checked- equal and bilateral Tube secured with: Tape Dental Injury: Teeth and Oropharynx as per pre-operative assessment

## 2022-08-23 NOTE — Discharge Instructions (Signed)

## 2022-08-23 NOTE — Anesthesia Preprocedure Evaluation (Signed)
Anesthesia Evaluation  Patient identified by MRN, date of birth, ID band Patient awake    Reviewed: Allergy & Precautions, H&P , NPO status , Patient's Chart, lab work & pertinent test results  Airway Mallampati: II   Neck ROM: full    Dental   Pulmonary neg pulmonary ROS   breath sounds clear to auscultation       Cardiovascular negative cardio ROS  Rhythm:regular Rate:Normal     Neuro/Psych    GI/Hepatic ,GERD  ,,  Endo/Other    Renal/GU      Musculoskeletal  (+) Arthritis ,    Abdominal   Peds  Hematology   Anesthesia Other Findings   Reproductive/Obstetrics                             Anesthesia Physical Anesthesia Plan  ASA: 3  Anesthesia Plan: MAC and Spinal   Post-op Pain Management: Regional block*   Induction: Intravenous  PONV Risk Score and Plan: 1 and Propofol infusion, Treatment may vary due to age or medical condition and Ondansetron  Airway Management Planned: Simple Face Mask  Additional Equipment:   Intra-op Plan:   Post-operative Plan:   Informed Consent: I have reviewed the patients History and Physical, chart, labs and discussed the procedure including the risks, benefits and alternatives for the proposed anesthesia with the patient or authorized representative who has indicated his/her understanding and acceptance.     Dental advisory given  Plan Discussed with: CRNA, Anesthesiologist and Surgeon  Anesthesia Plan Comments:        Anesthesia Quick Evaluation

## 2022-08-23 NOTE — Transfer of Care (Signed)
Immediate Anesthesia Transfer of Care Note  Patient: Joshua Ruiz  Procedure(s) Performed: LEFT TOTAL KNEE REPLACEMENT (Left: Knee)  Patient Location: PACU  Anesthesia Type:General  Level of Consciousness: awake  Airway & Oxygen Therapy: Patient Spontanous Breathing  Post-op Assessment: Report given to RN and Post -op Vital signs reviewed and stable  Post vital signs: Reviewed and stable  Last Vitals:  Vitals Value Taken Time  BP 142/77 08/23/22 1140  Temp    Pulse 64 08/23/22 1144  Resp 12 08/23/22 1144  SpO2 93 % 08/23/22 1144  Vitals shown include unfiled device data.  Last Pain:  Vitals:   08/23/22 0734  TempSrc:   PainSc: 0-No pain         Complications: No notable events documented.

## 2022-08-23 NOTE — Op Note (Signed)
Total Knee Arthroplasty Procedure Note  Preoperative diagnosis: Left knee osteoarthritis  Postoperative diagnosis:same  Operative findings: Complete loss of joint space from all compartments Mild flexion contracture Bone quality femur and tibia Sclerotic bone patella  Operative procedure: Left total knee arthroplasty. CPT [phone]  Surgeon: N. Glee Arvin, MD  Assist: Oneal Grout, PA-C; necessary for the timely completion of procedure and due to complexity of procedure.  Anesthesia: general, regional  Tourniquet time: see anesthesia record  Implants used: Zimmer persona Femur: CR 9 press-fit Tibia: G press-fit Patella: 35 mm, 3 peg cemented Polyethylene: 13 mm medial congruent  Indication: Joshua Ruiz is a 82 y.o. year old male with a history of knee pain. Having failed conservative management, the patient elected to proceed with a total knee arthroplasty.  We have reviewed the risk and benefits of the surgery and they elected to proceed after voicing understanding.  Procedure:  After informed consent was obtained and understanding of the risk were voiced including but not limited to bleeding, infection, damage to surrounding structures including nerves and vessels, blood clots, leg length inequality and the failure to achieve desired results, the operative extremity was marked with verbal confirmation of the patient in the holding area.   The patient was then brought to the operating room and transported to the operating room table in the supine position.  A tourniquet was applied to the operative extremity around the upper thigh. The operative limb was then prepped and draped in the usual sterile fashion and preoperative antibiotics were administered.  A time out was performed prior to the start of surgery confirming the correct extremity, preoperative antibiotic administration, as well as team members, implants and instruments available for the case. Correct  surgical site was also confirmed with preoperative radiographs. The limb was then elevated for exsanguination and the tourniquet was inflated. A midline incision was made and a standard medial parapatellar approach was performed.  The patella was everted which showed complete loss of articular cartilage.  The patella was prepared by resecting 9 mm and sized to a 35 mm.  A cover was placed on the patella for protection from retractors.  We then turned our attention to the femur. Posterior cruciate ligament was sacrificed. Start site was drilled in the femur and the intramedullary distal femoral cutting guide was placed, set at 5 degrees valgus, taking 11 mm of distal resection. The distal cut was made. Osteophytes were then removed.   Next, the proximal tibial cutting guide was placed with appropriate slope, varus/valgus alignment and depth of resection. A drop rod was attached to confirm that it was pointed to the second metatarsal.  The proximal tibial cut was made taking 4 mm off the lower medial side. Gap blocks were then used to assess the extension gap and alignment, and appropriate soft tissue releases were performed. Attention was turned back to the femur, which was sized using the sizing guide to a size 9 standard. Appropriate rotation of the femoral component was determined using epicondylar axis, Whiteside's line, and assessing the flexion gap under ligament tension. The appropriate size 4-in-1 cutting block was placed and cuts were made.  Posterior femoral osteophytes and uncapped bone were then removed with the curved osteotome.  Trial components were placed, and stability was checked in full extension, mid-flexion, and deep flexion. Proper tibial rotation was determined and marked.  The patella tracked well without a lateral release. Trial components were then removed and tibial preparation performed.  The tibial bone quality was  excellent.  The tibia was sized for a size G component.  The bony  surfaces were irrigated with a pulse lavage and then dried. Final components were placed.  The patellar component was cemented into place.  The stability of the construct was re-evaluated throughout a range of motion and found to be acceptable. The trial liner was removed, the knee was copiously irrigated, and the knee was re-evaluated for any excess bone debris. The real polyethylene liner, 13 mm thick, was inserted and checked to ensure the locking mechanism had engaged appropriately. The tourniquet was deflated and hemostasis was achieved. The wound was irrigated with normal saline.  One gram of vancomycin powder was placed in the surgical bed.  Topical 0.25% bupivacaine and meloxicam was placed in the joint for postoperative pain.  Capsular closure was performed with a #1 vicryl, subcutaneous fat closed with a 0 vicryl suture, then subcutaneous tissue closed with interrupted 2.0 vicryl suture. The skin was then closed with a 2.0 nylon and dermabond. A sterile dressing was applied.  The patient was awakened in the operating room and taken to recovery in stable condition. All sponge, needle, and instrument counts were correct at the end of the case.  Tessa Lerner was necessary for opening, closing, retracting, limb positioning and overall facilitation and completion of the surgery.  Position: supine  Complications: none.  Time Out: performed   Drains/Packing: none  Estimated blood loss: minimal  Returned to Recovery Room: in good condition.   Antibiotics: yes   Mechanical VTE (DVT) Prophylaxis: sequential compression devices, TED thigh-high  Chemical VTE (DVT) Prophylaxis: aspirin  Fluid Replacement  Crystalloid: see anesthesia record Blood: none  FFP: none   Specimens Removed: 1 to pathology   Sponge and Instrument Count Correct? yes   PACU: portable radiograph - knee AP and Lateral   Plan/RTC: Return in 2 weeks for wound check.   Weight Bearing/Load Lower Extremity: full    Implant Name Type Inv. Item Serial No. Manufacturer Lot No. LRB No. Used Action  CEMENT BONE REFOBACIN [id number] [place] - [id number] Cement CEMENT BONE REFOBACIN R1X40 [place]  ZIMMER RECON(ORTH,TRAU,BIO,SG) [id number] Left 2 Implanted  COMP TIB PS G 0D LT - [id number] Joint COMP TIB PS G 0D LT  ZIMMER RECON(ORTH,TRAU,BIO,SG) [id number] Left 1 Implanted  COMP FEM KNEE STD PS 9 LT - [id number] Joint COMP FEM KNEE STD PS 9 LT  ZIMMER RECON(ORTH,TRAU,BIO,SG) [id number] Left 1 Implanted  STEM POLY PAT PLY 44M KNEE - [id number] Knees STEM POLY PAT PLY 44M KNEE  ZIMMER RECON(ORTH,TRAU,BIO,SG) [id number] Left 1 Implanted  INSERT TIBIA ARTIC SZ 8-11 13 - [id number] Joint INSERT TIBIA ARTIC SZ 8-11 13  ZIMMER RECON(ORTH,TRAU,BIO,SG) [id number] Left 1 Implanted    N. Glee Arvin, MD [facility] 11:10 AM

## 2022-08-23 NOTE — Evaluation (Signed)
Physical Therapy Evaluation Patient Details Name: Joshua Ruiz MRN: [id number] DOB: 06-08-1940 Today's Date: 08/23/2022  History of Present Illness  82 y.o. male presents to [facility] hospital on 08/23/2022 for elective L TKA. PMH includes RA, GERD, back surgery.  Clinical Impression  Pt presents to PT with deficits in strength, power, gait, balance, endurance. Pt is able to ambulate for short household distances with support of RW. Pt benefits from verbal cues to refine technique for bed mobility and transfers. PT provides education on TKR exercise packet and encourages frequent mobilization in an effort to restore independence.         If plan is discharge home, recommend the following: A little help with walking and/or transfers;A little help with bathing/dressing/bathroom;Assistance with cooking/housework;Assist for transportation;Help with stairs or ramp for entrance   Can travel by private vehicle        Equipment Recommendations None recommended by PT  Recommendations for Other Services       Functional Status Assessment Patient has had a recent decline in their functional status and demonstrates the ability to make significant improvements in function in a reasonable and predictable amount of time.     Precautions / Restrictions Precautions Precautions: Fall Restrictions Weight Bearing Restrictions: Yes LLE Weight Bearing: Weight bearing as tolerated      Mobility  Bed Mobility Overal bed mobility: Needs Assistance Bed Mobility: Rolling, Sidelying to Sit, Sit to Supine Rolling: Contact guard assist Sidelying to sit: Contact guard assist, Used rails, HOB elevated   Sit to supine: Contact guard assist   General bed mobility comments: increased time    Transfers Overall transfer level: Needs assistance Equipment used: Rolling walker (2 wheels) Transfers: Sit to/from Stand Sit to Stand: Contact guard assist           General transfer comment: verbal cues for  hand placement and trunk flexion    Ambulation/Gait Ambulation/Gait assistance: Contact guard assist Gait Distance (Feet): 80 Feet Assistive device: Rolling walker (2 wheels) Gait Pattern/deviations: Step-through pattern Gait velocity: reduced Gait velocity interpretation: <1.8 ft/sec, indicate of risk for recurrent falls   General Gait Details: slowed step-through gait  Stairs            Wheelchair Mobility     Tilt Bed    Modified Rankin (Stroke Patients Only)       Balance Overall balance assessment: Needs assistance Sitting-balance support: No upper extremity supported, Feet supported Sitting balance-Leahy Scale: Good     Standing balance support: Single extremity supported, Reliant on assistive device for balance Standing balance-Leahy Scale: Poor                               Pertinent Vitals/Pain Pain Assessment Pain Assessment: 0-10 Pain Score: 3  Pain Location: L knee Pain Descriptors / Indicators: Sore Pain Intervention(s): Monitored during session    Home Living Family/patient expects to be discharged to:: Private residence Living Arrangements: Alone Available Help at Discharge: Family;Available PRN/intermittently Type of Home: House Home Access: Level entry       Home Layout: One level Home Equipment: Agricultural consultant (2 wheels);Shower seat;BSC/3in1;Adaptive equipment      Prior Function Prior Level of Function : Independent/Modified Independent;Driving             Mobility Comments: ambulatign with RW in the mornings due to stiffness in knee       Extremity/Trunk Assessment   Upper Extremity Assessment Upper Extremity Assessment: Overall WFL for  tasks assessed    Lower Extremity Assessment Lower Extremity Assessment: LLE deficits/detail LLE Deficits / Details: generalized post-op weakness and knee flexion ROM deficits as expected POD 0 s/p TKA    Cervical / Trunk Assessment Cervical / Trunk Assessment: Normal   Communication   Communication Communication: No apparent difficulties  Cognition Arousal: Alert Behavior During Therapy: WFL for tasks assessed/performed Overall Cognitive Status: Within Functional Limits for tasks assessed                                          General Comments General comments (skin integrity, edema, etc.): VSS on RA    Exercises Other Exercises Other Exercises: PT provides education TKR exercise packet   Assessment/Plan    PT Assessment Patient needs continued PT services  PT Problem List Decreased strength;Decreased activity tolerance;Decreased balance;Decreased mobility;Decreased knowledge of use of DME;Pain       PT Treatment Interventions DME instruction;Gait training;Functional mobility training;Therapeutic activities;Therapeutic exercise;Balance training;Neuromuscular re-education;Patient/family education    PT Goals (Current goals can be found in the Care Plan section)  Acute Rehab PT Goals Patient Stated Goal: to go home PT Goal Formulation: With patient Time For Goal Achievement: 08/27/22 Potential to Achieve Goals: Good    Frequency 7X/week     Co-evaluation               AM-PAC PT "6 Clicks" Mobility  Outcome Measure Help needed turning from your back to your side while in a flat bed without using bedrails?: A Little Help needed moving from lying on your back to sitting on the side of a flat bed without using bedrails?: A Little Help needed moving to and from a bed to a chair (including a wheelchair)?: A Little Help needed standing up from a chair using your arms (e.g., wheelchair or bedside chair)?: A Little Help needed to walk in hospital room?: A Little Help needed climbing 3-5 steps with a railing? : A Lot 6 Click Score: 17    End of Session   Activity Tolerance: Patient tolerated treatment well Patient left: in bed;with call bell/phone within reach;with family/visitor present Nurse Communication:  Mobility status PT Visit Diagnosis: Other abnormalities of gait and mobility (R26.89);Muscle weakness (generalized) (M62.81)    Time: 3664-4034 PT Time Calculation (min) (ACUTE ONLY): 27 min   Charges:   PT Evaluation $PT Eval Low Complexity: 1 Low   PT General Charges $$ ACUTE PT VISIT: 1 Visit         Arlyss Gandy, PT, DPT Acute Rehabilitation Office [phone]   Arlyss Gandy 08/23/2022, 3:32 PM

## 2022-08-23 NOTE — H&P (Signed)
PREOPERATIVE H&P  Chief Complaint: left knee osteoarthritis  HPI: Joshua Ruiz is a 82 y.o. male who presents for surgical treatment of left knee osteoarthritis.  He denies any changes in medical history.  Past Surgical History:  Procedure Laterality Date   COLONOSCOPY  2021   EYE SURGERY Bilateral    cataract extraction   HEMORRHOID SURGERY     LAMINECTOMY WITH POSTERIOR LATERAL ARTHRODESIS LEVEL 3 N/A 11/20/2021   Procedure: LUMBAR TWO THROUGH LUMBAR THREE, LUMBAR THREE THROUGH LUMBAR FOUR, LUMBAR FOUR THROUGH LUMBAR FIVE LAMINECTOMY WITH FACETECTOMY, POSTERIOR SEGMENTAL INSTRUMENTATION, POSTEROLATERAL ARTHRODESIS;  Surgeon: Lisbeth Renshaw, MD;  Location: [facility] OR;  Service: Neurosurgery;  Laterality: N/A;  3C   LYMPH NODE BIOPSY     Neck   MOHS SURGERY Left 2016   [facility] in [place]   TRIGGER FINGER RELEASE Right    3rd   Social History   Socioeconomic History   Marital status: Married    Spouse name: Not on file   Number of children: 1   Years of education: Not on file   Highest education level: Not on file  Occupational History   Not on file  Tobacco Use   Smoking status: Never    Passive exposure: Never   Smokeless tobacco: Never  Vaping Use   Vaping status: Never Used  Substance and Sexual Activity   Alcohol use: Not Currently    Comment: none   Drug use: Never   Sexual activity: Yes  Other Topics Concern   Not on file  Social History Narrative   Retired- Haematologist   Social Determinants of Health   Financial Resource Strain: Low Risk  (10/21/2021)   Overall Financial Resource Strain (CARDIA)    Difficulty of Paying Living Expenses: Not hard at all  Food Insecurity: No Food Insecurity (10/21/2021)   Hunger Vital Sign    Worried About Running Out of Food in the Last Year: Never true    Ran Out of Food in the Last Year: Never true  Transportation Needs: No Transportation Needs (10/04/2020)   PRAPARE - Therapist, art (Medical): No    Lack of Transportation (Non-Medical): No  Physical Activity: Insufficiently Active (10/21/2021)   Exercise Vital Sign    Days of Exercise per Week: 3 days    Minutes of Exercise per Session: 30 min  Stress: No Stress Concern Present (10/21/2021)   [employer] of Occupational Health - Occupational Stress Questionnaire    Feeling of Stress : Not at all  Social Connections: Moderately Isolated (10/21/2021)   Social Connection and Isolation Panel [NHANES]    Frequency of Communication with Friends and Family: More than three times a week    Frequency of Social Gatherings with Friends and Family: More than three times a week    Attends Religious Services: More than 4 times per year    Active Member of [employer] or Organizations: No    Attends Banker Meetings: Never    Marital Status: Widowed   Family History  Problem Relation Age of Onset   Rheum arthritis Mother    Diabetes Mother    Cancer Sister        Lung   Cancer Brother        Lung   No Known Allergies Prior to Admission medications   Medication Sig Start Date End Date Taking? Authorizing Provider  acetaminophen (TYLENOL) 325 MG tablet Take 650 mg by mouth every 6 (six)  hours as needed for moderate pain.   Yes [provider]  aspirin EC 81 MG tablet Take 1 tablet (81 mg total) by mouth 2 (two) times daily. To be taken after surgery to prevent blood clots 08/16/22 08/16/23  Cristie Hem, PA-C  cetirizine (ZYRTEC) 10 MG tablet Take 10 mg by mouth at bedtime.   Yes [provider]  docusate sodium (COLACE) 100 MG capsule Take 1 capsule (100 mg total) by mouth daily as needed. 08/16/22 08/16/23 Yes Cristie Hem, PA-C  finasteride (PROSCAR) 5 MG tablet Take 5 mg by mouth daily.   Yes [provider]  fluticasone (FLONASE) 50 MCG/ACT nasal spray Place into both nostrils. 07/04/22  Yes [provider]  folic acid (FOLVITE) 1 MG tablet Take 1 mg  by mouth daily.   Yes [provider]  hydroxychloroquine (PLAQUENIL) 200 MG tablet Take 200 mg by mouth 2 (two) times daily.   Yes [provider]  loratadine (CLARITIN) 10 MG tablet Take 10 mg by mouth in the morning.   Yes [provider]  methocarbamol (ROBAXIN) 500 MG tablet Take 1 tablet (500 mg total) by mouth 2 (two) times daily as needed. 08/16/22   Cristie Hem, PA-C  methotrexate (RHEUMATREX) 2.5 MG tablet Take 15 mg by mouth every Saturday. Caution:Chemotherapy. Protect from light.   Yes [provider]  naproxen sodium (ALEVE) 220 MG tablet Take 220 mg by mouth daily as needed (pain).   Yes [provider]  omeprazole (PRILOSEC) 20 MG capsule Take 20 mg by mouth in the morning and at bedtime.   Yes [provider]  ondansetron (ZOFRAN) 4 MG tablet Take 1 tablet (4 mg total) by mouth every 8 (eight) hours as needed for nausea or vomiting. 08/16/22   Cristie Hem, PA-C  oxyCODONE-acetaminophen (PERCOCET) 5-325 MG tablet Take 1-2 tablets by mouth every 6 (six) hours as needed. To be taken after surgery 08/16/22   Cristie Hem, PA-C  tadalafil (CIALIS) 5 MG tablet Take 5 mg by mouth daily. 04/26/22  Yes [provider]  azelastine (ASTELIN) 0.1 % nasal spray Place 1 spray into both nostrils 2 (two) times daily. 01/18/22   Loyola Mast, MD  ipratropium (ATROVENT) 0.06 % nasal spray Place 1 spray into both nostrils 2 (two) times daily. 07/26/22   [provider]  methocarbamol (ROBAXIN) 500 MG tablet Take 1 tablet (500 mg total) by mouth every 6 (six) hours as needed for muscle spasms. 11/23/21   Lisbeth Renshaw, MD  mometasone (ELOCON) 0.1 % cream Apply topically daily. 01/18/22   Loyola Mast, MD  Multiple Vitamin (MULTIVITAMIN WITH MINERALS) TABS tablet Take 1 tablet by mouth daily.    [provider]  Multiple Vitamins-Minerals (PRESERVISION AREDS 2) CAPS Take 1 capsule by mouth in the morning and at  bedtime.    [provider]  sildenafil (VIAGRA) 100 MG tablet Take 100 mg by mouth daily as needed. 07/31/22   [provider]     Positive ROS: All other systems have been reviewed and were otherwise negative with the exception of those mentioned in the HPI and as above.  Physical Exam: General: Alert, no acute distress Cardiovascular: No pedal edema Respiratory: No cyanosis, no use of accessory musculature GI: abdomen soft Skin: No lesions in the area of chief complaint Neurologic: Sensation intact distally Psychiatric: Patient is competent for consent with normal mood and affect Lymphatic: no lymphedema  MUSCULOSKELETAL: exam stable  Assessment:  left knee osteoarthritis  Plan: Plan for Procedure(s): LEFT TOTAL KNEE ARTHROPLASTY  The risks benefits and alternatives were discussed with the patient including but not limited to the risks of nonoperative treatment, versus surgical intervention including infection, bleeding, nerve injury,  blood clots, cardiopulmonary complications, morbidity, mortality, among others, and they were willing to proceed.   Glee Arvin, MD 08/23/2022 8:31 AM

## 2022-08-23 NOTE — Anesthesia Procedure Notes (Signed)
Anesthesia Regional Block: Adductor canal block   Pre-Anesthetic Checklist: , timeout performed,  Correct Patient, Correct Site, Correct Laterality,  Correct Procedure, Correct Position, site marked,  Risks and benefits discussed,  Surgical consent,  Pre-op evaluation,  At surgeon's request and post-op pain management  Laterality: Left  Prep: chloraprep       Needles:  Injection technique: Single-shot  Needle Type: Echogenic Needle     Needle Length: 9cm  Needle Gauge: 21     Additional Needles:   Narrative:  Start time: 08/23/2022 8:35 AM End time: 08/23/2022 8:42 AM Injection made incrementally with aspirations every 5 mL.  Performed by: Personally  Anesthesiologist: Achille Rich, MD  Additional Notes: Pt tolerated the procedure well.

## 2022-08-24 ENCOUNTER — Encounter (HOSPITAL_COMMUNITY): Payer: Self-pay | Admitting: Orthopaedic Surgery

## 2022-08-24 DIAGNOSIS — Z85828 Personal history of other malignant neoplasm of skin: Secondary | ICD-10-CM | POA: Diagnosis not present

## 2022-08-24 DIAGNOSIS — M1712 Unilateral primary osteoarthritis, left knee: Secondary | ICD-10-CM | POA: Diagnosis not present

## 2022-08-24 DIAGNOSIS — Z96652 Presence of left artificial knee joint: Secondary | ICD-10-CM | POA: Diagnosis not present

## 2022-08-24 NOTE — Anesthesia Postprocedure Evaluation (Signed)
Anesthesia Post Note  Patient: Joshua Ruiz  Procedure(s) Performed: LEFT TOTAL KNEE REPLACEMENT (Left: Knee)     Patient location during evaluation: PACU Anesthesia Type: Regional and General Level of consciousness: awake and alert Pain management: pain level controlled Vital Signs Assessment: post-procedure vital signs reviewed and stable Respiratory status: spontaneous breathing, nonlabored ventilation, respiratory function stable and patient connected to nasal cannula oxygen Cardiovascular status: blood pressure returned to baseline and stable Postop Assessment: no apparent nausea or vomiting Anesthetic complications: no   No notable events documented.  Last Vitals:  Vitals:   08/24/22 0729 08/24/22 0823  BP: (!) 177/86 (!) 145/77  Pulse: 73 72  Resp: 16 16  Temp: 36.8 C   SpO2: 97% 96%    Last Pain:  Vitals:   08/24/22 0800  TempSrc:   PainSc: 2                  , S

## 2022-08-24 NOTE — Discharge Summary (Signed)
Patient ID: Joshua Ruiz MRN: [id number] DOB/AGE: Aug 25, 1940 82 y.o.  Admit date: 08/23/2022 Discharge date: 08/24/2022  Admission Diagnoses:  Principal Problem:   Primary osteoarthritis of left knee Active Problems:   Status post total left knee replacement   Discharge Diagnoses:  Same  Past Medical History:  Diagnosis Date   Cancer [facility])    Skin cancer left ear and nose   Complication of anesthesia    Sinus Infection after back surgery 11/2021   GERD (gastroesophageal reflux disease)    Rheumatoid arthritis (HCC)     Surgeries: Procedure(s): LEFT TOTAL KNEE REPLACEMENT on 08/23/2022   Consultants:   Discharged Condition: Improved  Hospital Course: Joshua Ruiz is an 82 y.o. male who was admitted 08/23/2022 for operative treatment ofPrimary osteoarthritis of left knee. Patient has severe unremitting pain that affects sleep, daily activities, and work/hobbies. After pre-op clearance the patient was taken to the operating room on 08/23/2022 and underwent  Procedure(s): LEFT TOTAL KNEE REPLACEMENT.    Patient was given perioperative antibiotics:  Anti-infectives (From admission, onward)    Start     Dose/Rate Route Frequency Ordered Stop   08/23/22 1600  ceFAZolin (ANCEF) IVPB 2g/100 mL premix        2 g 200 mL/hr over 30 Minutes Intravenous Every 6 hours 08/23/22 1315 08/24/22 0019   08/23/22 1041  vancomycin (VANCOCIN) powder  Status:  Discontinued          As needed 08/23/22 1041 08/23/22 1138   08/23/22 0715  ceFAZolin (ANCEF) IVPB 2g/100 mL premix        2 g 200 mL/hr over 30 Minutes Intravenous On call to O.R. 08/23/22 0709 08/23/22 1016        Patient was given sequential compression devices, early ambulation, and chemoprophylaxis to prevent DVT.  Patient benefited maximally from hospital stay and there were no complications.    Recent vital signs: Patient Vitals for the past 24 hrs:  BP Temp Temp src Pulse Resp SpO2  08/24/22 0729 (!) 177/86 98.2 F  (36.8 C) Oral 73 16 97 %  08/24/22 0306 (!) 150/75 98.2 F (36.8 C) Oral 70 18 97 %  08/23/22 2252 (!) 158/78 98.4 F (36.9 C) Oral 74 18 94 %  08/23/22 1942 (!) 149/78 98.4 F (36.9 C) Oral 77 18 96 %  08/23/22 1551 (!) 141/64 (!) 97.5 F (36.4 C) Oral 74 20 98 %  08/23/22 1309 (!) 160/78 97.8 F (36.6 C) Oral 63 20 100 %  08/23/22 1245 (!) 144/77 97.6 F (36.4 C) -- 61 13 98 %  08/23/22 1230 (!) 145/76 -- -- 60 11 99 %  08/23/22 1215 (!) 144/76 -- -- 61 12 98 %  08/23/22 1200 (!) 143/76 -- -- 61 14 97 %  08/23/22 1140 (!) 142/77 97.6 F (36.4 C) -- 60 10 94 %  08/23/22 0900 (!) 168/79 -- -- 64 17 98 %  08/23/22 0845 (!) 177/82 -- -- 66 14 98 %  08/23/22 0840 (!) 175/74 -- -- 67 16 98 %  08/23/22 0835 (!) 187/91 -- -- 67 17 99 %     Recent laboratory studies:  Recent Labs    08/24/22 0020  WBC 8.9  HGB 11.3*  HCT 33.0*  PLT 195     Discharge Medications:   Allergies as of 08/24/2022   No Known Allergies      Medication List     STOP taking these medications    acetaminophen 325 MG tablet Commonly known as:  TYLENOL   naproxen sodium 220 MG tablet Commonly known as: ALEVE       TAKE these medications    aspirin EC 81 MG tablet Take 1 tablet (81 mg total) by mouth 2 (two) times daily. To be taken after surgery to prevent blood clots   azelastine 0.1 % nasal spray Commonly known as: ASTELIN Place 1 spray into both nostrils 2 (two) times daily.   cetirizine 10 MG tablet Commonly known as: ZYRTEC Take 10 mg by mouth at bedtime.   docusate sodium 100 MG capsule Commonly known as: Colace Take 1 capsule (100 mg total) by mouth daily as needed.   finasteride 5 MG tablet Commonly known as: PROSCAR Take 5 mg by mouth daily.   fluticasone 50 MCG/ACT nasal spray Commonly known as: FLONASE Place into both nostrils.   folic acid 1 MG tablet Commonly known as: FOLVITE Take 1 mg by mouth daily.   hydroxychloroquine 200 MG tablet Commonly known as:  PLAQUENIL Take 200 mg by mouth 2 (two) times daily.   ipratropium 0.06 % nasal spray Commonly known as: ATROVENT Place 1 spray into both nostrils 2 (two) times daily.   loratadine 10 MG tablet Commonly known as: CLARITIN Take 10 mg by mouth in the morning.   methocarbamol 500 MG tablet Commonly known as: ROBAXIN Take 1 tablet (500 mg total) by mouth every 6 (six) hours as needed for muscle spasms. What changed: Another medication with the same name was removed. Continue taking this medication, and follow the directions you see here.   methotrexate 2.5 MG tablet Commonly known as: RHEUMATREX Take 15 mg by mouth every Saturday. Caution:Chemotherapy. Protect from light.   mometasone 0.1 % cream Commonly known as: ELOCON Apply topically daily.   multivitamin with minerals Tabs tablet Take 1 tablet by mouth daily.   omeprazole 20 MG capsule Commonly known as: PRILOSEC Take 20 mg by mouth in the morning and at bedtime.   ondansetron 4 MG tablet Commonly known as: Zofran Take 1 tablet (4 mg total) by mouth every 8 (eight) hours as needed for nausea or vomiting.   oxyCODONE-acetaminophen 5-325 MG tablet Commonly known as: Percocet Take 1-2 tablets by mouth every 6 (six) hours as needed. To be taken after surgery   PreserVision AREDS 2 Caps Take 1 capsule by mouth in the morning and at bedtime.   sildenafil 100 MG tablet Commonly known as: VIAGRA Take 100 mg by mouth daily as needed.   tadalafil 5 MG tablet Commonly known as: CIALIS Take 5 mg by mouth daily.               Durable Medical Equipment  (From admission, onward)           Start     Ordered   08/23/22 1315  DME Walker rolling  Once       Question Answer Comment  Walker: With 5 Inch Wheels   Patient needs a walker to treat with the following condition Status post left partial knee replacement      08/23/22 1315   08/23/22 1315  DME 3 n 1  Once        08/23/22 1315   08/23/22 1315  DME Bedside  commode  Once       Question:  Patient needs a bedside commode to treat with the following condition  Answer:  Status post left partial knee replacement   08/23/22 1315            Diagnostic  Studies: DG Knee Left Port  Result Date: 08/23/2022 CLINICAL DATA:  Knee replacement EXAM: PORTABLE LEFT KNEE - 1-2 VIEW COMPARISON:  08/11/2022 FINDINGS: Two-view show total knee arthroplasty. Components appear well positioned. No unexpected finding. Air and fluid in the operative region as expected. IMPRESSION: Good appearance following total knee arthroplasty. Electronically Signed   By: Paulina Fusi M.D.   On: 08/23/2022 16:39   XR Knee 1-2 Views Left  Result Date: 08/12/2022 X-rays demonstrate advanced DJD of the left knee   Disposition: Discharge disposition: 01-Home or Self Care          Follow-up Information     Cristie Hem, PA-C. Schedule an appointment as soon as possible for a visit in 2 week(s).   Specialty: Orthopedic Surgery Contact information: [address] [place] [address] [phone]         [facility] Follow up.   Why: [facility]. the home health information will contact you for the first home visit. Contact information: [address] [place] [address] [phone]                  Signed: Cristie Hem 08/24/2022, 8:02 AM

## 2022-08-24 NOTE — Progress Notes (Signed)
Physical Therapy Treatment Patient Details Name: Joshua Ruiz MRN: [id number] DOB: 1940-02-24 Today's Date: 08/24/2022   History of Present Illness 82 y.o. male presents to [facility] hospital on 08/23/2022 for elective L TKA. PMH includes RA, GERD, back surgery.    PT Comments  Pt tolerates treatment well, ambulating for increased distances and demonstrating improved transfer quality. PT encourages frequent mobilization. Pt denies concerns about TKR HEP. PT recommends discharge home when medically appropriate.    If plan is discharge home, recommend the following: A little help with bathing/dressing/bathroom;Assistance with cooking/housework;Assist for transportation;Help with stairs or ramp for entrance   Can travel by private vehicle        Equipment Recommendations  None recommended by PT    Recommendations for Other Services       Precautions / Restrictions Precautions Precautions: Fall Restrictions Weight Bearing Restrictions: Yes LLE Weight Bearing: Weight bearing as tolerated     Mobility  Bed Mobility Overal bed mobility: Modified Independent Bed Mobility: Supine to Sit, Sit to Supine     Supine to sit: Modified independent (Device/Increase time), HOB elevated, Used rails Sit to supine: Modified independent (Device/Increase time)        Transfers Overall transfer level: Modified independent Equipment used: Rolling walker (2 wheels) Transfers: Sit to/from Stand Sit to Stand: Modified independent (Device/Increase time)                Ambulation/Gait Ambulation/Gait assistance: Supervision Gait Distance (Feet): 200 Feet Assistive device: Rolling walker (2 wheels) Gait Pattern/deviations: Step-through pattern Gait velocity: reduced Gait velocity interpretation: <1.8 ft/sec, indicate of risk for recurrent falls   General Gait Details: slowed step-through gait   Stairs             Wheelchair Mobility     Tilt Bed    Modified Rankin (Stroke  Patients Only)       Balance Overall balance assessment: Needs assistance Sitting-balance support: No upper extremity supported, Feet supported Sitting balance-Leahy Scale: Good     Standing balance support: Single extremity supported, Reliant on assistive device for balance Standing balance-Leahy Scale: Poor                              Cognition Arousal: Alert Behavior During Therapy: WFL for tasks assessed/performed Overall Cognitive Status: Within Functional Limits for tasks assessed                                          Exercises Total Joint Exercises Goniometric ROM: -5 extension, 73 flexion    General Comments General comments (skin integrity, edema, etc.): VSS on RA      Pertinent Vitals/Pain Pain Assessment Pain Assessment: No/denies pain    Home Living                          Prior Function            PT Goals (current goals can now be found in the care plan section) Acute Rehab PT Goals Patient Stated Goal: to go home Progress towards PT goals: Progressing toward goals    Frequency    7X/week      PT Plan      Co-evaluation              AM-PAC PT "6 Clicks" Mobility   Outcome  Measure  Help needed turning from your back to your side while in a flat bed without using bedrails?: None Help needed moving from lying on your back to sitting on the side of a flat bed without using bedrails?: None Help needed moving to and from a bed to a chair (including a wheelchair)?: None Help needed standing up from a chair using your arms (e.g., wheelchair or bedside chair)?: None Help needed to walk in hospital room?: A Little Help needed climbing 3-5 steps with a railing? : A Little 6 Click Score: 22    End of Session   Activity Tolerance: Patient tolerated treatment well Patient left: in bed;with call bell/phone within reach Nurse Communication: Mobility status PT Visit Diagnosis: Other abnormalities  of gait and mobility (R26.89);Muscle weakness (generalized) (M62.81)     Time: 4540-9811 PT Time Calculation (min) (ACUTE ONLY): 17 min  Charges:    $Gait Training: 8-22 mins PT General Charges $$ ACUTE PT VISIT: 1 Visit                     Arlyss Gandy, PT, DPT Acute Rehabilitation Office [phone]    Arlyss Gandy 08/24/2022, 9:14 AM

## 2022-08-24 NOTE — Plan of Care (Signed)
Pt and daughter given D/C instructions with verbal understanding. Rx's were sent to the pharmacy by MD. Pt's incision is clean and dry with no sign of infection. Pt's IV was removed prior to D/C. Home Health was arranged by MD office prior to surgery. Pt D/C'd home via wheelchair per MD order. Pt is stable @ D/C and has no other needs at this time. Rema Fendt, RN

## 2022-08-24 NOTE — Progress Notes (Signed)
Subjective: 1 Day Post-Op Procedure(s) (LRB): LEFT TOTAL KNEE REPLACEMENT (Left) Patient reports pain as mild.    Objective: Vital signs in last 24 hours: Temp:  [97.5 F (36.4 C)-98.4 F (36.9 C)] 98.2 F (36.8 C) (08/13 0729) Pulse Rate:  [60-77] 73 (08/13 0729) Resp:  [10-20] 16 (08/13 0729) BP: (141-187)/(64-91) 177/86 (08/13 0729) SpO2:  [94 %-100 %] 97 % (08/13 0729)  Intake/Output from previous day: 08/12 0701 - 08/13 0700 In: 1880 [P.O.:480; I.V.:1100; IV Piggyback:300] Out: 450 [Urine:400; Blood:50] Intake/Output this shift: No intake/output data recorded.  Recent Labs    08/24/22 0020  HGB 11.3*   Recent Labs    08/24/22 0020  WBC 8.9  RBC 3.68*  HCT 33.0*  PLT 195   No results for input(s): "NA", "K", "CL", "CO2", "BUN", "CREATININE", "GLUCOSE", "CALCIUM" in the last 72 hours. No results for input(s): "LABPT", "INR" in the last 72 hours.  Neurologically intact Neurovascular intact Sensation intact distally Intact pulses distally Dorsiflexion/Plantar flexion intact Incision: dressing C/D/I No cellulitis present Compartment soft   Assessment/Plan: 1 Day Post-Op Procedure(s) (LRB): LEFT TOTAL KNEE REPLACEMENT (Left) Advance diet Up with therapy D/C IV fluids Discharge home with home health once cleared by PT WBAT LLE ABLA- mild and stable BPH- has had some dribbling since surgery.  Continue cialis     Cristie Hem 08/24/2022, 8:00 AM

## 2022-08-24 NOTE — Evaluation (Signed)
Occupational Therapy Evaluation Patient Details Name: Joshua Ruiz MRN: [id number] DOB: 01-Oct-1940 Today's Date: 08/24/2022   History of Present Illness 82 y.o. male presents to [facility] hospital on 08/23/2022 for elective L TKA. PMH includes RA, GERD, back surgery.   Clinical Impression   Patient evaluated by Occupational Therapy with no further acute OT needs identified. All education has been completed and the patient has no further questions. See below for any follow-up Occupational Therapy or equipment needs. OT to sign off. Thank you for referral.         If plan is discharge home, recommend the following:      Functional Status Assessment  Patient has had a recent decline in their functional status and demonstrates the ability to make significant improvements in function in a reasonable and predictable amount of time.  Equipment Recommendations  None recommended by OT    Recommendations for Other Services       Precautions / Restrictions Precautions Precautions: Fall Restrictions Weight Bearing Restrictions: Yes LLE Weight Bearing: Weight bearing as tolerated      Mobility Bed Mobility Overal bed mobility: Modified Independent                  Transfers Overall transfer level: Modified independent                        Balance Overall balance assessment: Needs assistance         Standing balance support: No upper extremity supported, During functional activity Standing balance-Leahy Scale: Fair                             ADL either performed or assessed with clinical judgement   ADL Overall ADL's : Modified independent                                       General ADL Comments: able to dress LB and used reacher. pt has reacher at home. pt educated on bathroom transfer with void of bladder. pt educated on surface heights for adl items     Vision Baseline Vision/History: 1 Wears glasses Ability to See in  Adequate Light: 0 Adequate Patient Visual Report: No change from baseline Vision Assessment?: No apparent visual deficits     Perception Perception: Within Functional Limits       Praxis         Pertinent Vitals/Pain Pain Assessment Pain Assessment: No/denies pain     Extremity/Trunk Assessment Upper Extremity Assessment Upper Extremity Assessment: Overall WFL for tasks assessed   Lower Extremity Assessment Lower Extremity Assessment: Defer to PT evaluation;LLE deficits/detail LLE Deficits / Details: pt demonstrates knee flexion with sit<>stand   Cervical / Trunk Assessment Cervical / Trunk Assessment: Normal   Communication Communication Communication: No apparent difficulties   Cognition Arousal: Alert Behavior During Therapy: WFL for tasks assessed/performed Overall Cognitive Status: Within Functional Limits for tasks assessed                                       General Comments  VSS on RA    Exercises Other Exercises Other Exercises: discussed ice machine with daughter in law present regarding setup   Shoulder Instructions      Home Living Family/patient expects  to be discharged to:: Private residence Living Arrangements: Alone Available Help at Discharge: Family;Available PRN/intermittently Type of Home: House Home Access: Level entry     Home Layout: One level     Bathroom Shower/Tub: Producer, television/film/video: Handicapped height Bathroom Accessibility: Yes How Accessible: Accessible via walker Home Equipment: Rolling Walker (2 wheels);Shower seat;BSC/3in1;Adaptive equipment Adaptive Equipment: Reacher;Sock aid Additional Comments: will have (A) from son and daughter in law      Prior Functioning/Environment Prior Level of Function : Independent/Modified Independent;Driving                        OT Problem List:        OT Treatment/Interventions:      OT Goals(Current goals can be found in the care  plan section) Acute Rehab OT Goals Patient Stated Goal: to go home  OT Frequency:      Co-evaluation              AM-PAC OT "6 Clicks" Daily Activity     Outcome Measure Help from another person eating meals?: None Help from another person taking care of personal grooming?: None Help from another person toileting, which includes using toliet, bedpan, or urinal?: None Help from another person bathing (including washing, rinsing, drying)?: None Help from another person to put on and taking off regular upper body clothing?: None Help from another person to put on and taking off regular lower body clothing?: None 6 Click Score: 24   End of Session Equipment Utilized During Treatment: Gait belt;Rolling walker (2 wheels) Nurse Communication: Mobility status;Precautions  Activity Tolerance: Patient tolerated treatment well Patient left: in chair;with call bell/phone within reach;with family/visitor present  OT Visit Diagnosis: Unsteadiness on feet (R26.81)                Time: 5732-2025 OT Time Calculation (min): 16 min Charges:  OT General Charges $OT Visit: 1 Visit OT Evaluation $OT Eval Low Complexity: 1 Low   Brynn, OTR/L  Acute Rehabilitation Services Office: [phone] .   Mateo Flow 08/24/2022, 10:37 AM

## 2022-08-25 ENCOUNTER — Telehealth: Payer: Self-pay | Admitting: *Deleted

## 2022-08-25 DIAGNOSIS — Z7982 Long term (current) use of aspirin: Secondary | ICD-10-CM | POA: Diagnosis not present

## 2022-08-25 DIAGNOSIS — M48062 Spinal stenosis, lumbar region with neurogenic claudication: Secondary | ICD-10-CM | POA: Diagnosis not present

## 2022-08-25 DIAGNOSIS — C44301 Unspecified malignant neoplasm of skin of nose: Secondary | ICD-10-CM | POA: Diagnosis not present

## 2022-08-25 DIAGNOSIS — Z471 Aftercare following joint replacement surgery: Secondary | ICD-10-CM | POA: Diagnosis not present

## 2022-08-25 DIAGNOSIS — M069 Rheumatoid arthritis, unspecified: Secondary | ICD-10-CM | POA: Diagnosis not present

## 2022-08-25 DIAGNOSIS — C44209 Unspecified malignant neoplasm of skin of left ear and external auricular canal: Secondary | ICD-10-CM | POA: Diagnosis not present

## 2022-08-25 DIAGNOSIS — Z791 Long term (current) use of non-steroidal anti-inflammatories (NSAID): Secondary | ICD-10-CM | POA: Diagnosis not present

## 2022-08-25 DIAGNOSIS — Z96652 Presence of left artificial knee joint: Secondary | ICD-10-CM | POA: Diagnosis not present

## 2022-08-25 DIAGNOSIS — K219 Gastro-esophageal reflux disease without esophagitis: Secondary | ICD-10-CM | POA: Diagnosis not present

## 2022-08-25 NOTE — Telephone Encounter (Signed)
Ortho bundle D/C call completed. 

## 2022-08-26 ENCOUNTER — Encounter (HOSPITAL_COMMUNITY): Payer: Self-pay | Admitting: Orthopaedic Surgery

## 2022-08-27 DIAGNOSIS — Z7982 Long term (current) use of aspirin: Secondary | ICD-10-CM | POA: Diagnosis not present

## 2022-08-27 DIAGNOSIS — C44209 Unspecified malignant neoplasm of skin of left ear and external auricular canal: Secondary | ICD-10-CM | POA: Diagnosis not present

## 2022-08-27 DIAGNOSIS — M069 Rheumatoid arthritis, unspecified: Secondary | ICD-10-CM | POA: Diagnosis not present

## 2022-08-27 DIAGNOSIS — Z96652 Presence of left artificial knee joint: Secondary | ICD-10-CM | POA: Diagnosis not present

## 2022-08-27 DIAGNOSIS — C44301 Unspecified malignant neoplasm of skin of nose: Secondary | ICD-10-CM | POA: Diagnosis not present

## 2022-08-27 DIAGNOSIS — Z791 Long term (current) use of non-steroidal anti-inflammatories (NSAID): Secondary | ICD-10-CM | POA: Diagnosis not present

## 2022-08-27 DIAGNOSIS — M48062 Spinal stenosis, lumbar region with neurogenic claudication: Secondary | ICD-10-CM | POA: Diagnosis not present

## 2022-08-27 DIAGNOSIS — Z471 Aftercare following joint replacement surgery: Secondary | ICD-10-CM | POA: Diagnosis not present

## 2022-08-27 DIAGNOSIS — K219 Gastro-esophageal reflux disease without esophagitis: Secondary | ICD-10-CM | POA: Diagnosis not present

## 2022-08-30 ENCOUNTER — Telehealth: Payer: Self-pay | Admitting: Family Medicine

## 2022-08-30 ENCOUNTER — Other Ambulatory Visit: Payer: Self-pay | Admitting: Surgical

## 2022-08-30 ENCOUNTER — Emergency Department (HOSPITAL_COMMUNITY)
Admission: EM | Admit: 2022-08-30 | Discharge: 2022-08-31 | Disposition: A | Discharge status: Home / Self Care | Payer: Medicare PPO | Attending: Emergency Medicine | Admitting: Emergency Medicine

## 2022-08-30 ENCOUNTER — Telehealth: Payer: Self-pay | Admitting: *Deleted

## 2022-08-30 ENCOUNTER — Other Ambulatory Visit: Payer: Self-pay

## 2022-08-30 ENCOUNTER — Encounter (HOSPITAL_COMMUNITY): Payer: Self-pay

## 2022-08-30 DIAGNOSIS — Z85828 Personal history of other malignant neoplasm of skin: Secondary | ICD-10-CM | POA: Insufficient documentation

## 2022-08-30 DIAGNOSIS — R339 Retention of urine, unspecified: Secondary | ICD-10-CM

## 2022-08-30 DIAGNOSIS — Z96652 Presence of left artificial knee joint: Secondary | ICD-10-CM | POA: Diagnosis not present

## 2022-08-30 DIAGNOSIS — I10 Essential (primary) hypertension: Secondary | ICD-10-CM | POA: Diagnosis not present

## 2022-08-30 DIAGNOSIS — Z7982 Long term (current) use of aspirin: Secondary | ICD-10-CM | POA: Insufficient documentation

## 2022-08-30 DIAGNOSIS — Z471 Aftercare following joint replacement surgery: Secondary | ICD-10-CM | POA: Diagnosis not present

## 2022-08-30 DIAGNOSIS — M48062 Spinal stenosis, lumbar region with neurogenic claudication: Secondary | ICD-10-CM | POA: Diagnosis not present

## 2022-08-30 DIAGNOSIS — M069 Rheumatoid arthritis, unspecified: Secondary | ICD-10-CM | POA: Diagnosis not present

## 2022-08-30 DIAGNOSIS — C44301 Unspecified malignant neoplasm of skin of nose: Secondary | ICD-10-CM | POA: Diagnosis not present

## 2022-08-30 DIAGNOSIS — K219 Gastro-esophageal reflux disease without esophagitis: Secondary | ICD-10-CM | POA: Diagnosis not present

## 2022-08-30 DIAGNOSIS — C44209 Unspecified malignant neoplasm of skin of left ear and external auricular canal: Secondary | ICD-10-CM | POA: Diagnosis not present

## 2022-08-30 DIAGNOSIS — Z791 Long term (current) use of non-steroidal anti-inflammatories (NSAID): Secondary | ICD-10-CM | POA: Diagnosis not present

## 2022-08-30 LAB — CBC WITH DIFFERENTIAL/PLATELET
Abs Immature Granulocytes: 0.03 10*3/uL (ref 0.00–0.07)
Basophils Absolute: 0 10*3/uL (ref 0.0–0.1)
Basophils Relative: 0 %
Eosinophils Absolute: 0 10*3/uL (ref 0.0–0.5)
Eosinophils Relative: 0 %
HCT: 30.8 % — ABNORMAL LOW (ref 39.0–52.0)
Hemoglobin: 10.4 g/dL — ABNORMAL LOW (ref 13.0–17.0)
Immature Granulocytes: 0 %
Lymphocytes Relative: 11 %
Lymphs Abs: 0.8 10*3/uL (ref 0.7–4.0)
MCH: 31.7 pg (ref 26.0–34.0)
MCHC: 33.8 g/dL (ref 30.0–36.0)
MCV: 93.9 fL (ref 80.0–100.0)
Monocytes Absolute: 0.6 10*3/uL (ref 0.1–1.0)
Monocytes Relative: 8 %
Neutro Abs: 5.8 10*3/uL (ref 1.7–7.7)
Neutrophils Relative %: 81 %
Platelets: 239 10*3/uL (ref 150–400)
RBC: 3.28 MIL/uL — ABNORMAL LOW (ref 4.22–5.81)
RDW: 14.1 % (ref 11.5–15.5)
WBC: 7.2 10*3/uL (ref 4.0–10.5)
nRBC: 0 % (ref 0.0–0.2)

## 2022-08-30 LAB — COMPREHENSIVE METABOLIC PANEL
ALT: 19 U/L (ref 0–44)
AST: 28 U/L (ref 15–41)
Albumin: 3.4 g/dL — ABNORMAL LOW (ref 3.5–5.0)
Alkaline Phosphatase: 74 U/L (ref 38–126)
Anion gap: 10 (ref 5–15)
BUN: 20 mg/dL (ref 8–23)
CO2: 22 mmol/L (ref 22–32)
Calcium: 8.3 mg/dL — ABNORMAL LOW (ref 8.9–10.3)
Chloride: 107 mmol/L (ref 98–111)
Creatinine, Ser: 0.89 mg/dL (ref 0.61–1.24)
GFR, Estimated: >60 mL/min (ref >60)
Glucose, Bld: 169 mg/dL — ABNORMAL HIGH (ref 70–99)
Potassium: 4.2 mmol/L (ref 3.5–5.1)
Sodium: 139 mmol/L (ref 135–145)
Total Bilirubin: 1 mg/dL (ref 0.3–1.2)
Total Protein: 6.7 g/dL (ref 6.5–8.1)

## 2022-08-30 LAB — URINALYSIS, W/ REFLEX TO CULTURE (INFECTION SUSPECTED)
Bacteria, UA: NONE SEEN
Bilirubin Urine: NEGATIVE
Glucose, UA: NEGATIVE mg/dL
Hgb urine dipstick: NEGATIVE
Ketones, ur: NEGATIVE mg/dL
Leukocytes,Ua: NEGATIVE
Nitrite: NEGATIVE
Protein, ur: NEGATIVE mg/dL
Specific Gravity, Urine: 1.02 (ref 1.005–1.030)
pH: 5 (ref 5.0–8.0)

## 2022-08-30 MED ORDER — SODIUM CHLORIDE 0.9 % IV BOLUS
500.0000 mL | Freq: Once | INTRAVENOUS | Status: AC
  Administered 2022-08-30: 500 mL via INTRAVENOUS

## 2022-08-30 MED ORDER — CIPROFLOXACIN HCL 500 MG PO TABS: 500.0000 mg | ORAL_TABLET | Freq: Two times a day (BID) | ORAL | 0 refills | Status: AC

## 2022-08-30 NOTE — Telephone Encounter (Signed)
Cordelia Pen called again and more she needed to discuss please call her back.

## 2022-08-30 NOTE — Telephone Encounter (Signed)
Spoke to [employer] , she states patient is having urine frequency and had surgery last week.  They sent in an RX for Cipro to the pharmacy.   Called him to make an appointment for 08/31/22 @ 2:20 pm. Dm/cma

## 2022-08-30 NOTE — ED Triage Notes (Signed)
Pt arrived via [facility] from home pt recently had left total knee replacement on 08/12. Pt has not urinated since 08/13 abdomin descended. Pt called pcp, pcp ordered cipro for urinary retention and bilaterial flank pain. Pt does have left leg swollen.

## 2022-08-30 NOTE — Telephone Encounter (Signed)
Joshua Ruiz, I sent in the prescription for Cipro per Dr. Warren Danes request.

## 2022-08-30 NOTE — ED Notes (Signed)
Bladder scanned pt, pt was retaining 950 ml of urine in bladder.

## 2022-08-30 NOTE — Telephone Encounter (Signed)
Joshua Ruiz from ortho care called and stated that when the pt urines it not a full one he urines a tiny bit. And there was some other things she would like to discuss please call her at [phone]

## 2022-08-30 NOTE — ED Provider Notes (Incomplete)
[place] EMERGENCY DEPARTMENT AT [facility] Provider Note  CSN: [id number] Arrival date & time: 08/30/22 2107  Chief Complaint(s) Urinary Retention  HPI Joshua Ruiz is a 82 y.o. 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 Authorizing Provider  aspirin EC 81 MG tablet Take 1 tablet (81 mg total) by mouth 2 (two) times daily. To be taken after surgery to prevent blood clots 08/16/22 08/16/23  Cristie Hem, PA-C  ciprofloxacin (CIPRO) 500 MG tablet Take 1 tablet (500 mg total) by mouth 2 (two) times daily for 7 days. 08/30/22 09/06/22  Magnant, Charles L, PA-C  ondansetron (ZOFRAN) 4 MG tablet Take 1 tablet (4 mg total) by mouth every 8 (eight) hours as needed for nausea or vomiting. 08/16/22   Cristie Hem, PA-C  oxyCODONE-acetaminophen (PERCOCET) 5-325 MG tablet Take 1-2 tablets by mouth every 6 (six) hours as needed. To be taken after surgery  08/16/22   Cristie Hem, PA-C  azelastine (ASTELIN) 0.1 % nasal spray Place 1 spray into both nostrils 2 (two) times daily. 01/18/22   Loyola Mast, MD  cetirizine (ZYRTEC) 10 MG tablet Take 10 mg by mouth at bedtime.    [provider]  docusate sodium (COLACE) 100 MG capsule Take 1 capsule (100 mg total) by mouth daily as needed. 08/16/22 08/16/23  Cristie Hem, PA-C  finasteride (PROSCAR) 5 MG tablet Take 5 mg by mouth daily.    [provider]  fluticasone (FLONASE) 50 MCG/ACT nasal spray Place into both nostrils. 07/04/22   [provider]  folic acid (FOLVITE) 1 MG tablet Take 1 mg by mouth daily.    [provider]  hydroxychloroquine (PLAQUENIL) 200 MG tablet Take 200 mg by mouth 2 (two) times daily.    [provider]  ipratropium (ATROVENT) 0.06 % nasal spray Place 1 spray into both nostrils 2 (two) times daily. 07/26/22   [provider]  loratadine (CLARITIN) 10 MG tablet Take 10 mg by mouth in the morning.    [provider]  methocarbamol (ROBAXIN) 500 MG tablet Take 1 tablet (500 mg total) by mouth every 6 (six) hours as needed for muscle spasms. 11/23/21   Lisbeth Renshaw, MD  methotrexate (RHEUMATREX) 2.5 MG tablet Take 15 mg by mouth every Saturday. Caution:Chemotherapy. Protect from light.    [provider]  mometasone (ELOCON) 0.1 % cream Apply topically daily. 01/18/22   Loyola Mast, MD  Multiple Vitamin (MULTIVITAMIN  WITH MINERALS) TABS tablet Take 1 tablet by mouth daily.    [provider]  Multiple Vitamins-Minerals (PRESERVISION AREDS 2) CAPS Take 1 capsule by mouth in the morning and at bedtime.    [provider]  omeprazole (PRILOSEC) 20 MG capsule Take 20 mg by mouth in the morning and at bedtime.    [provider]  sildenafil (VIAGRA) 100 MG tablet Take 100 mg by mouth daily as needed. 07/31/22   [provider]  tadalafil (CIALIS) 5 MG tablet Take 5 mg by  mouth daily. 04/26/22   [provider]                                                                                                                                    Allergies Patient has no known allergies.  Review of Systems Review of Systems As noted in HPI  Physical Exam Vital Signs  I have reviewed the triage vital signs BP (!) 177/82   Pulse 73   Temp 98.2 F (36.8 C)   Resp 16   Ht 5\' 8"  (1.727 m)   Wt 88.5 kg   SpO2 97%   BMI 29.65 kg/m  *** Physical Exam  ED Results and Treatments Labs (all labs ordered are listed, but only abnormal results are displayed) Labs Reviewed  URINALYSIS, W/ REFLEX TO CULTURE (INFECTION SUSPECTED)  CBC WITH DIFFERENTIAL/PLATELET  COMPREHENSIVE METABOLIC PANEL                                                                                                                         EKG  EKG Interpretation Date/Time:    Ventricular Rate:    PR Interval:    QRS Duration:    QT Interval:    QTC Calculation:   R Axis:      Text Interpretation:         Radiology No results found.  Medications Ordered in ED Medications - No data to display Procedures Procedures  (including critical care time) Medical Decision Making / ED Course   Medical Decision Making   ***    Final Clinical Impression(s) / ED Diagnoses Final diagnoses:  None    This chart was dictated using voice recognition software.  Despite best efforts to proofread,  errors can occur which can change the documentation meaning.

## 2022-08-30 NOTE — Telephone Encounter (Signed)
Patient is S/P 1 week post op from LTKA with Dr. Roda Shutters. Spoke with patient today who states he has had some urinary frequency on and off since surgery. He feels like he has to "force himself to urinate" and it's small amounts. His bladder feels full, but can't go but just little bits. Urine is darker than it has been as well. No fever/chills or burning. Spoke with Dr. Roda Shutters who recommended we start him on Cipro and CM will contact his PCP to inform them and see about an appointment. Waiting on call back. Would either of you prescribe antibiotic for patient since Dr. Roda Shutters is traveling today? Thank you.

## 2022-08-30 NOTE — Telephone Encounter (Signed)
Lft VM to rtn call. Dm/cma  

## 2022-08-31 ENCOUNTER — Encounter: Payer: Self-pay | Admitting: Family Medicine

## 2022-08-31 ENCOUNTER — Ambulatory Visit: Payer: Medicare PPO | Admitting: Family Medicine

## 2022-08-31 VITALS — BP 118/76 | HR 71 | Temp 98.3°F | Ht 68.0 in | Wt 195.0 lb

## 2022-08-31 DIAGNOSIS — R339 Retention of urine, unspecified: Secondary | ICD-10-CM | POA: Insufficient documentation

## 2022-08-31 DIAGNOSIS — Z96652 Presence of left artificial knee joint: Secondary | ICD-10-CM | POA: Diagnosis not present

## 2022-08-31 NOTE — Assessment & Plan Note (Signed)
Joshua Ruiz should continue to follow with orthopedics.

## 2022-08-31 NOTE — Progress Notes (Signed)
[facility] [address] [place] [address] Dept: [phone] Dept Fax: [phone]  Office Visit  Subjective:    Patient ID: Joshua Ruiz, male    DOB: 1940-02-28, 83 y.o..   MRN: [id number]  Chief Complaint  Patient presents with   Urinary Frequency    Went to ER 08/30/22.  Appt with Dr Liliane Shi on 09/08/22.     History of Present Illness:  Patient is in today reassessment after an ED visit from 08/30/2022. Joshua Ruiz underwent a left total knee joint replacement on 08/23/2022. He did have a Foley catheter in place during surgery, which was removed afterwards. He was discharged the following day. Over the last week, he had noted increasing difficulty with urination. By last night, he had developed some significant lower abdominal pain. In the ED, he had a Foley catheter placed, which produced 1300 ml of urine. He had contacted his orthopedist earlier int he day and was prescribed Ciprofloxacin. He took one dose last night, but has not continued this. He has an appointment to see Dr. Sande Brothers (urology) upcoming for 09/08/2022.  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   Chronic rhinitis 02/15/2022   Eczema 02/15/2022   Spondylolisthesis of lumbar region 11/20/2021   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/2021   Lumbar spondylosis 10/20/2021   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-L5 level 10/20/2021   Primary osteoarthritis of left knee 08/14/2021   Chronic epididymitis 08/13/2020   Hives 07/11/2020   Rheumatoid arthritis (HCC) 07/11/2020   Cervical arthritis 07/11/2020   Gastroesophageal reflux disease 07/11/2020   Presbyopia of both eyes 04/20/2015   Pseudophakia of both eyes 04/20/2015   Past Surgical History:  Procedure Laterality Date   COLONOSCOPY  2021   EYE SURGERY Bilateral    cataract extraction   HEMORRHOID SURGERY      LAMINECTOMY WITH POSTERIOR LATERAL ARTHRODESIS LEVEL 3 N/A 11/20/2021   Procedure: LUMBAR TWO THROUGH LUMBAR THREE, LUMBAR THREE THROUGH LUMBAR FOUR, LUMBAR FOUR THROUGH LUMBAR FIVE LAMINECTOMY WITH FACETECTOMY, POSTERIOR SEGMENTAL INSTRUMENTATION, POSTEROLATERAL ARTHRODESIS;  Surgeon: Lisbeth Renshaw, MD;  Location: [facility] OR;  Service: Neurosurgery;  Laterality: N/A;  3C   LYMPH NODE BIOPSY     Neck   MOHS SURGERY Left 2016   [facility] in [place]   TOTAL KNEE ARTHROPLASTY Left 08/23/2022   Procedure: LEFT TOTAL KNEE REPLACEMENT;  Surgeon: Tarry Kos, MD;  Location: [facility] OR;  Service: Orthopedics;  Laterality: Left;   TRIGGER FINGER RELEASE Right    3rd   Family History  Problem Relation Age of Onset   Rheum arthritis Mother    Diabetes Mother    Cancer Sister        Lung   Cancer Brother        Lung   Outpatient Medications Prior to Visit  Medication Sig Dispense Refill   aspirin EC 81 MG tablet Take 1 tablet (81 mg total) by mouth 2 (two) times daily. To be taken after surgery to prevent blood clots 84 tablet 0   azelastine (ASTELIN) 0.1 % nasal spray Place 1 spray into both nostrils 2 (two) times daily. 30 mL 12   cetirizine (ZYRTEC) 10 MG tablet Take 10 mg by mouth at bedtime.     ciprofloxacin (CIPRO) 500 MG tablet Take 1 tablet (500 mg total) by mouth 2 (two) times daily for 7 days. 14 tablet 0   docusate sodium (  COLACE) 100 MG capsule Take 1 capsule (100 mg total) by mouth daily as needed. 30 capsule 2   finasteride (PROSCAR) 5 MG tablet Take 5 mg by mouth daily.     fluticasone (FLONASE) 50 MCG/ACT nasal spray Place into both nostrils.     folic acid (FOLVITE) 1 MG tablet Take 1 mg by mouth daily.     hydroxychloroquine (PLAQUENIL) 200 MG tablet Take 200 mg by mouth 2 (two) times daily.     ipratropium (ATROVENT) 0.06 % nasal spray Place 1 spray into both nostrils 2 (two) times daily.     loratadine (CLARITIN) 10 MG tablet Take 10 mg by mouth in the morning.      methocarbamol (ROBAXIN) 500 MG tablet Take 1 tablet (500 mg total) by mouth every 6 (six) hours as needed for muscle spasms. 90 tablet 0   methotrexate (RHEUMATREX) 2.5 MG tablet Take 15 mg by mouth every Saturday. Caution:Chemotherapy. Protect from light.     mometasone (ELOCON) 0.1 % cream Apply topically daily. 15 g 1   Multiple Vitamin (MULTIVITAMIN WITH MINERALS) TABS tablet Take 1 tablet by mouth daily.     Multiple Vitamins-Minerals (PRESERVISION AREDS 2) CAPS Take 1 capsule by mouth in the morning and at bedtime.     omeprazole (PRILOSEC) 20 MG capsule Take 20 mg by mouth in the morning and at bedtime.     ondansetron (ZOFRAN) 4 MG tablet Take 1 tablet (4 mg total) by mouth every 8 (eight) hours as needed for nausea or vomiting. 40 tablet 0   oxyCODONE-acetaminophen (PERCOCET) 5-325 MG tablet Take 1-2 tablets by mouth every 6 (six) hours as needed. To be taken after surgery 40 tablet 0   sildenafil (VIAGRA) 100 MG tablet Take 100 mg by mouth daily as needed.     tadalafil (CIALIS) 5 MG tablet Take 5 mg by mouth daily.     No facility-administered medications prior to visit.   No Known Allergies   Objective:   Today's Vitals   08/31/22 1418  BP: 118/76  Pulse: 71  Temp: 98.3 F (36.8 C)  TempSrc: Temporal  SpO2: 99%  Weight: 195 lb (88.5 kg)  Height: 5\' 8"  (1.727 m)   Body mass index is 29.65 kg/m.   General: Well developed, well nourished. No acute distress. Extremities: Dressing over the left knee is dry and intake. There is significant swelling of the left leg and increased warmth.   No significant pain with palpation. Psych: Alert and oriented. Normal mood and affect.  Health Maintenance Due  Topic Date Due   DTaP/Tdap/Td (1 - Tdap) Never done   Medicare Annual Wellness (AWV)  10/22/2022   Lab Results    Latest Ref Rng & Units 08/30/2022   11:00 PM 08/24/2022   12:20 AM 08/12/2022   12:00 PM  CBC  WBC 4.0 - 10.5 K/uL 7.2  8.9  5.0   Hemoglobin 13.0 - 17.0  g/dL 45.4  09.8  11.9   Hematocrit 39.0 - 52.0 % 30.8  33.0  36.9   Platelets 150 - 400 K/uL 239  195  192        Latest Ref Rng & Units 08/30/2022   11:00 PM 08/12/2022   12:00 PM 06/17/2022   10:55 AM  CMP  Glucose 70 - 99 mg/dL 147  83  85   BUN 8 - 23 mg/dL 20  14  14    Creatinine 0.61 - 1.24 mg/dL 8.29  5.62  1.30   Sodium 135 -  145 mmol/L 139  140  140   Potassium 3.5 - 5.1 mmol/L 4.2  3.8  4.3   Chloride 98 - 111 mmol/L 107  108  105   CO2 22 - 32 mmol/L 22  25  28    Calcium 8.9 - 10.3 mg/dL 8.3  8.5  8.6   Total Protein 6.5 - 8.1 g/dL 6.7   6.6   Total Bilirubin 0.3 - 1.2 mg/dL 1.0   0.6   Alkaline Phos 38 - 126 U/L 74   103   AST 15 - 41 U/L 28   17   ALT 0 - 44 U/L 19   11    Component Ref Range & Units 1 d ago  Specimen Source URINE, CLEAN CATCH  Color, Urine YELLOW YELLOW  APPearance CLEAR CLEAR  Specific Gravity, Urine 1.005 - 1.030 1.020  pH 5.0 - 8.0 5.0  Glucose, UA NEGATIVE mg/dL NEGATIVE  Hgb urine dipstick NEGATIVE NEGATIVE  Bilirubin Urine NEGATIVE NEGATIVE  Ketones, ur NEGATIVE mg/dL NEGATIVE  Protein, ur NEGATIVE mg/dL NEGATIVE  Nitrite NEGATIVE NEGATIVE  Leukocytes,Ua NEGATIVE NEGATIVE  RBC / HPF 0 - 5 RBC/hpf 0-5  WBC, UA 0 - 5 WBC/hpf 0-5  Comment:        Reflex urine culture not performed if WBC <=10, OR if Squamous epithelial cells >5. If Squamous epithelial cells >5 suggest recollection.  Bacteria, UA NONE SEEN NONE SEEN  Squamous Epithelial / HPF 0 - 5 /HPF 0-5      Assessment & Plan:   Problem List Items Addressed This Visit       Genitourinary   Urinary retention - Primary    Foley in place and functioning normally. The UA from last night did not show any indication of infection, so do not feel he needs to resume ciprofloxacin. He should follow-up with urology as scheduled.        Other   Status post total left knee replacement    Mr. Hoggan should continue to follow with orthopedics.       Return in  about 4 weeks (around 09/28/2022) for Reassessment.   Loyola Mast, MD

## 2022-08-31 NOTE — Assessment & Plan Note (Addendum)
Foley in place and functioning normally. The UA from last night did not show any indication of infection, so do not feel he needs to resume ciprofloxacin. He should follow-up with urology as scheduled.

## 2022-09-02 ENCOUNTER — Telehealth: Payer: Self-pay | Admitting: *Deleted

## 2022-09-02 DIAGNOSIS — Z96652 Presence of left artificial knee joint: Secondary | ICD-10-CM | POA: Diagnosis not present

## 2022-09-02 DIAGNOSIS — M069 Rheumatoid arthritis, unspecified: Secondary | ICD-10-CM | POA: Diagnosis not present

## 2022-09-02 DIAGNOSIS — Z471 Aftercare following joint replacement surgery: Secondary | ICD-10-CM | POA: Diagnosis not present

## 2022-09-02 DIAGNOSIS — M48062 Spinal stenosis, lumbar region with neurogenic claudication: Secondary | ICD-10-CM | POA: Diagnosis not present

## 2022-09-02 DIAGNOSIS — C44301 Unspecified malignant neoplasm of skin of nose: Secondary | ICD-10-CM | POA: Diagnosis not present

## 2022-09-02 DIAGNOSIS — Z7982 Long term (current) use of aspirin: Secondary | ICD-10-CM | POA: Diagnosis not present

## 2022-09-02 DIAGNOSIS — K219 Gastro-esophageal reflux disease without esophagitis: Secondary | ICD-10-CM | POA: Diagnosis not present

## 2022-09-02 DIAGNOSIS — Z791 Long term (current) use of non-steroidal anti-inflammatories (NSAID): Secondary | ICD-10-CM | POA: Diagnosis not present

## 2022-09-02 DIAGNOSIS — C44209 Unspecified malignant neoplasm of skin of left ear and external auricular canal: Secondary | ICD-10-CM | POA: Diagnosis not present

## 2022-09-02 NOTE — Telephone Encounter (Signed)
Ortho bundle call to patient.

## 2022-09-06 DIAGNOSIS — M069 Rheumatoid arthritis, unspecified: Secondary | ICD-10-CM | POA: Diagnosis not present

## 2022-09-06 DIAGNOSIS — C44301 Unspecified malignant neoplasm of skin of nose: Secondary | ICD-10-CM | POA: Diagnosis not present

## 2022-09-06 DIAGNOSIS — Z96652 Presence of left artificial knee joint: Secondary | ICD-10-CM | POA: Diagnosis not present

## 2022-09-06 DIAGNOSIS — Z471 Aftercare following joint replacement surgery: Secondary | ICD-10-CM | POA: Diagnosis not present

## 2022-09-06 DIAGNOSIS — K219 Gastro-esophageal reflux disease without esophagitis: Secondary | ICD-10-CM | POA: Diagnosis not present

## 2022-09-06 DIAGNOSIS — C44209 Unspecified malignant neoplasm of skin of left ear and external auricular canal: Secondary | ICD-10-CM | POA: Diagnosis not present

## 2022-09-06 DIAGNOSIS — Z7982 Long term (current) use of aspirin: Secondary | ICD-10-CM | POA: Diagnosis not present

## 2022-09-06 DIAGNOSIS — Z791 Long term (current) use of non-steroidal anti-inflammatories (NSAID): Secondary | ICD-10-CM | POA: Diagnosis not present

## 2022-09-06 DIAGNOSIS — M48062 Spinal stenosis, lumbar region with neurogenic claudication: Secondary | ICD-10-CM | POA: Diagnosis not present

## 2022-09-07 ENCOUNTER — Other Ambulatory Visit: Payer: Self-pay | Admitting: Physician Assistant

## 2022-09-07 ENCOUNTER — Ambulatory Visit (INDEPENDENT_AMBULATORY_CARE_PROVIDER_SITE_OTHER): Payer: Medicare PPO | Admitting: Physician Assistant

## 2022-09-07 DIAGNOSIS — Z96652 Presence of left artificial knee joint: Secondary | ICD-10-CM

## 2022-09-07 NOTE — Progress Notes (Signed)
Post-Op Visit Note   Patient: Joshua Ruiz           Date of Birth: 01-08-1941           MRN: [id number] Visit Date: 09/07/2022 PCP: Loyola Mast, MD   Assessment & Plan:  Chief Complaint:  Chief Complaint  Patient presents with   Left Knee - Routine Post Op   Visit Diagnoses:  1. Status post total left knee replacement     Plan: Patient is a pleasant 82 year old male who comes in today 2 weeks status post left total knee replacement 08/23/2022.  He has been doing well in regards to the knee.  He has been getting home health physical therapy and is ambulating with a walker.  He is taking Tylenol for pain.  He has been compliant taking a baby aspirin twice daily for DVT prophylaxis.  Unfortunately, he developed urinary retention approximately 1 week postop.  He was placed on Cipro for possible infection.  He was seen in the ED where Foley catheter was placed.  He has an appointment and UTI ruled out.  He has an appointment to see urology tomorrow.  Examination of the left knee reveals a well healed surgical incision with nylon sutures in place.  No evidence of infection or cellulitis.  Calves are soft nontender.  He is neurovascularly intact distally.  Today, sutures were removed and Steri-Strips applied.  Outpatient physical therapy scheduled for this Thursday.  Continue taking baby aspirin twice daily for DVT prophylaxis for another 4 weeks.  Postop instructions handout provided.  Follow-up in 4 weeks for repeat evaluation and 2 view x-rays of the left knee.  Call with concerns or questions.  Follow-Up Instructions: Return in about 4 weeks (around 10/05/2022).   Orders:  No orders of the defined types were placed in this encounter.  No orders of the defined types were placed in this encounter.   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    Chronic rhinitis 02/15/2022   Eczema 02/15/2022   Spondylolisthesis of lumbar region 11/20/2021   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/2021   Lumbar spondylosis 10/20/2021   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-L5 level 10/20/2021   Primary osteoarthritis of left knee 08/14/2021   Chronic epididymitis 08/13/2020   Hives 07/11/2020   Rheumatoid arthritis (HCC) 07/11/2020   Cervical arthritis 07/11/2020   Gastroesophageal reflux disease 07/11/2020   Presbyopia of both eyes 04/20/2015   Pseudophakia of both eyes 04/20/2015   Past Medical History:  Diagnosis Date   Cancer (HCC)    Skin cancer left ear and nose   Complication of anesthesia    Sinus Infection after back surgery 11/2021   GERD (gastroesophageal reflux disease)    Rheumatoid arthritis (HCC)     Family History  Problem Relation Age of Onset   Rheum arthritis Mother    Diabetes Mother    Cancer Sister        Lung   Cancer Brother        Lung    Past Surgical History:  Procedure Laterality Date   COLONOSCOPY  2021   EYE SURGERY Bilateral    cataract extraction   HEMORRHOID SURGERY     LAMINECTOMY WITH POSTERIOR LATERAL ARTHRODESIS LEVEL 3 N/A 11/20/2021   Procedure: LUMBAR TWO THROUGH LUMBAR THREE, LUMBAR THREE THROUGH LUMBAR FOUR, LUMBAR FOUR THROUGH LUMBAR FIVE LAMINECTOMY WITH  FACETECTOMY, POSTERIOR SEGMENTAL INSTRUMENTATION, POSTEROLATERAL ARTHRODESIS;  Surgeon: Lisbeth Renshaw, MD;  Location: [facility] OR;  Service: Neurosurgery;  Laterality: N/A;  3C   LYMPH NODE BIOPSY     Neck   MOHS SURGERY Left 2016   [facility] in [place]   TOTAL KNEE ARTHROPLASTY Left 08/23/2022   Procedure: LEFT TOTAL KNEE REPLACEMENT;  Surgeon: Tarry Kos, MD;  Location: [facility] OR;  Service: Orthopedics;  Laterality: Left;   TRIGGER FINGER RELEASE Right    3rd   Social History   Occupational History   Not on file  Tobacco Use   Smoking status: Never    Passive exposure: Never   Smokeless tobacco: Never  Vaping  Use   Vaping status: Never Used  Substance and Sexual Activity   Alcohol use: Not Currently    Comment: none   Drug use: Never   Sexual activity: Yes

## 2022-09-08 DIAGNOSIS — R338 Other retention of urine: Secondary | ICD-10-CM | POA: Diagnosis not present

## 2022-09-08 NOTE — Therapy (Unsigned)
OUTPATIENT PHYSICAL THERAPY LOWER EXTREMITY EVALUATION   Patient Name: Joshua Ruiz MRN: [id number] DOB:November 01, 1940, 82 y.o., male Today's Date: 09/09/2022  END OF SESSION:  PT End of Session - 09/09/22 1438     Visit Number 1    Date for PT Re-Evaluation 12/02/22    PT Start Time 1445    PT Stop Time 1520    PT Time Calculation (min) 35 min    Activity Tolerance Patient tolerated treatment well;Patient limited by pain    Behavior During Therapy [facility] for tasks assessed/performed             Past Medical History:  Diagnosis Date   Cancer (HCC)    Skin cancer left ear and nose   Complication of anesthesia    Sinus Infection after back surgery 11/2021   GERD (gastroesophageal reflux disease)    Rheumatoid arthritis (HCC)    Past Surgical History:  Procedure Laterality Date   COLONOSCOPY  2021   EYE SURGERY Bilateral    cataract extraction   HEMORRHOID SURGERY     LAMINECTOMY WITH POSTERIOR LATERAL ARTHRODESIS LEVEL 3 N/A 11/20/2021   Procedure: LUMBAR TWO THROUGH LUMBAR THREE, LUMBAR THREE THROUGH LUMBAR FOUR, LUMBAR FOUR THROUGH LUMBAR FIVE LAMINECTOMY WITH FACETECTOMY, POSTERIOR SEGMENTAL INSTRUMENTATION, POSTEROLATERAL ARTHRODESIS;  Surgeon: Lisbeth Renshaw, MD;  Location: [facility] OR;  Service: Neurosurgery;  Laterality: N/A;  3C   LYMPH NODE BIOPSY     Neck   MOHS SURGERY Left 2016   [facility]ar. [facility] in [place]   TOTAL KNEE ARTHROPLASTY Left 08/23/2022   Procedure: LEFT TOTAL KNEE REPLACEMENT;  Surgeon: Tarry Kos, MD;  Location: [facility] OR;  Service: Orthopedics;  Laterality: Left;   TRIGGER FINGER RELEASE Right    3rd   Patient Active Problem List   Diagnosis Date Noted   Urinary retention 08/31/2022   Status post total left knee replacement 08/23/2022   Chronic rhinitis 02/15/2022   Eczema 02/15/2022   Spondylolisthesis of lumbar region 11/20/2021   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/2021   Lumbar spondylosis 10/20/2021    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-L5 level 10/20/2021   Primary osteoarthritis of left knee 08/14/2021   Chronic epididymitis 08/13/2020   Hives 07/11/2020   Rheumatoid arthritis (HCC) 07/11/2020   Cervical arthritis 07/11/2020   Gastroesophageal reflux disease 07/11/2020   Presbyopia of both eyes 04/20/2015   Pseudophakia of both eyes 04/20/2015    PCP: Loyola Mast, MD  REFERRING PROVIDER: Tarry Kos, MD  REFERRING DIAG:  Diagnosis  M17.12 (ICD-10-CM) - Primary osteoarthritis of left knee    THERAPY DIAG:  Difficulty in walking, not elsewhere classified  Muscle weakness (generalized)  S/P TKR (total knee replacement), left  Rationale for Evaluation and Treatment: Rehabilitation  ONSET DATE: 08/23/22  SUBJECTIVE:   SUBJECTIVE STATEMENT:   PERTINENT HISTORY: RA, GERD, back surgery,  LTKR 08/23/22 ER visit 08/30/22 due to urinary retention PAIN:  Are you having pain? Yes: NPRS scale: 4/10 Pain location: L knee Pain description: Muscle spasms Aggravating factors: fatigue Relieving factors: ice  PRECAUTIONS: Knee and Fall  RED FLAGS: None   WEIGHT BEARING RESTRICTIONS: Yes LLE WBAT  FALLS:  Has patient fallen in last 6 months? No  LIVING ENVIRONMENT: Lives with: lives with their family and lives alone Lives in: House/apartment Stairs: No Has following equipment at home: Dan Humphreys - 2 wheeled, shower chair, and bed side commode  OCCUPATION: Retired, works out, likes to walk  PLOF: Independent  PATIENT GOALS: Return to his walking routine, less pain,  recover his strength  NEXT MD VISIT: 4 weeks  OBJECTIVE:   DIAGNOSTIC FINDINGS:  Knee X ray /12/24 FINDINGS: Two-view show total knee arthroplasty. Components appear well positioned. No unexpected finding. Air and fluid in the operative region as expected.   IMPRESSION: Good appearance following total knee arthroplasty.  COGNITION: Overall cognitive status: Within functional limits for tasks  assessed     SENSATION: WFL  EDEMA:  Mod swelling in knee and lower leg, red and warm  POSTURE: rounded shoulders, forward head, and weight shift right  PALPATION: L knee tight and TTP with mild redness and swelling.  LOWER EXTREMITY ROM:  Passive ROM Right eval Left eval  Hip flexion    Hip extension    Hip abduction    Hip adduction    Hip internal rotation    Hip external rotation    Knee flexion  101  Knee extension  -20  Ankle dorsiflexion    Ankle plantarflexion    Ankle inversion    Ankle eversion     (Blank rows = not tested)  LOWER EXTREMITY MMT: RLE 5/5  MMT Right eval Left eval  Hip flexion  3+  Hip extension    Hip abduction    Hip adduction    Hip internal rotation    Hip external rotation  3/5  Knee flexion  3+  Knee extension  3-  Ankle dorsiflexion    Ankle plantarflexion    Ankle inversion    Ankle eversion     (Blank rows = not tested)  FUNCTIONAL TESTS:  5 times sit to stand: TBD Timed up and go (TUG): TBD  GAIT: Distance walked: In clinic distances Assistive device utilized: Environmental consultant - 2 wheeled Level of assistance: Modified independence Comments: Step to gait pattern, slow, cautious, antalgic on L.   TODAY'S TREATMENT:                                                                                                                              DATE:  09/09/22 Education HEP review-QS, HS, SAQ, Hip IR, SLR, SLR with hip ER, seated long kicks, seated knee flexion VASO x 10 min for L knee inflammation and pain   PATIENT EDUCATION:  Education details: POC, HEP Person educated: Patient Education method: Explanation Education comprehension: verbalized understanding  HOME EXERCISE PROGRAM: [id number]  ASSESSMENT:  CLINICAL IMPRESSION: Patient is a 82 y.o. who was seen today for physical therapy evaluation and treatment for L TKR 08/23/22. He has had some HHPT and now transitions to OP for continued care. He demonstrates swelling  in L knee as well as pain and weakness in LLE, decreased balance, decreased safety and I with all functional mobility. He will benefit from PT to address his deficits and promote return to his PLOF.  OBJECTIVE IMPAIRMENTS: Abnormal gait, decreased activity tolerance, decreased balance, decreased coordination, difficulty walking, decreased ROM, decreased strength, impaired flexibility, and pain.   ACTIVITY LIMITATIONS: carrying, lifting, bending,  sitting, standing, squatting, sleeping, stairs, and locomotion level  PARTICIPATION LIMITATIONS: meal prep, cleaning, laundry, driving, shopping, and community activity  PERSONAL FACTORS: Past/current experiences are also affecting patient's functional outcome.   REHAB POTENTIAL: Good  CLINICAL DECISION MAKING: Evolving/moderate complexity  EVALUATION COMPLEXITY: Moderate   GOALS: Goals reviewed with patient? Yes  SHORT TERM GOALS: Target date: 08/23/22 I with initial HEP Baseline: Goal status: INITIAL  2.  Increase R knee ROM to 5-100 Baseline:  Goal status: INITIAL  3.  Increase R knee strength to 3+/5 Baseline:  Goal status: INITIAL  LONG TERM GOALS: Target date: 12/02/22  I with final HEP Baseline:  Goal status: INITIAL  2.  Increase L knee strength to 5/5 Baseline:  Goal status: INITIAL  3.  Increase R knee ROM to 0-120 Baseline:  Goal status: INITIAL  4.  Patient will walk x at least 500' with LRAD, MI Baseline:  Goal status: INITIAL  5.  Complete 5 x STS in < 12 seconds with equal WB through BLE Baseline:  Goal status: INITIAL  6.  Complete TUG in < 12 sec with LRAD, MI Baseline:  Goal status: INITIAL   PLAN:  PT FREQUENCY: 2x/week  PT DURATION: 12 weeks  PLANNED INTERVENTIONS: Therapeutic exercises, Therapeutic activity, Neuromuscular re-education, Balance training, Gait training, Patient/Family education, Self Care, Joint mobilization, Cryotherapy, Moist heat, scar mobilization, Taping, Vasopneumatic  device, Ionotophoresis 4mg /ml Dexamethasone, and Manual therapy  PLAN FOR NEXT SESSION: Assess TUG and 5 x STS- deferred due to footwear on evaluation.   Iona Beard, DPT 09/09/2022, 4:19 PM

## 2022-09-09 ENCOUNTER — Encounter: Payer: Self-pay | Admitting: Physical Therapy

## 2022-09-09 ENCOUNTER — Ambulatory Visit: Payer: Medicare PPO | Attending: Orthopaedic Surgery | Admitting: Physical Therapy

## 2022-09-09 DIAGNOSIS — M6281 Muscle weakness (generalized): Secondary | ICD-10-CM | POA: Diagnosis not present

## 2022-09-09 DIAGNOSIS — Z96652 Presence of left artificial knee joint: Secondary | ICD-10-CM | POA: Diagnosis not present

## 2022-09-09 DIAGNOSIS — M1712 Unilateral primary osteoarthritis, left knee: Secondary | ICD-10-CM | POA: Diagnosis not present

## 2022-09-09 DIAGNOSIS — R262 Difficulty in walking, not elsewhere classified: Secondary | ICD-10-CM | POA: Diagnosis not present

## 2022-09-14 ENCOUNTER — Ambulatory Visit: Payer: Medicare PPO | Attending: Orthopaedic Surgery | Admitting: Physical Therapy

## 2022-09-14 DIAGNOSIS — Z96652 Presence of left artificial knee joint: Secondary | ICD-10-CM | POA: Insufficient documentation

## 2022-09-14 DIAGNOSIS — M6281 Muscle weakness (generalized): Secondary | ICD-10-CM | POA: Insufficient documentation

## 2022-09-14 DIAGNOSIS — R262 Difficulty in walking, not elsewhere classified: Secondary | ICD-10-CM | POA: Diagnosis not present

## 2022-09-14 DIAGNOSIS — R600 Localized edema: Secondary | ICD-10-CM | POA: Diagnosis not present

## 2022-09-14 NOTE — Therapy (Signed)
OUTPATIENT PHYSICAL THERAPY LOWER EXTREMITY    Patient Name: Joshua Ruiz MRN: [id number] DOB:07/31/1940, 82 y.o., male Today's Date: 09/14/2022  END OF SESSION:  PT End of Session - 09/14/22 1352     Visit Number 2    Date for PT Re-Evaluation 12/02/22    PT Start Time 1352    PT Stop Time 1450    PT Time Calculation (min) 58 min             Past Medical History:  Diagnosis Date   Cancer (HCC)    Skin cancer left ear and nose   Complication of anesthesia    Sinus Infection after back surgery 11/2021   GERD (gastroesophageal reflux disease)    Rheumatoid arthritis (HCC)    Past Surgical History:  Procedure Laterality Date   COLONOSCOPY  2021   EYE SURGERY Bilateral    cataract extraction   HEMORRHOID SURGERY     LAMINECTOMY WITH POSTERIOR LATERAL ARTHRODESIS LEVEL 3 N/A 11/20/2021   Procedure: LUMBAR TWO THROUGH LUMBAR THREE, LUMBAR THREE THROUGH LUMBAR FOUR, LUMBAR FOUR THROUGH LUMBAR FIVE LAMINECTOMY WITH FACETECTOMY, POSTERIOR SEGMENTAL INSTRUMENTATION, POSTEROLATERAL ARTHRODESIS;  Surgeon: Lisbeth Renshaw, MD;  Location: [facility] OR;  Service: Neurosurgery;  Laterality: N/A;  3C   LYMPH NODE BIOPSY     Neck   MOHS SURGERY Left 2016   Ear. [facility] in [place]   TOTAL KNEE ARTHROPLASTY Left 08/23/2022   Procedure: LEFT TOTAL KNEE REPLACEMENT;  Surgeon: Tarry Kos, MD;  Location: [facility] OR;  Service: Orthopedics;  Laterality: Left;   TRIGGER FINGER RELEASE Right    3rd   Patient Active Problem List   Diagnosis Date Noted   Urinary retention 08/31/2022   Status post total left knee replacement 08/23/2022   Chronic rhinitis 02/15/2022   Eczema 02/15/2022   Spondylolisthesis of lumbar region 11/20/2021   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/2021   Lumbar spondylosis 10/20/2021   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-L5 level 10/20/2021   Primary osteoarthritis of left knee 08/14/2021   Chronic epididymitis 08/13/2020   Hives 07/11/2020   Rheumatoid  arthritis (HCC) 07/11/2020   Cervical arthritis 07/11/2020   Gastroesophageal reflux disease 07/11/2020   Presbyopia of both eyes 04/20/2015   Pseudophakia of both eyes 04/20/2015    PCP: Loyola Mast, MD  REFERRING PROVIDER: Tarry Kos, MD  REFERRING DIAG:  Diagnosis  M17.12 (ICD-10-CM) - Primary osteoarthritis of left knee    THERAPY DIAG:  Difficulty in walking, not elsewhere classified  Muscle weakness (generalized)  S/P TKR (total knee replacement), left  Rationale for Evaluation and Treatment: Rehabilitation  ONSET DATE: 08/23/22  SUBJECTIVE:   SUBJECTIVE STATEMENT: knee is doing pretty well,hoping to get cath out in the morning   PERTINENT HISTORY: RA, GERD, back surgery,  LTKR 08/23/22 ER visit 08/30/22 due to urinary retention PAIN:  Are you having pain? Yes: NPRS scale: 3-4/10 Pain location: L knee Pain description: Muscle spasms Aggravating factors: fatigue Relieving factors: ice  PRECAUTIONS: Knee and Fall  RED FLAGS: None   WEIGHT BEARING RESTRICTIONS: Yes LLE WBAT  FALLS:  Has patient fallen in last 6 months? No  LIVING ENVIRONMENT: Lives with: lives with their family and lives alone Lives in: House/apartment Stairs: No Has following equipment at home: Dan Humphreys - 2 wheeled, shower chair, and bed side commode  OCCUPATION: Retired, works out, likes to walk  PLOF: Independent  PATIENT GOALS: Return to his walking routine, less pain, recover his strength  NEXT MD VISIT: 4 weeks  OBJECTIVE:   DIAGNOSTIC FINDINGS:  Knee X ray /12/24 FINDINGS: Two-view show total knee arthroplasty. Components appear well positioned. No unexpected finding. Air and fluid in the operative region as expected.   IMPRESSION: Good appearance following total knee arthroplasty.  COGNITION: Overall cognitive status: Within functional limits for tasks assessed     SENSATION: WFL  EDEMA:  Mod swelling in knee and lower leg, red and warm  POSTURE:  rounded shoulders, forward head, and weight shift right  PALPATION: L knee tight and TTP with mild redness and swelling.  LOWER EXTREMITY ROM:  Passive ROM Right eval Left eval  Hip flexion    Hip extension    Hip abduction    Hip adduction    Hip internal rotation    Hip external rotation    Knee flexion  101  Knee extension  -20  Ankle dorsiflexion    Ankle plantarflexion    Ankle inversion    Ankle eversion     (Blank rows = not tested)  LOWER EXTREMITY MMT: RLE 5/5  MMT Right eval Left eval  Hip flexion  3+  Hip extension    Hip abduction    Hip adduction    Hip internal rotation    Hip external rotation  3/5  Knee flexion  3+  Knee extension  3-  Ankle dorsiflexion    Ankle plantarflexion    Ankle inversion    Ankle eversion     (Blank rows = not tested)  FUNCTIONAL TESTS:  5 times sit to stand: TBD Timed up and go (TUG): TBD  GAIT: Distance walked: In clinic distances Assistive device utilized: Environmental consultant - 2 wheeled Level of assistance: Modified independence Comments: Step to gait pattern, slow, cautious, antalgic on L.   TODAY'S TREATMENT:                                                                                                                              DATE:   09/14/22 Nustep L 5 TUG and STS as not captured during eval but goals set 5 x STS 12.5 sec TUG with RW 14 sec Green tband HS curl seated 2 sets 10 Act flex 101 Pass 111 LAQ 3 sets 5 with PTA blocking hip flexor to prevent compensation TKE green tband 2 sets 10 Seated AROM   8  Pass 20 Fitter leg press 2 blue 2 sets 10 4inch step up with RW and PTA assist to facilitate knee ext 2 sets 10 Toe raises and calf stretching VASO medium with elevation for swelling 12 min psot ex and stretch    09/09/22 Education HEP review-QS, HS, SAQ, Hip IR, SLR, SLR with hip ER, seated long kicks, seated knee flexion VASO x 10 min for L knee inflammation and pain   PATIENT EDUCATION:   Education details: POC, HEP Person educated: Patient Education method: Explanation Education comprehension: verbalized understanding  HOME EXERCISE PROGRAM: [id number]  ASSESSMENT:  CLINICAL IMPRESSION: Pt  arrives with RW and cath with improved gait of knee flexion in swing phase. Still notable swelling. Assessed TUG and STS form eval and documented with goasl. Pt verb compliance with HEP. Pt with very good flexion, lacking knee ex and needs help to activate quad vs hip flexor. OBJECTIVE IMPAIRMENTS: Abnormal gait, decreased activity tolerance, decreased balance, decreased coordination, difficulty walking, decreased ROM, decreased strength, impaired flexibility, and pain.   ACTIVITY LIMITATIONS: carrying, lifting, bending, sitting, standing, squatting, sleeping, stairs, and locomotion level  PARTICIPATION LIMITATIONS: meal prep, cleaning, laundry, driving, shopping, and community activity  PERSONAL FACTORS: Past/current experiences are also affecting patient's functional outcome.   REHAB POTENTIAL: Good  CLINICAL DECISION MAKING: Evolving/moderate complexity  EVALUATION COMPLEXITY: Moderate   GOALS: Goals reviewed with patient? Yes  SHORT TERM GOALS: Target date: 08/23/22 I with initial HEP Baseline: Goal status: 09/14/22 met  2.  Increase R knee ROM to 5-100 Baseline:  Goal status: INITIAL  3.  Increase R knee strength to 3+/5 Baseline:  Goal status: INITIAL  LONG TERM GOALS: Target date: 12/02/22  I with final HEP Baseline:  Goal status: INITIAL  2.  Increase L knee strength to 5/5 Baseline:  Goal status: INITIAL  3.  Increase R knee ROM to 0-120 Baseline:  Goal status: INITIAL  4.  Patient will walk x at least 500' with LRAD, MI Baseline:  Goal status: INITIAL  5.  Complete 5 x STS in < 12 seconds with equal WB through BLE Baseline:  Goal status: 09/14/22 progressing  6.  Complete TUG in < 12 sec with LRAD, MI Baseline:  Goal status: 09/14/22  progressing   PLAN:  PT FREQUENCY: 2x/week  PT DURATION: 12 weeks  PLANNED INTERVENTIONS: Therapeutic exercises, Therapeutic activity, Neuromuscular re-education, Balance training, Gait training, Patient/Family education, Self Care, Joint mobilization, Cryotherapy, Moist heat, scar mobilization, Taping, Vasopneumatic device, Ionotophoresis 4mg /ml Dexamethasone, and Manual therapy  PLAN FOR NEXT SESSION: Assess TUG and 5 x STS- deferred due to footwear on evaluation.     Norman Piacentini,ANGIE, PTA 09/14/2022, 1:53 PM  [place] [facility] at [facility] [place], [place], [address] Phone: [phone]   Fax:  [phone]

## 2022-09-15 DIAGNOSIS — R338 Other retention of urine: Secondary | ICD-10-CM | POA: Diagnosis not present

## 2022-09-16 ENCOUNTER — Ambulatory Visit: Payer: Medicare PPO | Admitting: Physical Therapy

## 2022-09-16 DIAGNOSIS — Z96652 Presence of left artificial knee joint: Secondary | ICD-10-CM | POA: Diagnosis not present

## 2022-09-16 DIAGNOSIS — M6281 Muscle weakness (generalized): Secondary | ICD-10-CM | POA: Diagnosis not present

## 2022-09-16 DIAGNOSIS — R262 Difficulty in walking, not elsewhere classified: Secondary | ICD-10-CM | POA: Diagnosis not present

## 2022-09-16 DIAGNOSIS — R600 Localized edema: Secondary | ICD-10-CM | POA: Diagnosis not present

## 2022-09-16 NOTE — Therapy (Signed)
OUTPATIENT PHYSICAL THERAPY LOWER EXTREMITY    Patient Name: Joshua Ruiz MRN: [id number] DOB:31-May-1940, 82 y.o., male Today's Date: 09/16/2022  END OF SESSION:  PT End of Session - 09/16/22 1401     Visit Number 3    Date for PT Re-Evaluation 12/02/22    PT Start Time 1355    PT Stop Time 1445    PT Time Calculation (min) 50 min             Past Medical History:  Diagnosis Date   Cancer (HCC)    Skin cancer left ear and nose   Complication of anesthesia    Sinus Infection after back surgery 11/2021   GERD (gastroesophageal reflux disease)    Rheumatoid arthritis (HCC)    Past Surgical History:  Procedure Laterality Date   COLONOSCOPY  2021   EYE SURGERY Bilateral    cataract extraction   HEMORRHOID SURGERY     LAMINECTOMY WITH POSTERIOR LATERAL ARTHRODESIS LEVEL 3 N/A 11/20/2021   Procedure: LUMBAR TWO THROUGH LUMBAR THREE, LUMBAR THREE THROUGH LUMBAR FOUR, LUMBAR FOUR THROUGH LUMBAR FIVE LAMINECTOMY WITH FACETECTOMY, POSTERIOR SEGMENTAL INSTRUMENTATION, POSTEROLATERAL ARTHRODESIS;  Surgeon: Lisbeth Renshaw, MD;  Location: [facility];  Service: Neurosurgery;  Laterality: N/A;  3C   LYMPH NODE BIOPSY     Neck   MOHS SURGERY Left 2016   Ear. [facility] in [place]   TOTAL KNEE ARTHROPLASTY Left 08/23/2022   Procedure: LEFT TOTAL KNEE REPLACEMENT;  Surgeon: Tarry Kos, MD;  Location: [facility] OR;  Service: Orthopedics;  Laterality: Left;   TRIGGER FINGER RELEASE Right    3rd   Patient Active Problem List   Diagnosis Date Noted   Urinary retention 08/31/2022   Status post total left knee replacement 08/23/2022   Chronic rhinitis 02/15/2022   Eczema 02/15/2022   Spondylolisthesis of lumbar region 11/20/2021   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/2021   Lumbar spondylosis 10/20/2021   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-L5 level 10/20/2021   Primary osteoarthritis of left knee 08/14/2021   Chronic epididymitis 08/13/2020   Hives 07/11/2020   Rheumatoid  arthritis (HCC) 07/11/2020   Cervical arthritis 07/11/2020   Gastroesophageal reflux disease 07/11/2020   Presbyopia of both eyes 04/20/2015   Pseudophakia of both eyes 04/20/2015    PCP: Loyola Mast, MD  REFERRING PROVIDER: Tarry Kos, MD  REFERRING DIAG:  Diagnosis  M17.12 (ICD-10-CM) - Primary osteoarthritis of left knee    THERAPY DIAG:  Difficulty in walking, not elsewhere classified  Muscle weakness (generalized)  S/P TKR (total knee replacement), left  Rationale for Evaluation and Treatment: Rehabilitation  ONSET DATE: 08/23/22  SUBJECTIVE:   SUBJECTIVE STATEMENT:cath. out but rough,peeing a lot and back hurts -maybe kidneys. Take it easy on me today   PERTINENT HISTORY: RA, GERD, back surgery,  LTKR 08/23/22 ER visit 08/30/22 due to urinary retention PAIN:  Are you having pain? Yes: NPRS scale: 3-4/10 Pain location: L knee Pain description: Muscle spasms Aggravating factors: fatigue Relieving factors: ice  PRECAUTIONS: Knee and Fall  RED FLAGS: None   WEIGHT BEARING RESTRICTIONS: Yes LLE WBAT  FALLS:  Has patient fallen in last 6 months? No  LIVING ENVIRONMENT: Lives with: lives with their family and lives alone Lives in: House/apartment Stairs: No Has following equipment at home: Dan Humphreys - 2 wheeled, shower chair, and bed side commode  OCCUPATION: Retired, works out, likes to walk  PLOF: Independent  PATIENT GOALS: Return to his walking routine, less pain, recover his strength  NEXT MD  VISIT: 4 weeks  OBJECTIVE:   DIAGNOSTIC FINDINGS:  Knee X ray /12/24 FINDINGS: Two-view show total knee arthroplasty. Components appear well positioned. No unexpected finding. Air and fluid in the operative region as expected.   IMPRESSION: Good appearance following total knee arthroplasty.  COGNITION: Overall cognitive status: Within functional limits for tasks assessed     SENSATION: WFL  EDEMA:  Mod swelling in knee and lower leg, red  and warm  POSTURE: rounded shoulders, forward head, and weight shift right  PALPATION: L knee tight and TTP with mild redness and swelling.  LOWER EXTREMITY ROM:  Passive ROM Right eval Left eval  Hip flexion    Hip extension    Hip abduction    Hip adduction    Hip internal rotation    Hip external rotation    Knee flexion  101  Knee extension  -20  Ankle dorsiflexion    Ankle plantarflexion    Ankle inversion    Ankle eversion     (Blank rows = not tested)  LOWER EXTREMITY MMT: RLE 5/5  MMT Right eval Left eval  Hip flexion  3+  Hip extension    Hip abduction    Hip adduction    Hip internal rotation    Hip external rotation  3/5  Knee flexion  3+  Knee extension  3-  Ankle dorsiflexion    Ankle plantarflexion    Ankle inversion    Ankle eversion     (Blank rows = not tested)  FUNCTIONAL TESTS:  5 times sit to stand: TBD Timed up and go (TUG): TBD  GAIT: Distance walked: In clinic distances Assistive device utilized: Environmental consultant - 2 wheeled Level of assistance: Modified independence Comments: Step to gait pattern, slow, cautious, antalgic on L.   TODAY'S TREATMENT:                                                                                                                              DATE:   09/16/22 Nustep L 5 focus on TKE HS curl green tband 2 sets 10 LAQ yellow tband 2 sets 10- support to not engage hip flexor TKE red tband 2 sets 10 PROM to increased TKE SAQ with PTA to facilitate quad 2 sets 10   09/14/22 Nustep L 5 TUG and STS as not captured during eval but goals set 5 x STS 12.5 sec TUG with RW 14 sec Green tband HS curl seated 2 sets 10 Act flex 101 Pass 111 LAQ 3 sets 5 with PTA blocking hip flexor to prevent compensation TKE green tband 2 sets 10 Seated AROM   8  Pass 20 Fitter leg press 2 blue 2 sets 10 4inch step up with RW and PTA assist to facilitate knee ext 2 sets 10 Toe raises and calf stretching VASO medium  with elevation for swelling 12 min psot ex and stretch    09/09/22 Education HEP review-QS, HS, SAQ, Hip  IR, SLR, SLR with hip ER, seated long kicks, seated knee flexion VASO x 10 min for L knee inflammation and pain   PATIENT EDUCATION:  Education details: POC, HEP Person educated: Patient Education method: Explanation Education comprehension: verbalized understanding  HOME EXERCISE PROGRAM: [id number]  ASSESSMENT:  CLINICAL IMPRESSION: Pt arrives with RW , no cath but states feeling bad and request we take thsi session easy. Worked on ROM and strength with emphasis on quad and cuing to not activate hip flexor vs quad OBJECTIVE IMPAIRMENTS: Abnormal gait, decreased activity tolerance, decreased balance, decreased coordination, difficulty walking, decreased ROM, decreased strength, impaired flexibility, and pain.   ACTIVITY LIMITATIONS: carrying, lifting, bending, sitting, standing, squatting, sleeping, stairs, and locomotion level  PARTICIPATION LIMITATIONS: meal prep, cleaning, laundry, driving, shopping, and community activity  PERSONAL FACTORS: Past/current experiences are also affecting patient's functional outcome.   REHAB POTENTIAL: Good  CLINICAL DECISION MAKING: Evolving/moderate complexity  EVALUATION COMPLEXITY: Moderate   GOALS: Goals reviewed with patient? Yes  SHORT TERM GOALS: Target date: 08/23/22 I with initial HEP Baseline: Goal status: 09/14/22 met  2.  Increase R knee ROM to 5-100 Baseline:  Goal status: INITIAL  3.  Increase R knee strength to 3+/5 Baseline:  Goal status: INITIAL  LONG TERM GOALS: Target date: 12/02/22  I with final HEP Baseline:  Goal status: INITIAL  2.  Increase L knee strength to 5/5 Baseline:  Goal status: INITIAL  3.  Increase R knee ROM to 0-120 Baseline:  Goal status: INITIAL  4.  Patient will walk x at least 500' with LRAD, MI Baseline:  Goal status: INITIAL  5.  Complete 5 x STS in < 12 seconds with  equal WB through BLE Baseline:  Goal status: 09/14/22 progressing  6.  Complete TUG in < 12 sec with LRAD, MI Baseline:  Goal status: 09/14/22 progressing   PLAN:  PT FREQUENCY: 2x/week  PT DURATION: 12 weeks  PLANNED INTERVENTIONS: Therapeutic exercises, Therapeutic activity, Neuromuscular re-education, Balance training, Gait training, Patient/Family education, Self Care, Joint mobilization, Cryotherapy, Moist heat, scar mobilization, Taping, Vasopneumatic device, Ionotophoresis 4mg /ml Dexamethasone, and Manual therapy  PLAN FOR NEXT SESSION: assess and progress     Jerris Keltz,ANGIE, PTA 09/16/2022, 2:03 PM  [facility] at [facility] [address] [facility]. [place], [place], [address] Phone: [phone]   Fax:  [phone] at [facility] [address]. [place], [place], [address] Phone: [phone]   Fax:  [phone]  Patient Details  Name: Hershy Smolik MRN: [id number] Date of Birth: 09-30-1940 Referring Provider:  Tarry Kos, MD  Encounter Date: 09/16/2022   Suanne Marker, PTA 09/16/2022, 2:03 PM  [facility] at [facility] [address] [place], [place], [address] Phone: [phone]   Fax:  [phone]

## 2022-09-17 DIAGNOSIS — R338 Other retention of urine: Secondary | ICD-10-CM | POA: Diagnosis not present

## 2022-09-17 DIAGNOSIS — R8271 Bacteriuria: Secondary | ICD-10-CM | POA: Diagnosis not present

## 2022-09-17 DIAGNOSIS — N3000 Acute cystitis without hematuria: Secondary | ICD-10-CM | POA: Diagnosis not present

## 2022-09-21 ENCOUNTER — Ambulatory Visit: Payer: Medicare PPO | Admitting: Physical Therapy

## 2022-09-21 DIAGNOSIS — R600 Localized edema: Secondary | ICD-10-CM | POA: Diagnosis not present

## 2022-09-21 DIAGNOSIS — Z96652 Presence of left artificial knee joint: Secondary | ICD-10-CM | POA: Diagnosis not present

## 2022-09-21 DIAGNOSIS — R262 Difficulty in walking, not elsewhere classified: Secondary | ICD-10-CM | POA: Diagnosis not present

## 2022-09-21 DIAGNOSIS — M6281 Muscle weakness (generalized): Secondary | ICD-10-CM | POA: Diagnosis not present

## 2022-09-21 NOTE — Therapy (Signed)
OUTPATIENT PHYSICAL THERAPY LOWER EXTREMITY    Patient Name: Joshua Ruiz MRN: [id number] DOB:April 09, 1940, 82 y.o., male Today's Date: 09/21/2022  END OF SESSION:  PT End of Session - 09/21/22 1405     Visit Number 4    Date for PT Re-Evaluation 12/02/22    PT Start Time 1350    PT Stop Time 1440    PT Time Calculation (min) 50 min             Past Medical History:  Diagnosis Date   Cancer (HCC)    Skin cancer left ear and nose   Complication of anesthesia    Sinus Infection after back surgery 11/2021   GERD (gastroesophageal reflux disease)    Rheumatoid arthritis (HCC)    Past Surgical History:  Procedure Laterality Date   COLONOSCOPY  2021   EYE SURGERY Bilateral    cataract extraction   HEMORRHOID SURGERY     LAMINECTOMY WITH POSTERIOR LATERAL ARTHRODESIS LEVEL 3 N/A 11/20/2021   Procedure: LUMBAR TWO THROUGH LUMBAR THREE, LUMBAR THREE THROUGH LUMBAR FOUR, LUMBAR FOUR THROUGH LUMBAR FIVE LAMINECTOMY WITH FACETECTOMY, POSTERIOR SEGMENTAL INSTRUMENTATION, POSTEROLATERAL ARTHRODESIS;  Surgeon: Lisbeth Renshaw, MD;  Location: [facility];  Service: Neurosurgery;  Laterality: N/A;  3C   LYMPH NODE BIOPSY     Neck   MOHS SURGERY Left 2016   Ear. [facility] in [place]   TOTAL KNEE ARTHROPLASTY Left 08/23/2022   Procedure: LEFT TOTAL KNEE REPLACEMENT;  Surgeon: Tarry Kos, MD;  Location: [facility];  Service: Orthopedics;  Laterality: Left;   TRIGGER FINGER RELEASE Right    3rd   Patient Active Problem List   Diagnosis Date Noted   Urinary retention 08/31/2022   Status post total left knee replacement 08/23/2022   Chronic rhinitis 02/15/2022   Eczema 02/15/2022   Spondylolisthesis of lumbar region 11/20/2021   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/2021   Lumbar spondylosis 10/20/2021   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-L5 level 10/20/2021   Primary osteoarthritis of left knee 08/14/2021   Chronic epididymitis 08/13/2020   Hives 07/11/2020    Rheumatoid arthritis (HCC) 07/11/2020   Cervical arthritis 07/11/2020   Gastroesophageal reflux disease 07/11/2020   Presbyopia of both eyes 04/20/2015   Pseudophakia of both eyes 04/20/2015    PCP: Loyola Mast, MD  REFERRING PROVIDER: Tarry Kos, MD  REFERRING DIAG:  Diagnosis  M17.12 (ICD-10-CM) - Primary osteoarthritis of left knee    THERAPY DIAG:  Difficulty in walking, not elsewhere classified  Muscle weakness (generalized)  S/P TKR (total knee replacement), left  Rationale for Evaluation and Treatment: Rehabilitation  ONSET DATE: 08/23/22  SUBJECTIVE:   SUBJECTIVE STATEMENT:pt arrives today on RW with cath back in and n more antibiotics. Follows up tomorrow morning PERTINENT HISTORY: RA, GERD, back surgery,  LTKR 08/23/22 ER visit 08/30/22 due to urinary retention PAIN:  Are you having pain? Yes: NPRS scale: 3-4/10 Pain location: L knee Pain description: Muscle spasms Aggravating factors: fatigue Relieving factors: ice  PRECAUTIONS: Knee and Fall  RED FLAGS: None   WEIGHT BEARING RESTRICTIONS: Yes LLE WBAT  FALLS:  Has patient fallen in last 6 months? No  LIVING ENVIRONMENT: Lives with: lives with their family and lives alone Lives in: House/apartment Stairs: No Has following equipment at home: Dan Humphreys - 2 wheeled, shower chair, and bed side commode  OCCUPATION: Retired, works out, likes to walk  PLOF: Independent  PATIENT GOALS: Return to his walking routine, less pain, recover his strength  NEXT MD VISIT: 4  weeks  OBJECTIVE:   DIAGNOSTIC FINDINGS:  Knee X ray /12/24 FINDINGS: Two-view show total knee arthroplasty. Components appear well positioned. No unexpected finding. Air and fluid in the operative region as expected.   IMPRESSION: Good appearance following total knee arthroplasty.  COGNITION: Overall cognitive status: Within functional limits for tasks assessed     SENSATION: WFL  EDEMA:  Mod swelling in knee and  lower leg, red and warm  POSTURE: rounded shoulders, forward head, and weight shift right  PALPATION: L knee tight and TTP with mild redness and swelling.  LOWER EXTREMITY ROM:  Passive ROM Right eval Left eval  Hip flexion    Hip extension    Hip abduction    Hip adduction    Hip internal rotation    Hip external rotation    Knee flexion  101  Knee extension  -20  Ankle dorsiflexion    Ankle plantarflexion    Ankle inversion    Ankle eversion     (Blank rows = not tested)  LOWER EXTREMITY MMT: RLE 5/5  MMT Right eval Left eval  Hip flexion  3+  Hip extension    Hip abduction    Hip adduction    Hip internal rotation    Hip external rotation  3/5  Knee flexion  3+  Knee extension  3-  Ankle dorsiflexion    Ankle plantarflexion    Ankle inversion    Ankle eversion     (Blank rows = not tested)  FUNCTIONAL TESTS:  5 times sit to stand: TBD Timed up and go (TUG): TBD  GAIT: Distance walked: In clinic distances Assistive device utilized: Environmental consultant - 2 wheeled Level of assistance: Modified independence Comments: Step to gait pattern, slow, cautious, antalgic on L.   TODAY'S TREATMENT:                                                                                                                              DATE:   09/21/22 Nustep L 5 HS curl green tband 2 sets 10 LAQ 3# 2 sets 10 TKE standing green tband 2 sets 10 Black bar toe raises and heel stretch 2 sets 10 6 in step up 2 sets 10 focus on TKE PROM flex and ext with belt mobs for ext Amb with SPC 100 feet 2 x, good stride of step through , good balance but missing heel strike on left and decreased ext VASO 10 min at end of session for inflammation  09/16/22 Nustep L 5 focus on TKE HS curl green tband 2 sets 10 LAQ yellow tband 2 sets 10- support to not engage hip flexor TKE red tband 2 sets 10 PROM to increased TKE SAQ with PTA to facilitate quad 2 sets 10   09/14/22 Nustep L 5  TUG and STS as not captured during eval but goals set 5 x STS 12.5 sec TUG with RW 14 sec Green tband HS curl  seated 2 sets 10 Act flex 101 Pass 111 LAQ 3 sets 5 with PTA blocking hip flexor to prevent compensation TKE green tband 2 sets 10 Seated AROM   8  Pass 20 Fitter leg press 2 blue 2 sets 10 4inch step up with RW and PTA assist to facilitate knee ext 2 sets 10 Toe raises and calf stretching VASO medium with elevation for swelling 12 min psot ex and stretch    09/09/22 Education HEP review-QS, HS, SAQ, Hip IR, SLR, SLR with hip ER, seated long kicks, seated knee flexion VASO x 10 min for L knee inflammation and pain   PATIENT EDUCATION:  Education details: POC, HEP Person educated: Patient Education method: Explanation Education comprehension: verbalized understanding  HOME EXERCISE PROGRAM: [id number]  ASSESSMENT:  CLINICAL IMPRESSION: Pt arrives with RW and cath back in but states feels better than last week, his color is better and he is moving better. Able ot progress ex and gait with SPC with focus on TKE   OBJECTIVE IMPAIRMENTS: Abnormal gait, decreased activity tolerance, decreased balance, decreased coordination, difficulty walking, decreased ROM, decreased strength, impaired flexibility, and pain.   ACTIVITY LIMITATIONS: carrying, lifting, bending, sitting, standing, squatting, sleeping, stairs, and locomotion level  PARTICIPATION LIMITATIONS: meal prep, cleaning, laundry, driving, shopping, and community activity  PERSONAL FACTORS: Past/current experiences are also affecting patient's functional outcome.   REHAB POTENTIAL: Good  CLINICAL DECISION MAKING: Evolving/moderate complexity  EVALUATION COMPLEXITY: Moderate   GOALS: Goals reviewed with patient? Yes  SHORT TERM GOALS: Target date: 08/23/22 I with initial HEP Baseline: Goal status: 09/14/22 met  2.  Increase R knee ROM to 5-100 Baseline:  Goal status: INITIAL  3.  Increase R knee  strength to 3+/5 Baseline:  Goal status: INITIAL  LONG TERM GOALS: Target date: 12/02/22  I with final HEP Baseline:  Goal status: INITIAL  2.  Increase L knee strength to 5/5 Baseline:  Goal status: INITIAL  3.  Increase R knee ROM to 0-120 Baseline:  Goal status: INITIAL  4.  Patient will walk x at least 500' with LRAD, MI Baseline:  Goal status: INITIAL  5.  Complete 5 x STS in < 12 seconds with equal WB through BLE Baseline:  Goal status: 09/14/22 progressing  6.  Complete TUG in < 12 sec with LRAD, MI Baseline:  Goal status: 09/14/22 progressing   PLAN:  PT FREQUENCY: 2x/week  PT DURATION: 12 weeks  PLANNED INTERVENTIONS: Therapeutic exercises, Therapeutic activity, Neuromuscular re-education, Balance training, Gait training, Patient/Family education, Self Care, Joint mobilization, Cryotherapy, Moist heat, scar mobilization, Taping, Vasopneumatic device, Ionotophoresis 4mg /ml Dexamethasone, and Manual therapy  PLAN FOR NEXT SESSION: assess and progress     Raheen Capili,ANGIE, PTA 09/21/2022, 2:07 PM  [facility] at [facility] [address] [facility]. [place], [place], [address] Phone: [phone]   Fax:  [phone] at [facility] [address]. [place], [place], [address] Phone: [phone]   Fax:  [phone]  Patient Details  Name: Kaynon Polczynski MRN: [id number] Date of Birth: 02-Feb-1940 Referring Provider:  Tarry Kos, MD  Encounter Date: 09/21/2022   Suanne Marker, PTA 09/21/2022, 2:07 PM  [place] [facility] at [facility] [address] [facility] [place], [place], [address] Phone: [phone]   Fax:  [phone] at [facility] [address] [facility] [address]. [place], [place], [address] Phone: [phone]   Fax:  [phone]

## 2022-09-22 DIAGNOSIS — N3000 Acute cystitis without hematuria: Secondary | ICD-10-CM | POA: Diagnosis not present

## 2022-09-22 DIAGNOSIS — R338 Other retention of urine: Secondary | ICD-10-CM | POA: Diagnosis not present

## 2022-09-23 ENCOUNTER — Encounter: Payer: Self-pay | Admitting: Physical Therapy

## 2022-09-23 ENCOUNTER — Ambulatory Visit: Payer: Medicare PPO | Admitting: Physical Therapy

## 2022-09-23 DIAGNOSIS — R600 Localized edema: Secondary | ICD-10-CM

## 2022-09-23 DIAGNOSIS — M6281 Muscle weakness (generalized): Secondary | ICD-10-CM

## 2022-09-23 DIAGNOSIS — Z96652 Presence of left artificial knee joint: Secondary | ICD-10-CM

## 2022-09-23 DIAGNOSIS — R262 Difficulty in walking, not elsewhere classified: Secondary | ICD-10-CM

## 2022-09-23 NOTE — Therapy (Signed)
OUTPATIENT PHYSICAL THERAPY LOWER EXTREMITY    Patient Name: Joshua Ruiz MRN: [id number] DOB:Mar 22, 1940, 82 y.o., male Today's Date: 09/23/2022  END OF SESSION:  PT End of Session - 09/23/22 1350     Visit Number 5    Date for PT Re-Evaluation 12/05/22    Authorization Type [employer]    Authorization - Visit Number [email or web address]    PT Start Time 1350    PT Stop Time 1445    PT Time Calculation (min) 55 min    Activity Tolerance Patient tolerated treatment well;Patient limited by pain    Behavior During Therapy [facility] for tasks assessed/performed             Past Medical History:  Diagnosis Date   Cancer (HCC)    Skin cancer left ear and nose   Complication of anesthesia    Sinus Infection after back surgery 11/2021   GERD (gastroesophageal reflux disease)    Rheumatoid arthritis (HCC)    Past Surgical History:  Procedure Laterality Date   COLONOSCOPY  2021   EYE SURGERY Bilateral    cataract extraction   HEMORRHOID SURGERY     LAMINECTOMY WITH POSTERIOR LATERAL ARTHRODESIS LEVEL 3 N/A 11/20/2021   Procedure: LUMBAR TWO THROUGH LUMBAR THREE, LUMBAR THREE THROUGH LUMBAR FOUR, LUMBAR FOUR THROUGH LUMBAR FIVE LAMINECTOMY WITH FACETECTOMY, POSTERIOR SEGMENTAL INSTRUMENTATION, POSTEROLATERAL ARTHRODESIS;  Surgeon: Lisbeth Renshaw, MD;  Location: [facility] OR;  Service: Neurosurgery;  Laterality: N/A;  3C   LYMPH NODE BIOPSY     Neck   MOHS SURGERY Left 2016   Ear. [facility] in [place]   TOTAL KNEE ARTHROPLASTY Left 08/23/2022   Procedure: LEFT TOTAL KNEE REPLACEMENT;  Surgeon: Tarry Kos, MD;  Location: [facility] OR;  Service: Orthopedics;  Laterality: Left;   TRIGGER FINGER RELEASE Right    3rd   Patient Active Problem List   Diagnosis Date Noted   Urinary retention 08/31/2022   Status post total left knee replacement 08/23/2022   Chronic rhinitis 02/15/2022   Eczema 02/15/2022   Spondylolisthesis of lumbar region 11/20/2021   Lumbar stenosis with neurogenic claudication  10/20/2021   Lumbar spondylosis 10/20/2021   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-L5 level 10/20/2021   Primary osteoarthritis of left knee 08/14/2021   Chronic epididymitis 08/13/2020   Hives 07/11/2020   Rheumatoid arthritis (HCC) 07/11/2020   Cervical arthritis 07/11/2020   Gastroesophageal reflux disease 07/11/2020   Presbyopia of both eyes 04/20/2015   Pseudophakia of both eyes 04/20/2015    PCP: Loyola Mast, MD  REFERRING PROVIDER: Tarry Kos, MD  REFERRING DIAG:  Diagnosis  M17.12 (ICD-10-CM) - Primary osteoarthritis of left knee    THERAPY DIAG:  Difficulty in walking, not elsewhere classified  Muscle weakness (generalized)  S/P TKR (total knee replacement), left  Localized edema  Rationale for Evaluation and Treatment: Rehabilitation  ONSET DATE: 08/23/22  SUBJECTIVE:   SUBJECTIVE STATEMENT:Using a SPC today, reports that he got rid of the catheter yesterday PERTINENT HISTORY: RA, GERD, back surgery,  LTKR 08/23/22 ER visit 08/30/22 due to urinary retention PAIN:  Are you having pain? Yes: NPRS scale: 3/10 Pain location: L knee Pain description: Muscle spasms Aggravating factors: fatigue Relieving factors: ice  PRECAUTIONS: Knee and Fall  RED FLAGS: None   WEIGHT BEARING RESTRICTIONS: Yes LLE WBAT  FALLS:  Has patient fallen in last 6 months? No  LIVING ENVIRONMENT: Lives with: lives with their family and lives alone Lives in: House/apartment Stairs: No Has following equipment at home: Dan Humphreys - 2 wheeled,  shower chair, and bed side commode  OCCUPATION: Retired, works out, likes to walk  PLOF: Independent  PATIENT GOALS: Return to his walking routine, less pain, recover his strength  NEXT MD VISIT: 4 weeks  OBJECTIVE:   DIAGNOSTIC FINDINGS:  Knee X ray /12/24 FINDINGS: Two-view show total knee arthroplasty. Components appear well positioned. No unexpected finding. Air and fluid in the operative region as expected.   IMPRESSION: Good  appearance following total knee arthroplasty.  COGNITION: Overall cognitive status: Within functional limits for tasks assessed     SENSATION: WFL  EDEMA:  Mod swelling in knee and lower leg, red and warm  POSTURE: rounded shoulders, forward head, and weight shift right  PALPATION: L knee tight and TTP with mild redness and swelling.  LOWER EXTREMITY ROM:  Passive ROM Right eval Left eval  Hip flexion    Hip extension    Hip abduction    Hip adduction    Hip internal rotation    Hip external rotation    Knee flexion  101  Knee extension  -20  Ankle dorsiflexion    Ankle plantarflexion    Ankle inversion    Ankle eversion     (Blank rows = not tested)  LOWER EXTREMITY MMT: RLE 5/5  MMT Right eval Left eval  Hip flexion  3+  Hip extension    Hip abduction    Hip adduction    Hip internal rotation    Hip external rotation  3/5  Knee flexion  3+  Knee extension  3-  Ankle dorsiflexion    Ankle plantarflexion    Ankle inversion    Ankle eversion     (Blank rows = not tested)  FUNCTIONAL TESTS:  5 times sit to stand: TBD Timed up and go (TUG): TBD  GAIT: Distance walked: In clinic distances Assistive device utilized: Environmental consultant - 2 wheeled Level of assistance: Modified independence Comments: Step to gait pattern, slow, cautious, antalgic on L.   TODAY'S TREATMENT:                                                                                                                              DATE:  09/23/22 Nustep level 5 x 6 minutes Bike partial revs x 4 minutes HS curls both legs 25# 2x10 Leg press 20# 3x10 some cues to focus on the left TKE, left leg only no weight x 10 LAQ 3# 2x10 cues to get leg straight Gait SPC with CGA around the parking [employer] 2# cues for TKE left 2x10 Passive stretch into extension Vaso in elevation high pressure 36 degrees F  09/21/22 Nustep L 5 HS curl green tband 2 sets 10 LAQ 3# 2 sets 10 TKE standing green tband  2 sets 10 Black bar toe raises and heel stretch 2 sets 10 6 in step up 2 sets 10 focus on TKE PROM flex and ext with belt mobs for ext Amb with SPC 100  feet 2 x, good stride of step through , good balance but missing heel strike on left and decreased ext VASO 10 min at end of session for inflammation  09/16/22 Nustep L 5 focus on TKE HS curl green tband 2 sets 10 LAQ yellow tband 2 sets 10- support to not engage hip flexor TKE red tband 2 sets 10 PROM to increased TKE SAQ with PTA to facilitate quad 2 sets 10  09/14/22 Nustep L 5 TUG and STS as not captured during eval but goals set 5 x STS 12.5 sec TUG with RW 14 sec Green tband HS curl seated 2 sets 10 Act flex 101 Pass 111 LAQ 3 sets 5 with PTA blocking hip flexor to prevent compensation TKE green tband 2 sets 10 Seated AROM   8  Pass 20 Fitter leg press 2 blue 2 sets 10 4inch step up with RW and PTA assist to facilitate knee ext 2 sets 10 Toe raises and calf stretching VASO medium with elevation for swelling 12 min psot ex and stretch  09/09/22 Education HEP review-QS, HS, SAQ, Hip IR, SLR, SLR with hip ER, seated long kicks, seated knee flexion VASO x 10 min for L knee inflammation and pain   PATIENT EDUCATION:  Education details: POC, HEP Person educated: Patient Education method: Explanation Education comprehension: verbalized understanding  HOME EXERCISE PROGRAM: [id number]  ASSESSMENT:  CLINICAL IMPRESSION: Using SPC today, got rid of catheter yesterday, added some exercises and tried to focus on TKE, with walking he turns the foot out and does not get heel strike, has tightness posterior but also weak quads tends to use the hip flexor and not the quad  OBJECTIVE IMPAIRMENTS: Abnormal gait, decreased activity tolerance, decreased balance, decreased coordination, difficulty walking, decreased ROM, decreased strength, impaired flexibility, and pain.   ACTIVITY LIMITATIONS: carrying, lifting, bending,  sitting, standing, squatting, sleeping, stairs, and locomotion level  PARTICIPATION LIMITATIONS: meal prep, cleaning, laundry, driving, shopping, and community activity  PERSONAL FACTORS: Past/current experiences are also affecting patient's functional outcome.   REHAB POTENTIAL: Good  CLINICAL DECISION MAKING: Evolving/moderate complexity  EVALUATION COMPLEXITY: Moderate   GOALS: Goals reviewed with patient? Yes  SHORT TERM GOALS: Target date: 08/23/22 I with initial HEP Baseline: Goal status: 09/14/22 met  2.  Increase R knee ROM to 5-100 Baseline:  Goal status:  ongoing 09/23/22  3.  Increase R knee strength to 3+/5 Baseline:  Goal status: ongoing 09/23/22  LONG TERM GOALS: Target date: 12/02/22  I with final HEP Baseline:  Goal status: INITIAL  2.  Increase L knee strength to 5/5 Baseline:  Goal status: INITIAL  3.  Increase R knee ROM to 0-120 Baseline:  Goal status: INITIAL  4.  Patient will walk x at least 500' with LRAD, MI Baseline:  Goal status: INITIAL  5.  Complete 5 x STS in < 12 seconds with equal WB through BLE Baseline:  Goal status: 09/14/22 progressing  6.  Complete TUG in < 12 sec with LRAD, MI Baseline:  Goal status: 09/14/22 progressing   PLAN:  PT FREQUENCY: 2x/week  PT DURATION: 12 weeks  PLANNED INTERVENTIONS: Therapeutic exercises, Therapeutic activity, Neuromuscular re-education, Balance training, Gait training, Patient/Family education, Self Care, Joint mobilization, Cryotherapy, Moist heat, scar mobilization, Taping, Vasopneumatic device, Ionotophoresis 4mg /ml Dexamethasone, and Manual therapy  PLAN FOR NEXT SESSION: continue to push his ROM try to get his extension back  Dionis Autry W, PT 09/23/2022, 2:33 PM

## 2022-09-24 DIAGNOSIS — Z79899 Other long term (current) drug therapy: Secondary | ICD-10-CM | POA: Diagnosis not present

## 2022-09-28 ENCOUNTER — Ambulatory Visit: Payer: Medicare PPO | Admitting: Physical Therapy

## 2022-09-28 DIAGNOSIS — M6281 Muscle weakness (generalized): Secondary | ICD-10-CM | POA: Diagnosis not present

## 2022-09-28 DIAGNOSIS — R600 Localized edema: Secondary | ICD-10-CM

## 2022-09-28 DIAGNOSIS — Z96652 Presence of left artificial knee joint: Secondary | ICD-10-CM

## 2022-09-28 DIAGNOSIS — R262 Difficulty in walking, not elsewhere classified: Secondary | ICD-10-CM

## 2022-09-28 NOTE — Therapy (Signed)
OUTPATIENT PHYSICAL THERAPY LOWER EXTREMITY    Patient Name: Joshua Ruiz MRN: [id number] DOB:1940-09-17, 82 y.o., male Today's Date: 09/28/2022  END OF SESSION:  PT End of Session - 09/28/22 1349     Visit Number 6    Date for PT Re-Evaluation 12/05/22    Authorization Type [employer]    PT Start Time 1350    PT Stop Time 1445    PT Time Calculation (min) 55 min             Past Medical History:  Diagnosis Date   Cancer (HCC)    Skin cancer left ear and nose   Complication of anesthesia    Sinus Infection after back surgery 11/2021   GERD (gastroesophageal reflux disease)    Rheumatoid arthritis (HCC)    Past Surgical History:  Procedure Laterality Date   COLONOSCOPY  2021   EYE SURGERY Bilateral    cataract extraction   HEMORRHOID SURGERY     LAMINECTOMY WITH POSTERIOR LATERAL ARTHRODESIS LEVEL 3 N/A 11/20/2021   Procedure: LUMBAR TWO THROUGH LUMBAR THREE, LUMBAR THREE THROUGH LUMBAR FOUR, LUMBAR FOUR THROUGH LUMBAR FIVE LAMINECTOMY WITH FACETECTOMY, POSTERIOR SEGMENTAL INSTRUMENTATION, POSTEROLATERAL ARTHRODESIS;  Surgeon: Lisbeth Renshaw, MD;  Location: [facility];  Service: Neurosurgery;  Laterality: N/A;  3C   LYMPH NODE BIOPSY     Neck   MOHS SURGERY Left 2016   Ear. [facility] in [place]   TOTAL KNEE ARTHROPLASTY Left 08/23/2022   Procedure: LEFT TOTAL KNEE REPLACEMENT;  Surgeon: Tarry Kos, MD;  Location: [facility] OR;  Service: Orthopedics;  Laterality: Left;   TRIGGER FINGER RELEASE Right    3rd   Patient Active Problem List   Diagnosis Date Noted   Urinary retention 08/31/2022   Status post total left knee replacement 08/23/2022   Chronic rhinitis 02/15/2022   Eczema 02/15/2022   Spondylolisthesis of lumbar region 11/20/2021   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/2021   Lumbar spondylosis 10/20/2021   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-L5 level 10/20/2021   Primary osteoarthritis of left knee 08/14/2021   Chronic epididymitis 08/13/2020    Hives 07/11/2020   Rheumatoid arthritis (HCC) 07/11/2020   Cervical arthritis 07/11/2020   Gastroesophageal reflux disease 07/11/2020   Presbyopia of both eyes 04/20/2015   Pseudophakia of both eyes 04/20/2015    PCP: Loyola Mast, MD  REFERRING PROVIDER: Tarry Kos, MD  REFERRING DIAG:  Diagnosis  M17.12 (ICD-10-CM) - Primary osteoarthritis of left knee    THERAPY DIAG:  Difficulty in walking, not elsewhere classified  Muscle weakness (generalized)  S/P TKR (total knee replacement), left  Localized edema  Rationale for Evaluation and Treatment: Rehabilitation  ONSET DATE: 08/23/22  SUBJECTIVE:   SUBJECTIVE STATEMENT:Using a SPC today, states not using much in house. Knee feels good PERTINENT HISTORY: RA, GERD, back surgery,  LTKR 08/23/22 ER visit 08/30/22 due to urinary retention PAIN:  Are you having pain? Yes: NPRS scale: 3/10 Pain location: L knee Pain description: Muscle spasms Aggravating factors: fatigue Relieving factors: ice  PRECAUTIONS: Knee and Fall  RED FLAGS: None   WEIGHT BEARING RESTRICTIONS: Yes LLE WBAT  FALLS:  Has patient fallen in last 6 months? No  LIVING ENVIRONMENT: Lives with: lives with their family and lives alone Lives in: House/apartment Stairs: No Has following equipment at home: Dan Humphreys - 2 wheeled, shower chair, and bed side commode  OCCUPATION: Retired, works out, likes to walk  PLOF: Independent  PATIENT GOALS: Return to his walking routine, less pain, recover his strength  NEXT MD VISIT: 4 weeks  OBJECTIVE:   DIAGNOSTIC FINDINGS:  Knee X ray /12/24 FINDINGS: Two-view show total knee arthroplasty. Components appear well positioned. No unexpected finding. Air and fluid in the operative region as expected.   IMPRESSION: Good appearance following total knee arthroplasty.  COGNITION: Overall cognitive status: Within functional limits for tasks assessed     SENSATION: WFL  EDEMA:  Mod swelling in  knee and lower leg, red and warm  POSTURE: rounded shoulders, forward head, and weight shift right  PALPATION: L knee tight and TTP with mild redness and swelling.  LOWER EXTREMITY ROM:  Passive ROM Right eval Left eval  Hip flexion    Hip extension    Hip abduction    Hip adduction    Hip internal rotation    Hip external rotation    Knee flexion  101  Knee extension  -20  Ankle dorsiflexion    Ankle plantarflexion    Ankle inversion    Ankle eversion     (Blank rows = not tested)  LOWER EXTREMITY MMT: RLE 5/5  MMT Right eval Left eval  Hip flexion  3+  Hip extension    Hip abduction    Hip adduction    Hip internal rotation    Hip external rotation  3/5  Knee flexion  3+  Knee extension  3-  Ankle dorsiflexion    Ankle plantarflexion    Ankle inversion    Ankle eversion     (Blank rows = not tested)  FUNCTIONAL TESTS:  5 times sit to stand: TBD Timed up and go (TUG): TBD  GAIT: Distance walked: In clinic distances Assistive device utilized: Environmental consultant - 2 wheeled Level of assistance: Modified independence Comments: Step to gait pattern, slow, cautious, antalgic on L.   TODAY'S TREATMENT:                                                                                                                              DATE:   09/28/22 Bike L 3 full revolutions Nustep L 5 5 min LE only HS curls both legs 25# 2x10 LLE only 10x 20# 5# LLE knee ext 3 sets 5 Black bar DF and PF 15 x each 30# resisted gait backward 10 x for TKE Amb without AD working on NOT ER left foot and increased cadeance TKE long sitting with green tband 2 sets 10 PROM flex and ext with emphasis on ext SL leg press 20# focus on TKE 2 sets 10- seat #9 weakness with push off Fitter ext 2 sets 10    09/23/22 Nustep level 5 x 6 minutes Bike partial revs x 4 minutes HS curls both legs 25# 2x10 Leg press 20# 3x10 some cues to focus on the left TKE, left leg only no weight x 10 LAQ 3#  2x10 cues to get leg straight Gait SPC with CGA around the parking [employer] 2# cues for TKE left 2x10  Passive stretch into extension Vaso in elevation high pressure 36 degrees F  09/21/22 Nustep L 5 HS curl green tband 2 sets 10 LAQ 3# 2 sets 10 TKE standing green tband 2 sets 10 Black bar toe raises and heel stretch 2 sets 10 6 in step up 2 sets 10 focus on TKE PROM flex and ext with belt mobs for ext Amb with SPC 100 feet 2 x, good stride of step through , good balance but missing heel strike on left and decreased ext VASO 10 min at end of session for inflammation  09/16/22 Nustep L 5 focus on TKE HS curl green tband 2 sets 10 LAQ yellow tband 2 sets 10- support to not engage hip flexor TKE red tband 2 sets 10 PROM to increased TKE SAQ with PTA to facilitate quad 2 sets 10  09/14/22 Nustep L 5 TUG and STS as not captured during eval but goals set 5 x STS 12.5 sec TUG with RW 14 sec Green tband HS curl seated 2 sets 10 Act flex 101 Pass 111 LAQ 3 sets 5 with PTA blocking hip flexor to prevent compensation TKE green tband 2 sets 10 Seated AROM   8  Pass 20 Fitter leg press 2 blue 2 sets 10 4inch step up with RW and PTA assist to facilitate knee ext 2 sets 10 Toe raises and calf stretching VASO medium with elevation for swelling 12 min psot ex and stretch  09/09/22 Education HEP review-QS, HS, SAQ, Hip IR, SLR, SLR with hip ER, seated long kicks, seated knee flexion VASO x 10 min for L knee inflammation and pain   PATIENT EDUCATION:  Education details: POC, HEP Person educated: Patient Education method: Explanation Education comprehension: verbalized understanding  HOME EXERCISE PROGRAM: [id number]  ASSESSMENT:  CLINICAL IMPRESSION: Overall pt is progressing well. Still weakness in quad with compensation so worked on this as well as worked on decrease ER with gait- cuing verb and tactile needed. OBJECTIVE IMPAIRMENTS: Abnormal gait, decreased  activity tolerance, decreased balance, decreased coordination, difficulty walking, decreased ROM, decreased strength, impaired flexibility, and pain.   ACTIVITY LIMITATIONS: carrying, lifting, bending, sitting, standing, squatting, sleeping, stairs, and locomotion level  PARTICIPATION LIMITATIONS: meal prep, cleaning, laundry, driving, shopping, and community activity  PERSONAL FACTORS: Past/current experiences are also affecting patient's functional outcome.   REHAB POTENTIAL: Good  CLINICAL DECISION MAKING: Evolving/moderate complexity  EVALUATION COMPLEXITY: Moderate   GOALS: Goals reviewed with patient? Yes  SHORT TERM GOALS: Target date: 08/23/22 I with initial HEP Baseline: Goal status: 09/14/22 met  2.  Increase R knee ROM to 5-100 Baseline:  Goal status:  ongoing 09/23/22  3.  Increase R knee strength to 3+/5 Baseline:  Goal status: ongoing 09/23/22  LONG TERM GOALS: Target date: 12/02/22  I with final HEP Baseline:  Goal status: INITIAL  2.  Increase L knee strength to 5/5 Baseline:  Goal status: INITIAL  3.  Increase R knee ROM to 0-120 Baseline:  Goal status: INITIAL  4.  Patient will walk x at least 500' with LRAD, MI Baseline:  Goal status: INITIAL  5.  Complete 5 x STS in < 12 seconds with equal WB through BLE Baseline:  Goal status: 09/14/22 progressing  6.  Complete TUG in < 12 sec with LRAD, MI Baseline:  Goal status: 09/14/22 progressing   PLAN:  PT FREQUENCY: 2x/week  PT DURATION: 12 weeks  PLANNED INTERVENTIONS: Therapeutic exercises, Therapeutic activity, Neuromuscular re-education, Balance training, Gait training, Patient/Family education,  Self Care, Joint mobilization, Cryotherapy, Moist heat, scar mobilization, Taping, Vasopneumatic device, Ionotophoresis 4mg /ml Dexamethasone, and Manual therapy  PLAN FOR NEXT SESSION: continue to push his ROM try to get his extension back. Assess goals  Arvil Utz,ANGIE, PTA 09/28/2022, 1:49 PM  [facility] [address] [facility] [place], [place], [address] Phone: [phone]   Fax:  [phone]  Patient Details  Name: Joshua Ruiz MRN: [id number] Date of Birth: 08-25-1940 Referring Provider:  Tarry Kos, MD  Encounter Date: 09/28/2022   Suanne Marker, PTA 09/28/2022, 1:49 PM  [facility] [address] [facility] [facility] [place], [place], [address] Phone: [phone]   Fax:  [phone]

## 2022-09-29 ENCOUNTER — Ambulatory Visit: Payer: Medicare PPO | Admitting: Family Medicine

## 2022-09-29 ENCOUNTER — Encounter: Payer: Self-pay | Admitting: Family Medicine

## 2022-09-29 VITALS — BP 132/70 | HR 74 | Temp 97.9°F | Ht 68.0 in | Wt 196.0 lb

## 2022-09-29 DIAGNOSIS — Z96652 Presence of left artificial knee joint: Secondary | ICD-10-CM

## 2022-09-29 DIAGNOSIS — H43813 Vitreous degeneration, bilateral: Secondary | ICD-10-CM | POA: Insufficient documentation

## 2022-09-29 DIAGNOSIS — H353131 Nonexudative age-related macular degeneration, bilateral, early dry stage: Secondary | ICD-10-CM | POA: Insufficient documentation

## 2022-09-29 DIAGNOSIS — R200 Anesthesia of skin: Secondary | ICD-10-CM

## 2022-09-29 DIAGNOSIS — R202 Paresthesia of skin: Secondary | ICD-10-CM | POA: Diagnosis not present

## 2022-09-29 DIAGNOSIS — R339 Retention of urine, unspecified: Secondary | ICD-10-CM

## 2022-09-29 DIAGNOSIS — Z79899 Other long term (current) drug therapy: Secondary | ICD-10-CM | POA: Insufficient documentation

## 2022-09-29 NOTE — Assessment & Plan Note (Signed)
Mr. Rumbley should continue to follow with orthopedics.

## 2022-09-29 NOTE — Progress Notes (Signed)
[facility] [address] [place] [address] Dept: [phone] Dept Fax: [phone]  Office Visit  Subjective:    Patient ID: Joshua Ruiz, male    DOB: March 17, 1940, 82 y.o..   MRN: [id number]  Chief Complaint  Patient presents with   Follow-up    4 week f/u.  C/o still having some numbness in his finger tips.  Stopped taking the vitamins.    History of Present Illness:  Patient is in today for reassessment of his urinary retention. I had last seen him on 8/20 in follow-up from an ED visit. Mr. Cullimore underwent a left total knee joint replacement on 08/23/2022. He did have a Foley catheter in place during surgery, which was removed afterwards. He was discharged the following day. Over the next week, he had noted increasing difficulty with urination progressing to significant lower abdominal pain. In the ED on 8/19, he had a Foley catheter placed, which produced 1300 ml of urine. He had contacted his orthopedist earlier in the day and was prescribed Ciprofloxacin. He was subsequently seen by urology. Mr. Frelich notes it took three attempts to be able to leave the Foley catheter out. He is glad this is improved. He is still getting up 1-2 times a night to urinate. He is managed on tamsulosin 0.4 mg daily and tadalafil 5 mg daily.  Mr. Bowersock has noted issues with numbness of his fingertips. he feels this excludes his thumbs, but seems to involve the other fingers. He does not note any of this extending into the hands.  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/2022   Chronic rhinitis 02/15/2022   Eczema 02/15/2022   Spondylolisthesis of lumbar region 11/20/2021   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/2021   Lumbar spondylosis 10/20/2021   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-L5 level 10/20/2021   Primary  osteoarthritis of left knee 08/14/2021   Chronic epididymitis 08/13/2020   Hives 07/11/2020   Rheumatoid arthritis (HCC) 07/11/2020   Cervical arthritis 07/11/2020   Gastroesophageal reflux disease 07/11/2020   Presbyopia of both eyes 04/20/2015   Pseudophakia of both eyes 04/20/2015   Past Surgical History:  Procedure Laterality Date   COLONOSCOPY  2021   EYE SURGERY Bilateral    cataract extraction   HEMORRHOID SURGERY     LAMINECTOMY WITH POSTERIOR LATERAL ARTHRODESIS LEVEL 3 N/A 11/20/2021   Procedure: LUMBAR TWO THROUGH LUMBAR THREE, LUMBAR THREE THROUGH LUMBAR FOUR, LUMBAR FOUR THROUGH LUMBAR FIVE LAMINECTOMY WITH FACETECTOMY, POSTERIOR SEGMENTAL INSTRUMENTATION, POSTEROLATERAL ARTHRODESIS;  Surgeon: Lisbeth Renshaw, MD;  Location: [facility] OR;  Service: Neurosurgery;  Laterality: N/A;  3C   LYMPH NODE BIOPSY     Neck   MOHS SURGERY Left 2016   [facility]ar[facility] in [place]   TOTAL KNEE ARTHROPLASTY Left 08/23/2022   Procedure: LEFT TOTAL KNEE REPLACEMENT;  Surgeon: Tarry Kos, MD;  Location: [facility] OR;  Service: Orthopedics;  Laterality: Left;   TRIGGER FINGER RELEASE Right    3rd   Family History  Problem Relation Age of Onset   Rheum arthritis Mother    Diabetes Mother    Cancer Sister        Lung   Cancer Brother        Lung   Outpatient Medications Prior to Visit  Medication Sig Dispense Refill   aspirin EC 81 MG tablet Take 1 tablet (81 mg total) by  mouth 2 (two) times daily. To be taken after surgery to prevent blood clots 84 tablet 0   azelastine (ASTELIN) 0.1 % nasal spray Place 1 spray into both nostrils 2 (two) times daily. 30 mL 12   cetirizine (ZYRTEC) 10 MG tablet Take 10 mg by mouth at bedtime.     ciprofloxacin (CIPRO) 500 MG tablet Take 500 mg by mouth 2 (two) times daily.     docusate sodium (COLACE) 100 MG capsule Take 1 capsule (100 mg total) by mouth daily as needed. 30 capsule 2   finasteride (PROSCAR) 5 MG tablet Take 5 mg by mouth daily.      fluticasone (FLONASE) 50 MCG/ACT nasal spray Place into both nostrils.     folic acid (FOLVITE) 1 MG tablet Take 1 mg by mouth daily.     hydroxychloroquine (PLAQUENIL) 200 MG tablet Take 200 mg by mouth 2 (two) times daily.     ipratropium (ATROVENT) 0.06 % nasal spray Place 1 spray into both nostrils 2 (two) times daily.     loratadine (CLARITIN) 10 MG tablet Take 10 mg by mouth in the morning.     methocarbamol (ROBAXIN) 500 MG tablet Take 1 tablet (500 mg total) by mouth every 6 (six) hours as needed for muscle spasms. 90 tablet 0   methotrexate (RHEUMATREX) 2.5 MG tablet Take 15 mg by mouth every Saturday. Caution:Chemotherapy. Protect from light.     mometasone (ELOCON) 0.1 % cream Apply topically daily. 15 g 1   Multiple Vitamin (MULTIVITAMIN WITH MINERALS) TABS tablet Take 1 tablet by mouth daily.     Multiple Vitamins-Minerals (PRESERVISION AREDS 2) CAPS Take 1 capsule by mouth in the morning and at bedtime.     omeprazole (PRILOSEC) 20 MG capsule Take 20 mg by mouth in the morning and at bedtime.     ondansetron (ZOFRAN) 4 MG tablet Take 1 tablet (4 mg total) by mouth every 8 (eight) hours as needed for nausea or vomiting. 40 tablet 0   oxyCODONE-acetaminophen (PERCOCET) 5-325 MG tablet Take 1-2 tablets by mouth every 6 (six) hours as needed. To be taken after surgery 40 tablet 0   sildenafil (VIAGRA) 100 MG tablet Take 100 mg by mouth daily as needed.     tadalafil (CIALIS) 5 MG tablet Take 5 mg by mouth daily.     tamsulosin (FLOMAX) 0.4 MG CAPS capsule Take 0.4 mg by mouth at bedtime.     No facility-administered medications prior to visit.   No Known Allergies   Objective:   Today's Vitals   09/29/22 1313  BP: 132/70  Pulse: 74  Temp: 97.9 F (36.6 C)  TempSrc: Temporal  SpO2: 100%  Weight: 196 lb (88.9 kg)  Height: 5\' 8"  (1.727 m)   Body mass index is 29.8 kg/m.   General: Well developed, well nourished. No acute distress. Neuro: CN II-XII intact. Normal sensation  and DTR bilaterally. Psych: Alert and oriented. Normal mood and affect.  Health Maintenance Due  Topic Date Due   Zoster Vaccines- Shingrix (1 of 2) Never done   Medicare Annual Wellness (AWV)  10/22/2022     Assessment & Plan:   Problem List Items Addressed This Visit       Genitourinary   Urinary retention    Resolved. He should continue tamsulosin 0.4 mg dialy and tadalafil 5 mg daily for potential prostate enlargement.        Other   Numbness and tingling of fingertips    Etiology is unclear. Patient  notes he ahd blood testing elsewhere and was told vitmain levels are normal. I recommend he discuss with rheumatology as to whether this could be a side effect of his MTX or Plaquenil.       Status post total left knee replacement - Primary    Mr. Pell should continue to follow with orthopedics.       Return in about 6 months (around 03/29/2023) for Reassessment.   Loyola Mast, MD

## 2022-09-29 NOTE — Assessment & Plan Note (Signed)
Resolved. He should continue tamsulosin 0.4 mg dialy and tadalafil 5 mg daily for potential prostate enlargement.

## 2022-09-29 NOTE — Assessment & Plan Note (Signed)
Etiology is unclear. Patient notes he ahd blood testing elsewhere and was told vitmain levels are normal. I recommend he discuss with rheumatology as to whether this could be a side effect of his MTX or Plaquenil.

## 2022-09-30 ENCOUNTER — Encounter: Payer: Self-pay | Admitting: Physical Therapy

## 2022-09-30 ENCOUNTER — Ambulatory Visit: Payer: Medicare PPO | Admitting: Physical Therapy

## 2022-09-30 DIAGNOSIS — R262 Difficulty in walking, not elsewhere classified: Secondary | ICD-10-CM

## 2022-09-30 DIAGNOSIS — R600 Localized edema: Secondary | ICD-10-CM | POA: Diagnosis not present

## 2022-09-30 DIAGNOSIS — M6281 Muscle weakness (generalized): Secondary | ICD-10-CM

## 2022-09-30 DIAGNOSIS — Z96652 Presence of left artificial knee joint: Secondary | ICD-10-CM | POA: Diagnosis not present

## 2022-09-30 NOTE — Therapy (Signed)
OUTPATIENT PHYSICAL THERAPY LOWER EXTREMITY    Patient Name: Joshua Ruiz MRN: [id number] DOB:04-Aug-1940, 82 y.o., male Today's Date: 09/30/2022  END OF SESSION:  PT End of Session - 09/30/22 1352     Visit Number 7    Date for PT Re-Evaluation 12/05/22    Authorization Type [employer]    PT Start Time 1351    PT Stop Time 1446    PT Time Calculation (min) 55 min    Activity Tolerance Patient tolerated treatment well;Patient limited by pain    Behavior During Therapy [facility] for tasks assessed/performed             Past Medical History:  Diagnosis Date   Cancer (HCC)    Skin cancer left ear and nose   Complication of anesthesia    Sinus Infection after back surgery 11/2021   GERD (gastroesophageal reflux disease)    Rheumatoid arthritis (HCC)    Past Surgical History:  Procedure Laterality Date   COLONOSCOPY  2021   EYE SURGERY Bilateral    cataract extraction   HEMORRHOID SURGERY     LAMINECTOMY WITH POSTERIOR LATERAL ARTHRODESIS LEVEL 3 N/A 11/20/2021   Procedure: LUMBAR TWO THROUGH LUMBAR THREE, LUMBAR THREE THROUGH LUMBAR FOUR, LUMBAR FOUR THROUGH LUMBAR FIVE LAMINECTOMY WITH FACETECTOMY, POSTERIOR SEGMENTAL INSTRUMENTATION, POSTEROLATERAL ARTHRODESIS;  Surgeon: Lisbeth Renshaw, MD;  Location: [facility] OR;  Service: Neurosurgery;  Laterality: N/A;  3C   LYMPH NODE BIOPSY     Neck   MOHS SURGERY Left 2016   [facility] in [place]   TOTAL KNEE ARTHROPLASTY Left 08/23/2022   Procedure: LEFT TOTAL KNEE REPLACEMENT;  Surgeon: Tarry Kos, MD;  Location: [facility] OR;  Service: Orthopedics;  Laterality: Left;   TRIGGER FINGER RELEASE Right    3rd   Patient Active Problem List   Diagnosis Date Noted   Numbness and tingling of fingertips 09/29/2022   Urinary retention 08/31/2022   Status post total left knee replacement 08/23/2022   Chronic rhinitis 02/15/2022   Eczema 02/15/2022   Spondylolisthesis of lumbar region 11/20/2021   Lumbar stenosis with neurogenic  claudication 10/20/2021   Lumbar spondylosis 10/20/2021   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-L5 level 10/20/2021   Primary osteoarthritis of left knee 08/14/2021   Chronic epididymitis 08/13/2020   Hives 07/11/2020   Rheumatoid arthritis (HCC) 07/11/2020   Cervical arthritis 07/11/2020   Gastroesophageal reflux disease 07/11/2020   Presbyopia of both eyes 04/20/2015   Pseudophakia of both eyes 04/20/2015    PCP: Loyola Mast, MD  REFERRING PROVIDER: Tarry Kos, MD  REFERRING DIAG:  Diagnosis  M17.12 (ICD-10-CM) - Primary osteoarthritis of left knee    THERAPY DIAG:  Difficulty in walking, not elsewhere classified  Muscle weakness (generalized)  S/P TKR (total knee replacement), left  Localized edema  Rationale for Evaluation and Treatment: Rehabilitation  ONSET DATE: 08/23/22  SUBJECTIVE:   SUBJECTIVE STATEMENT:  Still slow, but using the cane for the most part PERTINENT HISTORY: RA, GERD, back surgery,  LTKR 08/23/22 ER visit 08/30/22 due to urinary retention PAIN:  Are you having pain? Yes: NPRS scale: 3/10 Pain location: L knee Pain description: Muscle spasms Aggravating factors: fatigue Relieving factors: ice  PRECAUTIONS: Knee and Fall  RED FLAGS: None   WEIGHT BEARING RESTRICTIONS: Yes LLE WBAT  FALLS:  Has patient fallen in last 6 months? No  LIVING ENVIRONMENT: Lives with: lives with their family and lives alone Lives in: House/apartment Stairs: No Has following equipment at home: Dan Humphreys - 2 wheeled, shower  chair, and bed side commode  OCCUPATION: Retired, works out, likes to walk  PLOF: Independent  PATIENT GOALS: Return to his walking routine, less pain, recover his strength  NEXT MD VISIT: 4 weeks  OBJECTIVE:   DIAGNOSTIC FINDINGS:  Knee X ray /12/24 FINDINGS: Two-view show total knee arthroplasty. Components appear well positioned. No unexpected finding. Air and fluid in the operative region as expected.   IMPRESSION: Good  appearance following total knee arthroplasty.  COGNITION: Overall cognitive status: Within functional limits for tasks assessed     SENSATION: WFL  EDEMA:  Mod swelling in knee and lower leg, red and warm  POSTURE: rounded shoulders, forward head, and weight shift right  PALPATION: L knee tight and TTP with mild redness and swelling.  LOWER EXTREMITY ROM:  Passive ROM Right eval Left eval Left AROM 09/30/22  Hip flexion     Hip extension     Hip abduction     Hip adduction     Hip internal rotation     Hip external rotation     Knee flexion  101 114  Knee extension  -20 12  Ankle dorsiflexion     Ankle plantarflexion     Ankle inversion     Ankle eversion      (Blank rows = not tested)  LOWER EXTREMITY MMT: RLE 5/5  MMT Right eval Left eval  Hip flexion  3+  Hip extension    Hip abduction    Hip adduction    Hip internal rotation    Hip external rotation  3/5  Knee flexion  3+  Knee extension  3-  Ankle dorsiflexion    Ankle plantarflexion    Ankle inversion    Ankle eversion     (Blank rows = not tested)  FUNCTIONAL TESTS:  5 times sit to stand: TBD Timed up and go (TUG): TBD  GAIT: Distance walked: In clinic distances Assistive device utilized: Environmental consultant - 2 wheeled Level of assistance: Modified independence Comments: Step to gait pattern, slow, cautious, antalgic on L.   TODAY'S TREATMENT:                                                                                                                              DATE:  09/30/22 Nustep level 5 x 6 minutes Bike level 3 x 5 minutes Gait outside with a SPC around the parking island Leg curls 25# 2x10, 15# left only 2 x10 Leg extension 5# 2x10 Leg press 20# 2x10, then left only x10 then no weight x10 Passive stretch flexion and extension, scar mobilization Vaso medium pressure in elevation left knee 35 degrees F  09/28/22 Bike L 3 full revolutions Nustep L 5 5 min LE only HS curls both  legs 25# 2x10 LLE only 10x 20# 5# LLE knee ext 3 sets 5 Black bar DF and PF 15 x each 30# resisted gait backward 10 x for TKE Amb without AD working on  NOT ER left foot and increased cadeance TKE long sitting with green tband 2 sets 10 PROM flex and ext with emphasis on ext SL leg press 20# focus on TKE 2 sets 10- seat #9 weakness with push off Fitter ext 2 sets 10    09/23/22 Nustep level 5 x 6 minutes Bike partial revs x 4 minutes HS curls both legs 25# 2x10 Leg press 20# 3x10 some cues to focus on the left TKE, left leg only no weight x 10 LAQ 3# 2x10 cues to get leg straight Gait SPC with CGA around the parking [employer] 2# cues for TKE left 2x10 Passive stretch into extension Vaso in elevation high pressure 36 degrees F  09/21/22 Nustep L 5 HS curl green tband 2 sets 10 LAQ 3# 2 sets 10 TKE standing green tband 2 sets 10 Black bar toe raises and heel stretch 2 sets 10 6 in step up 2 sets 10 focus on TKE PROM flex and ext with belt mobs for ext Amb with SPC 100 feet 2 x, good stride of step through , good balance but missing heel strike on left and decreased ext VASO 10 min at end of session for inflammation  09/16/22 Nustep L 5 focus on TKE HS curl green tband 2 sets 10 LAQ yellow tband 2 sets 10- support to not engage hip flexor TKE red tband 2 sets 10 PROM to increased TKE SAQ with PTA to facilitate quad 2 sets 10  09/14/22 Nustep L 5 TUG and STS as not captured during eval but goals set 5 x STS 12.5 sec TUG with RW 14 sec Green tband HS curl seated 2 sets 10 Act flex 101 Pass 111 LAQ 3 sets 5 with PTA blocking hip flexor to prevent compensation TKE green tband 2 sets 10 Seated AROM   8  Pass 20 Fitter leg press 2 blue 2 sets 10 4inch step up with RW and PTA assist to facilitate knee ext 2 sets 10 Toe raises and calf stretching VASO medium with elevation for swelling 12 min psot ex and stretch  09/09/22 Education HEP review-QS, HS, SAQ, Hip  IR, SLR, SLR with hip ER, seated long kicks, seated knee flexion VASO x 10 min for L knee inflammation and pain   PATIENT EDUCATION:  Education details: POC, HEP Person educated: Patient Education method: Explanation Education comprehension: verbalized understanding  HOME EXERCISE PROGRAM: [id number]  ASSESSMENT:  CLINICAL IMPRESSION: On the walk today did not need any rest, by the end of the walk he had more of a limp and was not straightening it out fully, after PROM he made very good improvements in AROM, still limited mostly with extension.  Has warmth and swelling in the knee so we finished with vaso OBJECTIVE IMPAIRMENTS: Abnormal gait, decreased activity tolerance, decreased balance, decreased coordination, difficulty walking, decreased ROM, decreased strength, impaired flexibility, and pain.   ACTIVITY LIMITATIONS: carrying, lifting, bending, sitting, standing, squatting, sleeping, stairs, and locomotion level  PARTICIPATION LIMITATIONS: meal prep, cleaning, laundry, driving, shopping, and community activity  PERSONAL FACTORS: Past/current experiences are also affecting patient's functional outcome.   REHAB POTENTIAL: Good  CLINICAL DECISION MAKING: Evolving/moderate complexity  EVALUATION COMPLEXITY: Moderate   GOALS: Goals reviewed with patient? Yes  SHORT TERM GOALS: Target date: 08/23/22 I with initial HEP Baseline: Goal status: 09/14/22 met  2.  Increase R knee ROM to 5-100 Baseline:  Goal status:  progressing 09/30/22  3.  Increase R knee strength to 3+/5 Baseline:  Goal status: ongoing 09/23/22  LONG TERM GOALS: Target date: 12/02/22  I with final HEP Baseline:  Goal status: INITIAL  2.  Increase L knee strength to 5/5 Baseline:  Goal status: INITIAL  3.  Increase R knee ROM to 0-120 Baseline:  Goal status: INITIAL  4.  Patient will walk x at least 500' with LRAD, MI Baseline:  Goal status: progressing 09/30/22  5.  Complete 5 x STS in < 12  seconds with equal WB through BLE Baseline:  Goal status: 09/14/22 progressing  6.  Complete TUG in < 12 sec with LRAD, MI Baseline:  Goal status: 09/14/22 progressing   PLAN:  PT FREQUENCY: 2x/week  PT DURATION: 12 weeks  PLANNED INTERVENTIONS: Therapeutic exercises, Therapeutic activity, Neuromuscular re-education, Balance training, Gait training, Patient/Family education, Self Care, Joint mobilization, Cryotherapy, Moist heat, scar mobilization, Taping, Vasopneumatic device, Ionotophoresis 4mg /ml Dexamethasone, and Manual therapy  PLAN FOR NEXT SESSION: continue to push his ROM try to get his extension back.  Jearld Lesch, PT 09/30/2022, 1:52 PM  [facility] [address] [facility] [place], [place], [address] Phone: [phone]   Fax:  [phone]

## 2022-10-05 ENCOUNTER — Ambulatory Visit (INDEPENDENT_AMBULATORY_CARE_PROVIDER_SITE_OTHER): Payer: Self-pay

## 2022-10-05 ENCOUNTER — Ambulatory Visit (INDEPENDENT_AMBULATORY_CARE_PROVIDER_SITE_OTHER): Payer: Medicare PPO | Admitting: Physician Assistant

## 2022-10-05 ENCOUNTER — Encounter: Payer: Self-pay | Admitting: Physical Therapy

## 2022-10-05 ENCOUNTER — Ambulatory Visit: Payer: Medicare PPO | Admitting: Physical Therapy

## 2022-10-05 DIAGNOSIS — R262 Difficulty in walking, not elsewhere classified: Secondary | ICD-10-CM

## 2022-10-05 DIAGNOSIS — Z96652 Presence of left artificial knee joint: Secondary | ICD-10-CM

## 2022-10-05 DIAGNOSIS — M6281 Muscle weakness (generalized): Secondary | ICD-10-CM

## 2022-10-05 DIAGNOSIS — R600 Localized edema: Secondary | ICD-10-CM

## 2022-10-05 NOTE — Therapy (Signed)
OUTPATIENT PHYSICAL THERAPY LOWER EXTREMITY    Patient Name: Joshua Ruiz MRN: [id number] DOB:06-30-1940, 82 y.o., male Today's Date: 10/05/2022  END OF SESSION:  PT End of Session - 10/05/22 1356     Visit Number 8    Date for PT Re-Evaluation 12/05/22    Authorization Type [employer]    PT Start Time 1350    PT Stop Time 1441    PT Time Calculation (min) 51 min    Activity Tolerance Patient tolerated treatment well    Behavior During Therapy WFL for tasks assessed/performed             Past Medical History:  Diagnosis Date   Cancer (HCC)    Skin cancer left ear and nose   Complication of anesthesia    Sinus Infection after back surgery 11/2021   GERD (gastroesophageal reflux disease)    Rheumatoid arthritis (HCC)    Past Surgical History:  Procedure Laterality Date   COLONOSCOPY  2021   EYE SURGERY Bilateral    cataract extraction   HEMORRHOID SURGERY     LAMINECTOMY WITH POSTERIOR LATERAL ARTHRODESIS LEVEL 3 N/A 11/20/2021   Procedure: LUMBAR TWO THROUGH LUMBAR THREE, LUMBAR THREE THROUGH LUMBAR FOUR, LUMBAR FOUR THROUGH LUMBAR FIVE LAMINECTOMY WITH FACETECTOMY, POSTERIOR SEGMENTAL INSTRUMENTATION, POSTEROLATERAL ARTHRODESIS;  Surgeon: Lisbeth Renshaw, MD;  Location: [facility] OR;  Service: Neurosurgery;  Laterality: N/A;  3C   LYMPH NODE BIOPSY     Neck   MOHS SURGERY Left 2016   Ear. [facility] in [place]   TOTAL KNEE ARTHROPLASTY Left 08/23/2022   Procedure: LEFT TOTAL KNEE REPLACEMENT;  Surgeon: Tarry Kos, MD;  Location: [facility] OR;  Service: Orthopedics;  Laterality: Left;   TRIGGER FINGER RELEASE Right    3rd   Patient Active Problem List   Diagnosis Date Noted   Numbness and tingling of fingertips 09/29/2022   Urinary retention 08/31/2022   Status post total left knee replacement 08/23/2022   Chronic rhinitis 02/15/2022   Eczema 02/15/2022   Spondylolisthesis of lumbar region 11/20/2021   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/2021    Lumbar spondylosis 10/20/2021   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-L5 level 10/20/2021   Primary osteoarthritis of left knee 08/14/2021   Chronic epididymitis 08/13/2020   Hives 07/11/2020   Rheumatoid arthritis (HCC) 07/11/2020   Cervical arthritis 07/11/2020   Gastroesophageal reflux disease 07/11/2020   Presbyopia of both eyes 04/20/2015   Pseudophakia of both eyes 04/20/2015    PCP: Loyola Mast, MD  REFERRING PROVIDER: Tarry Kos, MD  REFERRING DIAG:  Diagnosis  M17.12 (ICD-10-CM) - Primary osteoarthritis of left knee    THERAPY DIAG:  Difficulty in walking, not elsewhere classified  Muscle weakness (generalized)  S/P TKR (total knee replacement), left  Localized edema  Rationale for Evaluation and Treatment: Rehabilitation  ONSET DATE: 08/23/22  SUBJECTIVE:   SUBJECTIVE STATEMENT:  saw the MD today, very pleased, not using cane today, will return to MD in 6 weeks PERTINENT HISTORY: RA, GERD, back surgery,  LTKR 08/23/22 ER visit 08/30/22 due to urinary retention PAIN:  Are you having pain? Yes: NPRS scale: 3/10 Pain location: L knee Pain description: Muscle spasms Aggravating factors: fatigue Relieving factors: ice  PRECAUTIONS: Knee and Fall  RED FLAGS: None   WEIGHT BEARING RESTRICTIONS: Yes LLE WBAT  FALLS:  Has patient fallen in last 6 months? No  LIVING ENVIRONMENT: Lives with: lives with their family and lives alone Lives in: House/apartment Stairs: No Has following equipment at home: Dan Humphreys -  2 wheeled, shower chair, and bed side commode  OCCUPATION: Retired, works out, likes to walk  PLOF: Independent  PATIENT GOALS: Return to his walking routine, less pain, recover his strength  NEXT MD VISIT: 4 weeks  OBJECTIVE:   DIAGNOSTIC FINDINGS:  Knee X ray /12/24 FINDINGS: Two-view show total knee arthroplasty. Components appear well positioned. No unexpected finding. Air and fluid in the operative region as expected.    IMPRESSION: Good appearance following total knee arthroplasty.  COGNITION: Overall cognitive status: Within functional limits for tasks assessed     SENSATION: WFL  EDEMA:  Mod swelling in knee and lower leg, red and warm  POSTURE: rounded shoulders, forward head, and weight shift right  PALPATION: L knee tight and TTP with mild redness and swelling.  LOWER EXTREMITY ROM:  Passive ROM Right eval Left eval Left AROM 09/30/22  Hip flexion     Hip extension     Hip abduction     Hip adduction     Hip internal rotation     Hip external rotation     Knee flexion  101 114  Knee extension  -20 12  Ankle dorsiflexion     Ankle plantarflexion     Ankle inversion     Ankle eversion      (Blank rows = not tested)  LOWER EXTREMITY MMT: RLE 5/5  MMT Right eval Left eval  Hip flexion  3+  Hip extension    Hip abduction    Hip adduction    Hip internal rotation    Hip external rotation  3/5  Knee flexion  3+  Knee extension  3-  Ankle dorsiflexion    Ankle plantarflexion    Ankle inversion    Ankle eversion     (Blank rows = not tested)  FUNCTIONAL TESTS:  5 times sit to stand: TBD Timed up and go (TUG): TBD  GAIT: Distance walked: In clinic distances Assistive device utilized: Environmental consultant - 2 wheeled Level of assistance: Modified independence Comments: Step to gait pattern, slow, cautious, antalgic on L.   TODAY'S TREATMENT:                                                                                                                              DATE:  10/05/22 Bike level 4 x 5 minutes Gait around the back building no device, slow, decreased knee extension, 1 rest brace Resisted gait all directions 40# leg curls left only 15# 3x10 with passive stretch b/n sets 5# leg extension left only small ROM focus on TKE Leg press 40# both legs x10, left only 20# 2x8 Passive stretch flexion and extension Vaso medium pressure 36 degrees  09/30/22 Nustep level 5 x 6  minutes Bike level 3 x 5 minutes Gait outside with a SPC around the parking island Leg curls 25# 2x10, 15# left only 2 x10 Leg extension 5# 2x10 Leg press 20# 2x10, then left only x10 then no weight  x10 Passive stretch flexion and extension, scar mobilization Vaso medium pressure in elevation left knee 35 degrees F  09/28/22 Bike L 3 full revolutions Nustep L 5 5 min LE only HS curls both legs 25# 2x10 LLE only 10x 20# 5# LLE knee ext 3 sets 5 Black bar DF and PF 15 x each 30# resisted gait backward 10 x for TKE Amb without AD working on NOT ER left foot and increased cadeance TKE long sitting with green tband 2 sets 10 PROM flex and ext with emphasis on ext SL leg press 20# focus on TKE 2 sets 10- seat #9 weakness with push off Fitter ext 2 sets 10    09/23/22 Nustep level 5 x 6 minutes Bike partial revs x 4 minutes HS curls both legs 25# 2x10 Leg press 20# 3x10 some cues to focus on the left TKE, left leg only no weight x 10 LAQ 3# 2x10 cues to get leg straight Gait SPC with CGA around the parking [employer] 2# cues for TKE left 2x10 Passive stretch into extension Vaso in elevation high pressure 36 degrees F  09/21/22 Nustep L 5 HS curl green tband 2 sets 10 LAQ 3# 2 sets 10 TKE standing green tband 2 sets 10 Black bar toe raises and heel stretch 2 sets 10 6 in step up 2 sets 10 focus on TKE PROM flex and ext with belt mobs for ext Amb with SPC 100 feet 2 x, good stride of step through , good balance but missing heel strike on left and decreased ext VASO 10 min at end of session for inflammation  09/16/22 Nustep L 5 focus on TKE HS curl green tband 2 sets 10 LAQ yellow tband 2 sets 10- support to not engage hip flexor TKE red tband 2 sets 10 PROM to increased TKE SAQ with PTA to facilitate quad 2 sets 10  09/14/22 Nustep L 5 TUG and STS as not captured during eval but goals set 5 x STS 12.5 sec TUG with RW 14 sec Green tband HS curl seated 2  sets 10 Act flex 101 Pass 111 LAQ 3 sets 5 with PTA blocking hip flexor to prevent compensation TKE green tband 2 sets 10 Seated AROM   8  Pass 20 Fitter leg press 2 blue 2 sets 10 4inch step up with RW and PTA assist to facilitate knee ext 2 sets 10 Toe raises and calf stretching VASO medium with elevation for swelling 12 min psot ex and stretch  09/09/22 Education HEP review-QS, HS, SAQ, Hip IR, SLR, SLR with hip ER, seated long kicks, seated knee flexion VASO x 10 min for L knee inflammation and pain   PATIENT EDUCATION:  Education details: POC, HEP Person educated: Patient Education method: Explanation Education comprehension: verbalized understanding  HOME EXERCISE PROGRAM: [id number]  ASSESSMENT:  CLINICAL IMPRESSION: Added distance to the walk, he saw the surgeon today, was very pleased, he does have a little more redness today around the knee, some warmth.  He tends to turn the foot out and with walking does not get full extension, has a little bend in the knee, tried to have him work on this with heel to toe gait and focus on the toe going forward and not turning out OBJECTIVE IMPAIRMENTS: Abnormal gait, decreased activity tolerance, decreased balance, decreased coordination, difficulty walking, decreased ROM, decreased strength, impaired flexibility, and pain.   ACTIVITY LIMITATIONS: carrying, lifting, bending, sitting, standing, squatting, sleeping, stairs, and locomotion level  PARTICIPATION LIMITATIONS: meal prep, cleaning, laundry, driving, shopping, and community activity  PERSONAL FACTORS: Past/current experiences are also affecting patient's functional outcome.   REHAB POTENTIAL: Good  CLINICAL DECISION MAKING: Evolving/moderate complexity  EVALUATION COMPLEXITY: Moderate   GOALS: Goals reviewed with patient? Yes  SHORT TERM GOALS: Target date: 08/23/22 I with initial HEP Baseline: Goal status: 09/14/22 met  2.  Increase R knee ROM to 5-100 Baseline:   Goal status:  progressing 09/30/22  3.  Increase R knee strength to 3+/5 Baseline:  Goal status: met 10/05/22  LONG TERM GOALS: Target date: 12/02/22  I with final HEP Baseline:  Goal status: INITIAL  2.  Increase L knee strength to 5/5 Baseline:  Goal status: progressing 10/05/22  3.  Increase R knee ROM to 0-120 Baseline:  Goal status: INITIAL  4.  Patient will walk x at least 500' with LRAD, MI Baseline:  Goal status: progressing 09/30/22  5.  Complete 5 x STS in < 12 seconds with equal WB through BLE Baseline:  Goal status: 09/14/22 progressing  6.  Complete TUG in < 12 sec with LRAD, MI Baseline:  Goal status: 09/14/22 progressing   PLAN:  PT FREQUENCY: 2x/week  PT DURATION: 12 weeks  PLANNED INTERVENTIONS: Therapeutic exercises, Therapeutic activity, Neuromuscular re-education, Balance training, Gait training, Patient/Family education, Self Care, Joint mobilization, Cryotherapy, Moist heat, scar mobilization, Taping, Vasopneumatic device, Ionotophoresis 4mg /ml Dexamethasone, and Manual therapy  PLAN FOR NEXT SESSION: continue to push his ROM try to get his extension back.  Jearld Lesch, PT 10/05/2022, 1:57 PM  [facility] [address] [facility]. [place], [place], [address] Phone: [phone]   Fax:  [phone]

## 2022-10-05 NOTE — Progress Notes (Signed)
Post-Op Visit Note   Patient: Joshua Ruiz           Date of Birth: 1940/06/26           MRN: [id number] Visit Date: 10/05/2022 PCP: Loyola Mast, MD   Assessment & Plan:  Chief Complaint:  Chief Complaint  Patient presents with   Left Knee - Routine Post Op   Visit Diagnoses:  1. Status post total left knee replacement     Plan: Patient is a pleasant 82 year old gentleman who comes in today 6 weeks status post left total knee replacement 08/23/2022.  He has been doing well.  He has minimal discomfort which is relieved with Tylenol.  He has been compliant taking a baby aspirin twice daily for DVT prophylaxis.  He has been in outpatient physical therapy and is ambulating with a single-point cane.  Examination of the left knee reveals a fully healed surgical scar.  Range of motion from approximately 5 to 100 degrees.  He is stable to valgus and varus stress.  Calves are soft and nontender.  He is neurovascularly intact distally.  At this point, he will continue with physical therapy.  Continue to advance with activity as tolerated.  Dental prophylaxis reinforced.  Follow-up in 6 weeks for recheck.  Call with concerns or questions.  Follow-Up Instructions: Return in about 6 weeks (around 11/16/2022).   Orders:  Orders Placed This Encounter  Procedures   XR Knee 1-2 Views Left   No orders of the defined types were placed in this encounter.   Imaging: XR Knee 1-2 Views Left  Result Date: 10/05/2022 Well-seated prosthesis without complication   PMFS History: Patient Active Problem List   Diagnosis Date Noted   Numbness and tingling of fingertips 09/29/2022   Urinary retention 08/31/2022   Status post total left knee replacement 08/23/2022   Chronic rhinitis 02/15/2022   Eczema 02/15/2022   Spondylolisthesis of lumbar region 11/20/2021   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/2021   Lumbar spondylosis 10/20/2021   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-L5 level 10/20/2021    Primary osteoarthritis of left knee 08/14/2021   Chronic epididymitis 08/13/2020   Hives 07/11/2020   Rheumatoid arthritis (HCC) 07/11/2020   Cervical arthritis 07/11/2020   Gastroesophageal reflux disease 07/11/2020   Presbyopia of both eyes 04/20/2015   Pseudophakia of both eyes 04/20/2015   Past Medical History:  Diagnosis Date   Cancer (HCC)    Skin cancer left ear and nose   Complication of anesthesia    Sinus Infection after back surgery 11/2021   GERD (gastroesophageal reflux disease)    Rheumatoid arthritis (HCC)     Family History  Problem Relation Age of Onset   Rheum arthritis Mother    Diabetes Mother    Cancer Sister        Lung   Cancer Brother        Lung    Past Surgical History:  Procedure Laterality Date   COLONOSCOPY  2021   EYE SURGERY Bilateral    cataract extraction   HEMORRHOID SURGERY     LAMINECTOMY WITH POSTERIOR LATERAL ARTHRODESIS LEVEL 3 N/A 11/20/2021   Procedure: LUMBAR TWO THROUGH LUMBAR THREE, LUMBAR THREE THROUGH LUMBAR FOUR, LUMBAR FOUR THROUGH LUMBAR FIVE LAMINECTOMY WITH FACETECTOMY, POSTERIOR SEGMENTAL INSTRUMENTATION, POSTEROLATERAL ARTHRODESIS;  Surgeon: Lisbeth Renshaw, MD;  Location: [facility] OR;  Service: Neurosurgery;  Laterality: N/A;  3C   LYMPH NODE BIOPSY     Neck   MOHS SURGERY Left 2016   Ear. [facility]  [facility]erm[facility] in [place]   TOTAL KNEE ARTHROPLASTY Left 08/23/2022   Procedure: LEFT TOTAL KNEE REPLACEMENT;  Surgeon: Tarry Kos, MD;  Location: [facility] OR;  Service: Orthopedics;  Laterality: Left;   TRIGGER FINGER RELEASE Right    3rd   Social History   Occupational History   Not on file  Tobacco Use   Smoking status: Never    Passive exposure: Never   Smokeless tobacco: Never  Vaping Use   Vaping status: Never Used  Substance and Sexual Activity   Alcohol use: Not Currently    Comment: none   Drug use: Never   Sexual activity: Yes

## 2022-10-07 ENCOUNTER — Ambulatory Visit: Payer: Medicare PPO | Admitting: Physical Therapy

## 2022-10-07 DIAGNOSIS — H02831 Dermatochalasis of right upper eyelid: Secondary | ICD-10-CM | POA: Diagnosis not present

## 2022-10-07 DIAGNOSIS — H353131 Nonexudative age-related macular degeneration, bilateral, early dry stage: Secondary | ICD-10-CM | POA: Diagnosis not present

## 2022-10-07 DIAGNOSIS — H43813 Vitreous degeneration, bilateral: Secondary | ICD-10-CM | POA: Diagnosis not present

## 2022-10-07 DIAGNOSIS — M0689 Other specified rheumatoid arthritis, multiple sites: Secondary | ICD-10-CM | POA: Diagnosis not present

## 2022-10-07 DIAGNOSIS — H02834 Dermatochalasis of left upper eyelid: Secondary | ICD-10-CM | POA: Diagnosis not present

## 2022-10-07 DIAGNOSIS — H52203 Unspecified astigmatism, bilateral: Secondary | ICD-10-CM | POA: Diagnosis not present

## 2022-10-07 DIAGNOSIS — Z79899 Other long term (current) drug therapy: Secondary | ICD-10-CM | POA: Diagnosis not present

## 2022-10-07 DIAGNOSIS — H26493 Other secondary cataract, bilateral: Secondary | ICD-10-CM | POA: Diagnosis not present

## 2022-10-07 DIAGNOSIS — Z961 Presence of intraocular lens: Secondary | ICD-10-CM | POA: Diagnosis not present

## 2022-10-08 ENCOUNTER — Encounter: Payer: Self-pay | Admitting: Physical Therapy

## 2022-10-08 ENCOUNTER — Ambulatory Visit: Payer: Medicare PPO | Admitting: Physical Therapy

## 2022-10-08 DIAGNOSIS — M6281 Muscle weakness (generalized): Secondary | ICD-10-CM | POA: Diagnosis not present

## 2022-10-08 DIAGNOSIS — Z96652 Presence of left artificial knee joint: Secondary | ICD-10-CM

## 2022-10-08 DIAGNOSIS — R600 Localized edema: Secondary | ICD-10-CM | POA: Diagnosis not present

## 2022-10-08 DIAGNOSIS — R262 Difficulty in walking, not elsewhere classified: Secondary | ICD-10-CM

## 2022-10-08 NOTE — Therapy (Signed)
OUTPATIENT PHYSICAL THERAPY LOWER EXTREMITY    Patient Name: Joshua Ruiz MRN: [id number] DOB:18-Apr-1940, 82 y.o., male Today's Date: 10/08/2022  END OF SESSION:  PT End of Session - 10/08/22 1019     Visit Number 9    Date for PT Re-Evaluation 12/05/22    Authorization Type [employer]    PT Start Time 1015    PT Stop Time 1100    PT Time Calculation (min) 45 min    Activity Tolerance Patient tolerated treatment well    Behavior During Therapy WFL for tasks assessed/performed             Past Medical History:  Diagnosis Date   Cancer (HCC)    Skin cancer left ear and nose   Complication of anesthesia    Sinus Infection after back surgery 11/2021   GERD (gastroesophageal reflux disease)    Rheumatoid arthritis (HCC)    Past Surgical History:  Procedure Laterality Date   COLONOSCOPY  2021   EYE SURGERY Bilateral    cataract extraction   HEMORRHOID SURGERY     LAMINECTOMY WITH POSTERIOR LATERAL ARTHRODESIS LEVEL 3 N/A 11/20/2021   Procedure: LUMBAR TWO THROUGH LUMBAR THREE, LUMBAR THREE THROUGH LUMBAR FOUR, LUMBAR FOUR THROUGH LUMBAR FIVE LAMINECTOMY WITH FACETECTOMY, POSTERIOR SEGMENTAL INSTRUMENTATION, POSTEROLATERAL ARTHRODESIS;  Surgeon: Lisbeth Renshaw, MD;  Location: [facility] OR;  Service: Neurosurgery;  Laterality: N/A;  3C   LYMPH NODE BIOPSY     Neck   MOHS SURGERY Left 2016   Ear. [facility] D[facility] in [place]   TOTAL KNEE ARTHROPLASTY Left 08/23/2022   Procedure: LEFT TOTAL KNEE REPLACEMENT;  Surgeon: Tarry Kos, MD;  Location: [facility] OR;  Service: Orthopedics;  Laterality: Left;   TRIGGER FINGER RELEASE Right    3rd   Patient Active Problem List   Diagnosis Date Noted   Numbness and tingling of fingertips 09/29/2022   Urinary retention 08/31/2022   Status post total left knee replacement 08/23/2022   Chronic rhinitis 02/15/2022   Eczema 02/15/2022   Spondylolisthesis of lumbar region 11/20/2021   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/2021    Lumbar spondylosis 10/20/2021   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-L5 level 10/20/2021   Primary osteoarthritis of left knee 08/14/2021   Chronic epididymitis 08/13/2020   Hives 07/11/2020   Rheumatoid arthritis (HCC) 07/11/2020   Cervical arthritis 07/11/2020   Gastroesophageal reflux disease 07/11/2020   Presbyopia of both eyes 04/20/2015   Pseudophakia of both eyes 04/20/2015    PCP: Loyola Mast, MD  REFERRING PROVIDER: Tarry Kos, MD  REFERRING DIAG:  Diagnosis  M17.12 (ICD-10-CM) - Primary osteoarthritis of left knee    THERAPY DIAG:  Difficulty in walking, not elsewhere classified  Muscle weakness (generalized)  S/P TKR (total knee replacement), left  Localized edema  Rationale for Evaluation and Treatment: Rehabilitation  ONSET DATE: 08/23/22  SUBJECTIVE:   SUBJECTIVE STATEMENT:  after sitting I am very stiff until I get moving, still warm and swollen  PERTINENT HISTORY: RA, GERD, back surgery,  LTKR 08/23/22 ER visit 08/30/22 due to urinary retention PAIN:  Are you having pain? Yes: NPRS scale: 3/10 Pain location: L knee Pain description: Muscle spasms Aggravating factors: fatigue Relieving factors: ice  PRECAUTIONS: Knee and Fall  RED FLAGS: None   WEIGHT BEARING RESTRICTIONS: Yes LLE WBAT  FALLS:  Has patient fallen in last 6 months? No  LIVING ENVIRONMENT: Lives with: lives with their family and lives alone Lives in: House/apartment Stairs: No Has following equipment at home: Dan Humphreys - 2  wheeled, shower chair, and bed side commode  OCCUPATION: Retired, works out, likes to walk  PLOF: Independent  PATIENT GOALS: Return to his walking routine, less pain, recover his strength  NEXT MD VISIT: 4 weeks  OBJECTIVE:   DIAGNOSTIC FINDINGS:  Knee X ray /12/24 FINDINGS: Two-view show total knee arthroplasty. Components appear well positioned. No unexpected finding. Air and fluid in the operative region as expected.   IMPRESSION: Good  appearance following total knee arthroplasty.  COGNITION: Overall cognitive status: Within functional limits for tasks assessed     SENSATION: WFL  EDEMA:  Mod swelling in knee and lower leg, red and warm  POSTURE: rounded shoulders, forward head, and weight shift right  PALPATION: L knee tight and TTP with mild redness and swelling.  LOWER EXTREMITY ROM:  Passive ROM Right eval Left eval Left AROM 09/30/22  Hip flexion     Hip extension     Hip abduction     Hip adduction     Hip internal rotation     Hip external rotation     Knee flexion  101 114  Knee extension  -20 12  Ankle dorsiflexion     Ankle plantarflexion     Ankle inversion     Ankle eversion      (Blank rows = not tested)  LOWER EXTREMITY MMT: RLE 5/5  MMT Right eval Left eval  Hip flexion  3+  Hip extension    Hip abduction    Hip adduction    Hip internal rotation    Hip external rotation  3/5  Knee flexion  3+  Knee extension  3-  Ankle dorsiflexion    Ankle plantarflexion    Ankle inversion    Ankle eversion     (Blank rows = not tested)  FUNCTIONAL TESTS:  5 times sit to stand: TBD Timed up and go (TUG): TBD  GAIT: Distance walked: In clinic distances Assistive device utilized: Environmental consultant - 2 wheeled Level of assistance: Modified independence Comments: Step to gait pattern, slow, cautious, antalgic on L.   TODAY'S TREATMENT:                                                                                                                              DATE:  10/07/22 Nustep level 5 x 5 mintues Bike level 4 x 5 minutes Left only leg curls 15# 3x10, stretch into extension b/n sets 5# leg extension left only cues for TKE Tmill push fwd only Calf stretches On airex cone toe touches with and without cane Stairs step over step Fast walk with light HHA trying to get him to take bigger steps  On airex ball toss Asked him to do heel prop extension stretch over the weekend  10/05/22 Bike  level 4 x 5 minutes Gait around the back building no device, slow, decreased knee extension, 1 rest brace Resisted gait all directions 40# leg curls left only 15# 3x10 with passive  stretch b/n sets 5# leg extension left only small ROM focus on TKE Leg press 40# both legs x10, left only 20# 2x8 Passive stretch flexion and extension Vaso medium pressure 36 degrees  09/30/22 Nustep level 5 x 6 minutes Bike level 3 x 5 minutes Gait outside with a SPC around the parking island Leg curls 25# 2x10, 15# left only 2 x10 Leg extension 5# 2x10 Leg press 20# 2x10, then left only x10 then no weight x10 Passive stretch flexion and extension, scar mobilization Vaso medium pressure in elevation left knee 35 degrees F  09/28/22 Bike L 3 full revolutions Nustep L 5 5 min LE only HS curls both legs 25# 2x10 LLE only 10x 20# 5# LLE knee ext 3 sets 5 Black bar DF and PF 15 x each 30# resisted gait backward 10 x for TKE Amb without AD working on NOT ER left foot and increased cadeance TKE long sitting with green tband 2 sets 10 PROM flex and ext with emphasis on ext SL leg press 20# focus on TKE 2 sets 10- seat #9 weakness with push off Fitter ext 2 sets 10    09/23/22 Nustep level 5 x 6 minutes Bike partial revs x 4 minutes HS curls both legs 25# 2x10 Leg press 20# 3x10 some cues to focus on the left TKE, left leg only no weight x 10 LAQ 3# 2x10 cues to get leg straight Gait SPC with CGA around the parking [employer] 2# cues for TKE left 2x10 Passive stretch into extension Vaso in elevation high pressure 36 degrees F  09/21/22 Nustep L 5 HS curl green tband 2 sets 10 LAQ 3# 2 sets 10 TKE standing green tband 2 sets 10 Black bar toe raises and heel stretch 2 sets 10 6 in step up 2 sets 10 focus on TKE PROM flex and ext with belt mobs for ext Amb with SPC 100 feet 2 x, good stride of step through , good balance but missing heel strike on left and decreased ext VASO 10 min at end  of session for inflammation  09/16/22 Nustep L 5 focus on TKE HS curl green tband 2 sets 10 LAQ yellow tband 2 sets 10- support to not engage hip flexor TKE red tband 2 sets 10 PROM to increased TKE SAQ with PTA to facilitate quad 2 sets 10  09/14/22 Nustep L 5 TUG and STS as not captured during eval but goals set 5 x STS 12.5 sec TUG with RW 14 sec Green tband HS curl seated 2 sets 10 Act flex 101 Pass 111 LAQ 3 sets 5 with PTA blocking hip flexor to prevent compensation TKE green tband 2 sets 10 Seated AROM   8  Pass 20 Fitter leg press 2 blue 2 sets 10 4inch step up with RW and PTA assist to facilitate knee ext 2 sets 10 Toe raises and calf stretching VASO medium with elevation for swelling 12 min psot ex and stretch  09/09/22 Education HEP review-QS, HS, SAQ, Hip IR, SLR, SLR with hip ER, seated long kicks, seated knee flexion VASO x 10 min for L knee inflammation and pain   PATIENT EDUCATION:  Education details: POC, HEP Person educated: Patient Education method: Explanation Education comprehension: verbalized understanding  HOME EXERCISE PROGRAM: [id number]  ASSESSMENT:  CLINICAL IMPRESSION: Worked a little more on him getting extension and asked him to assure toe pointed forward and to get heel strike.  Still very stiff and very tight  and stiff when he starts to walk OBJECTIVE IMPAIRMENTS: Abnormal gait, decreased activity tolerance, decreased balance, decreased coordination, difficulty walking, decreased ROM, decreased strength, impaired flexibility, and pain.   ACTIVITY LIMITATIONS: carrying, lifting, bending, sitting, standing, squatting, sleeping, stairs, and locomotion level  PARTICIPATION LIMITATIONS: meal prep, cleaning, laundry, driving, shopping, and community activity  PERSONAL FACTORS: Past/current experiences are also affecting patient's functional outcome.   REHAB POTENTIAL: Good  CLINICAL DECISION MAKING: Evolving/moderate  complexity  EVALUATION COMPLEXITY: Moderate   GOALS: Goals reviewed with patient? Yes  SHORT TERM GOALS: Target date: 08/23/22 I with initial HEP Baseline: Goal status: 09/14/22 met  2.  Increase R knee ROM to 5-100 Baseline:  Goal status:  progressing 09/30/22  3.  Increase R knee strength to 3+/5 Baseline:  Goal status: met 10/05/22  LONG TERM GOALS: Target date: 12/02/22  I with final HEP Baseline:  Goal status: INITIAL  2.  Increase L knee strength to 5/5 Baseline:  Goal status: progressing 10/05/22  3.  Increase R knee ROM to 0-120 Baseline:  Goal status: INITIAL  4.  Patient will walk x at least 500' with LRAD, MI Baseline:  Goal status: progressing 09/30/22  5.  Complete 5 x STS in < 12 seconds with equal WB through BLE Baseline:  Goal status: 09/14/22 progressing  6.  Complete TUG in < 12 sec with LRAD, MI Baseline:  Goal status: 09/14/22 progressing   PLAN:  PT FREQUENCY: 2x/week  PT DURATION: 12 weeks  PLANNED INTERVENTIONS: Therapeutic exercises, Therapeutic activity, Neuromuscular re-education, Balance training, Gait training, Patient/Family education, Self Care, Joint mobilization, Cryotherapy, Moist heat, scar mobilization, Taping, Vasopneumatic device, Ionotophoresis 4mg /ml Dexamethasone, and Manual therapy  PLAN FOR NEXT SESSION: continue to push his ROM try to get his extension back.  Jearld Lesch, PT 10/08/2022, 10:20 AM  [facility] at [facility] W[address] [facility]. [place], [place], [address] Phone: [phone]   Fax:  [phone]

## 2022-10-13 ENCOUNTER — Ambulatory Visit: Payer: Medicare PPO | Attending: Orthopaedic Surgery

## 2022-10-13 DIAGNOSIS — Z96652 Presence of left artificial knee joint: Secondary | ICD-10-CM | POA: Diagnosis not present

## 2022-10-13 DIAGNOSIS — M6281 Muscle weakness (generalized): Secondary | ICD-10-CM | POA: Insufficient documentation

## 2022-10-13 DIAGNOSIS — R262 Difficulty in walking, not elsewhere classified: Secondary | ICD-10-CM | POA: Insufficient documentation

## 2022-10-13 DIAGNOSIS — R600 Localized edema: Secondary | ICD-10-CM | POA: Insufficient documentation

## 2022-10-13 NOTE — Therapy (Signed)
OUTPATIENT PHYSICAL THERAPY LOWER EXTREMITY   Progress Note Reporting Period 09/09/22 to 10/13/22  See note below for Objective Data and Assessment of Progress/Goals.     Patient Name: Joshua Ruiz MRN: [id number] DOB:1940/12/05, 82 y.o., male Today's Date: 10/13/2022  END OF SESSION:  PT End of Session - 10/13/22 1441     Visit Number 10    Date for PT Re-Evaluation 12/05/22    Authorization Type [employer]    PT Start Time 1445    PT Stop Time 1530    PT Time Calculation (min) 45 min    Activity Tolerance Patient tolerated treatment well    Behavior During Therapy [facility] for tasks assessed/performed              Past Medical History:  Diagnosis Date   Cancer (HCC)    Skin cancer left ear and nose   Complication of anesthesia    Sinus Infection after back surgery 11/2021   GERD (gastroesophageal reflux disease)    Rheumatoid arthritis (HCC)    Past Surgical History:  Procedure Laterality Date   COLONOSCOPY  2021   EYE SURGERY Bilateral    cataract extraction   HEMORRHOID SURGERY     LAMINECTOMY WITH POSTERIOR LATERAL ARTHRODESIS LEVEL 3 N/A 11/20/2021   Procedure: LUMBAR TWO THROUGH LUMBAR THREE, LUMBAR THREE THROUGH LUMBAR FOUR, LUMBAR FOUR THROUGH LUMBAR FIVE LAMINECTOMY WITH FACETECTOMY, POSTERIOR SEGMENTAL INSTRUMENTATION, POSTEROLATERAL ARTHRODESIS;  Surgeon: Lisbeth Renshaw, MD;  Location: [facility] OR;  Service: Neurosurgery;  Laterality: N/A;  3C   LYMPH NODE BIOPSY     Neck   MOHS SURGERY Left 2016   Ear. [facility] in [place]   TOTAL KNEE ARTHROPLASTY Left 08/23/2022   Procedure: LEFT TOTAL KNEE REPLACEMENT;  Surgeon: Tarry Kos, MD;  Location: [facility] OR;  Service: Orthopedics;  Laterality: Left;   TRIGGER FINGER RELEASE Right    3rd   Patient Active Problem List   Diagnosis Date Noted   Numbness and tingling of fingertips 09/29/2022   Urinary retention 08/31/2022   Status post total left knee replacement 08/23/2022   Chronic rhinitis 02/15/2022    Eczema 02/15/2022   Spondylolisthesis of lumbar region 11/20/2021   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/2021   Lumbar spondylosis 10/20/2021   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-L5 level 10/20/2021   Primary osteoarthritis of left knee 08/14/2021   Chronic epididymitis 08/13/2020   Hives 07/11/2020   Rheumatoid arthritis (HCC) 07/11/2020   Cervical arthritis 07/11/2020   Gastroesophageal reflux disease 07/11/2020   Presbyopia of both eyes 04/20/2015   Pseudophakia of both eyes 04/20/2015    PCP: Loyola Mast, MD  REFERRING PROVIDER: Tarry Kos, MD  REFERRING DIAG:  Diagnosis  M17.12 (ICD-10-CM) - Primary osteoarthritis of left knee    THERAPY DIAG:  S/P TKR (total knee replacement), left  Muscle weakness (generalized)  Difficulty in walking, not elsewhere classified  Localized edema  Rationale for Evaluation and Treatment: Rehabilitation  ONSET DATE: 08/23/22  SUBJECTIVE:   SUBJECTIVE STATEMENT:  today is not a good day, I am having issues with urination again. If I have to go I need to go fast. Am trying to contact the urologist against.   PERTINENT HISTORY: RA, GERD, back surgery,  LTKR 08/23/22 ER visit 08/30/22 due to urinary retention PAIN:  Are you having pain? Yes: NPRS scale: 2/10 Pain location: L knee Pain description: Muscle spasms Aggravating factors: fatigue Relieving factors: ice  PRECAUTIONS: Knee and Fall  RED FLAGS: None   WEIGHT BEARING RESTRICTIONS:  Yes LLE WBAT  FALLS:  Has patient fallen in last 6 months? No  LIVING ENVIRONMENT: Lives with: lives with their family and lives alone Lives in: House/apartment Stairs: No Has following equipment at home: Dan Humphreys - 2 wheeled, shower chair, and bed side commode  OCCUPATION: Retired, works out, likes to walk  PLOF: Independent  PATIENT GOALS: Return to his walking routine, less pain, recover his strength  NEXT MD VISIT: 4 weeks  OBJECTIVE:   DIAGNOSTIC FINDINGS:  Knee  X ray /12/24 FINDINGS: Two-view show total knee arthroplasty. Components appear well positioned. No unexpected finding. Air and fluid in the operative region as expected.   IMPRESSION: Good appearance following total knee arthroplasty.  COGNITION: Overall cognitive status: Within functional limits for tasks assessed     SENSATION: WFL  EDEMA:  Mod swelling in knee and lower leg, red and warm  POSTURE: rounded shoulders, forward head, and weight shift right  PALPATION: L knee tight and TTP with mild redness and swelling.  LOWER EXTREMITY ROM:  Passive ROM Right eval Left eval Left AROM 09/30/22 L AROM 10/13/22  Hip flexion      Hip extension      Hip abduction      Hip adduction      Hip internal rotation      Hip external rotation      Knee flexion  101 114 110  Knee extension  -20 12 8   Ankle dorsiflexion      Ankle plantarflexion      Ankle inversion      Ankle eversion       (Blank rows = not tested)  LOWER EXTREMITY MMT: RLE 5/5  MMT Right eval Left eval Left  10/13/22  Hip flexion  3+ 5  Hip extension     Hip abduction     Hip adduction     Hip internal rotation     Hip external rotation  3/5   Knee flexion  3+ 5  Knee extension  3- 5  Ankle dorsiflexion     Ankle plantarflexion     Ankle inversion     Ankle eversion      (Blank rows = not tested)  FUNCTIONAL TESTS:  5 times sit to stand: TBD Timed up and go (TUG): TBD  GAIT: Distance walked: In clinic distances Assistive device utilized: Environmental consultant - 2 wheeled Level of assistance: Modified independence Comments: Step to gait pattern, slow, cautious, antalgic on L.   TODAY'S TREATMENT:                                                                                                                              DATE:  10/13/22 Progress note, recheck goals  NuStep L5 x20mins  Bike L3 x46mins  Calf stretch 30s Calf raises 2x10 Leg ext 2x10 10#, eccentric lowering L x10, 5# LLE only x10 HS  curls 25# 2x10, 15# LLE x10 PROM flex/ext, patellar mobs STS holding yellow  ball  2x10    10/07/22 Nustep level 5 x 5 mintues Bike level 4 x 5 minutes Left only leg curls 15# 3x10, stretch into extension b/n sets 5# leg extension left only cues for TKE Tmill push fwd only Calf stretches On airex cone toe touches with and without cane Stairs step over step Fast walk with light HHA trying to get him to take bigger steps  On airex ball toss Asked him to do heel prop extension stretch over the weekend  10/05/22 Bike level 4 x 5 minutes Gait around the back building no device, slow, decreased knee extension, 1 rest brace Resisted gait all directions 40# leg curls left only 15# 3x10 with passive stretch b/n sets 5# leg extension left only small ROM focus on TKE Leg press 40# both legs x10, left only 20# 2x8 Passive stretch flexion and extension Vaso medium pressure 36 degrees  09/30/22 Nustep level 5 x 6 minutes Bike level 3 x 5 minutes Gait outside with a SPC around the parking island Leg curls 25# 2x10, 15# left only 2 x10 Leg extension 5# 2x10 Leg press 20# 2x10, then left only x10 then no weight x10 Passive stretch flexion and extension, scar mobilization Vaso medium pressure in elevation left knee 35 degrees F  09/28/22 Bike L 3 full revolutions Nustep L 5 5 min LE only HS curls both legs 25# 2x10 LLE only 10x 20# 5# LLE knee ext 3 sets 5 Black bar DF and PF 15 x each 30# resisted gait backward 10 x for TKE Amb without AD working on NOT ER left foot and increased cadeance TKE long sitting with green tband 2 sets 10 PROM flex and ext with emphasis on ext SL leg press 20# focus on TKE 2 sets 10- seat #9 weakness with push off Fitter ext 2 sets 10    09/23/22 Nustep level 5 x 6 minutes Bike partial revs x 4 minutes HS curls both legs 25# 2x10 Leg press 20# 3x10 some cues to focus on the left TKE, left leg only no weight x 10 LAQ 3# 2x10 cues to get leg  straight Gait SPC with CGA around the parking [employer] 2# cues for TKE left 2x10 Passive stretch into extension Vaso in elevation high pressure 36 degrees F  09/21/22 Nustep L 5 HS curl green tband 2 sets 10 LAQ 3# 2 sets 10 TKE standing green tband 2 sets 10 Black bar toe raises and heel stretch 2 sets 10 6 in step up 2 sets 10 focus on TKE PROM flex and ext with belt mobs for ext Amb with SPC 100 feet 2 x, good stride of step through , good balance but missing heel strike on left and decreased ext VASO 10 min at end of session for inflammation  09/16/22 Nustep L 5 focus on TKE HS curl green tband 2 sets 10 LAQ yellow tband 2 sets 10- support to not engage hip flexor TKE red tband 2 sets 10 PROM to increased TKE SAQ with PTA to facilitate quad 2 sets 10  09/14/22 Nustep L 5 TUG and STS as not captured during eval but goals set 5 x STS 12.5 sec TUG with RW 14 sec Green tband HS curl seated 2 sets 10 Act flex 101 Pass 111 LAQ 3 sets 5 with PTA blocking hip flexor to prevent compensation TKE green tband 2 sets 10 Seated AROM   8  Pass 20 Fitter leg press 2 blue 2  sets 10 4inch step up with RW and PTA assist to facilitate knee ext 2 sets 10 Toe raises and calf stretching VASO medium with elevation for swelling 12 min psot ex and stretch  09/09/22 Education HEP review-QS, HS, SAQ, Hip IR, SLR, SLR with hip ER, seated long kicks, seated knee flexion VASO x 10 min for L knee inflammation and pain   PATIENT EDUCATION:  Education details: POC, HEP Person educated: Patient Education method: Explanation Education comprehension: verbalized understanding  HOME EXERCISE PROGRAM: [id number]  ASSESSMENT:  CLINICAL IMPRESSION: Progress note complete. He has met most of his goals. Still working on ROM and gait goal. Reports getting it straight is the hardest thing right now. Able to get 120d flexion passively.  Still very stiff and tight when he starts to  walk.  OBJECTIVE IMPAIRMENTS: Abnormal gait, decreased activity tolerance, decreased balance, decreased coordination, difficulty walking, decreased ROM, decreased strength, impaired flexibility, and pain.   ACTIVITY LIMITATIONS: carrying, lifting, bending, sitting, standing, squatting, sleeping, stairs, and locomotion level  PARTICIPATION LIMITATIONS: meal prep, cleaning, laundry, driving, shopping, and community activity  PERSONAL FACTORS: Past/current experiences are also affecting patient's functional outcome.   REHAB POTENTIAL: Good  CLINICAL DECISION MAKING: Evolving/moderate complexity  EVALUATION COMPLEXITY: Moderate   GOALS: Goals reviewed with patient? Yes  SHORT TERM GOALS: Target date: 08/23/22 I with initial HEP Baseline: Goal status: 09/14/22 met  2.  Increase R knee ROM to 5-100 Baseline:  Goal status:  progressing 10/13/22  3.  Increase R knee strength to 3+/5 Baseline:  Goal status: met 10/05/22  LONG TERM GOALS: Target date: 12/02/22  I with final HEP Baseline:  Goal status: INITIAL  2.  Increase L knee strength to 5/5 Baseline:  Goal status: MET 10/13/22  3.  Increase L knee ROM to 0-120 Baseline:  Goal status: progressing 10/13/22  4.  Patient will walk x at least 500' with LRAD, MI Baseline:  Goal status: progressing 09/30/22  5.  Complete 5 x STS in < 12 seconds with equal WB through BLE Baseline:  Goal status: 09/14/22 progressing, 11.67s MET 10/13/22  6.  Complete TUG in < 12 sec with LRAD, MI Baseline:  Goal status: 09/14/22 progressing, 9.25s MET 10/13/22   PLAN:  PT FREQUENCY: 2x/week  PT DURATION: 12 weeks  PLANNED INTERVENTIONS: Therapeutic exercises, Therapeutic activity, Neuromuscular re-education, Balance training, Gait training, Patient/Family education, Self Care, Joint mobilization, Cryotherapy, Moist heat, scar mobilization, Taping, Vasopneumatic device, Ionotophoresis 4mg /ml Dexamethasone, and Manual therapy  PLAN FOR NEXT  SESSION: continue to push his ROM try to get his extension back.   Cassie Freer, PT 10/13/2022, 3:44 PM  [facility] at [facility] [address] [facility]. [place], [place], [address] Phone: [phone]   Fax:  [phone]

## 2022-10-14 ENCOUNTER — Ambulatory Visit: Payer: Medicare PPO | Admitting: Physical Therapy

## 2022-10-14 DIAGNOSIS — N3000 Acute cystitis without hematuria: Secondary | ICD-10-CM | POA: Diagnosis not present

## 2022-10-14 DIAGNOSIS — R8271 Bacteriuria: Secondary | ICD-10-CM | POA: Diagnosis not present

## 2022-10-18 DIAGNOSIS — N1330 Unspecified hydronephrosis: Secondary | ICD-10-CM | POA: Diagnosis not present

## 2022-10-19 ENCOUNTER — Ambulatory Visit: Payer: Medicare PPO | Admitting: Physical Therapy

## 2022-10-19 ENCOUNTER — Encounter: Payer: Self-pay | Admitting: Physical Therapy

## 2022-10-19 DIAGNOSIS — R262 Difficulty in walking, not elsewhere classified: Secondary | ICD-10-CM | POA: Diagnosis not present

## 2022-10-19 DIAGNOSIS — M6281 Muscle weakness (generalized): Secondary | ICD-10-CM

## 2022-10-19 DIAGNOSIS — R600 Localized edema: Secondary | ICD-10-CM

## 2022-10-19 DIAGNOSIS — Z96652 Presence of left artificial knee joint: Secondary | ICD-10-CM | POA: Diagnosis not present

## 2022-10-19 NOTE — Therapy (Signed)
OUTPATIENT PHYSICAL THERAPY LOWER EXTREMITY    Patient Name: Joshua Ruiz MRN: [id number] DOB:[birth year], 82 y.o., male Today's Date: 10/19/2022  END OF SESSION:  PT End of Session - 10/19/22 1435     Visit Number 11    Number of Visits 24    Date for PT Re-Evaluation 12/05/22    Authorization Type [employer]    PT Start Time 1435    PT Stop Time 1525    PT Time Calculation (min) 50 min    Activity Tolerance Patient tolerated treatment well    Behavior During Therapy WFL for tasks assessed/performed              Past Medical History:  Diagnosis Date   Cancer (HCC)    Skin cancer left ear and nose   Complication of anesthesia    Sinus Infection after back surgery 11/2021   GERD (gastroesophageal reflux disease)    Rheumatoid arthritis (HCC)    Past Surgical History:  Procedure Laterality Date   COLONOSCOPY  2021   EYE SURGERY Bilateral    cataract extraction   HEMORRHOID SURGERY     LAMINECTOMY WITH POSTERIOR LATERAL ARTHRODESIS LEVEL 3 N/A 11/20/2021   Procedure: LUMBAR TWO THROUGH LUMBAR THREE, LUMBAR THREE THROUGH LUMBAR FOUR, LUMBAR FOUR THROUGH LUMBAR FIVE LAMINECTOMY WITH FACETECTOMY, POSTERIOR SEGMENTAL INSTRUMENTATION, POSTEROLATERAL ARTHRODESIS;  Surgeon: Lisbeth Renshaw, MD;  Location: [facility] OR;  Service: Neurosurgery;  Laterality: N/A;  3C   LYMPH NODE BIOPSY     Neck   MOHS SURGERY Left 2016   Ear. [facility] [place]   TOTAL KNEE ARTHROPLASTY Left 08/23/2022   Procedure: LEFT TOTAL KNEE REPLACEMENT;  Surgeon: Tarry Kos, MD;  Location: [facility] OR;  Service: Orthopedics;  Laterality: Left;   TRIGGER FINGER RELEASE Right    3rd   Patient Active Problem List   Diagnosis Date Noted   Numbness and tingling of fingertips 09/29/2022   Urinary retention 08/31/2022   Status post total left knee replacement 08/23/2022   Chronic rhinitis 02/15/2022   Eczema 02/15/2022   Spondylolisthesis of lumbar region 11/20/2021   Lumbar stenosis with  neurogenic claudication 10/20/2021   Lumbar spondylosis 10/20/2021   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-L5 level 10/20/2021   Primary osteoarthritis of left knee 08/14/2021   Chronic epididymitis 08/13/2020   Hives 07/11/2020   Rheumatoid arthritis (HCC) 07/11/2020   Cervical arthritis 07/11/2020   Gastroesophageal reflux disease 07/11/2020   Presbyopia of both eyes 04/20/2015   Pseudophakia of both eyes 04/20/2015    PCP: Loyola Mast, MD  REFERRING PROVIDER: Tarry Kos, MD  REFERRING DIAG:  Diagnosis  M17.12 (ICD-10-CM) - Primary osteoarthritis of left knee    THERAPY DIAG:  S/P TKR (total knee replacement), left  Muscle weakness (generalized)  Difficulty in walking, not elsewhere classified  Localized edema  Rationale for Evaluation and Treatment: Rehabilitation  ONSET DATE: 08/23/22  SUBJECTIVE:   SUBJECTIVE STATEMENT:  I am feeling better than last week, I was not feeling good, the medicine made me feel dizzy and had a headache, almost passed out twice.  "I stopped taking it" PERTINENT HISTORY: RA, GERD, back surgery,  LTKR 08/23/22 ER visit 08/30/22 due to urinary retention PAIN:  Are you having pain? Yes: NPRS scale: 2/10 Pain location: L knee Pain description: Muscle spasms Aggravating factors: fatigue Relieving factors: ice  PRECAUTIONS: Knee and Fall  RED FLAGS: None   WEIGHT BEARING RESTRICTIONS: Yes LLE WBAT  FALLS:  Has patient fallen in last 6 months? No  LIVING ENVIRONMENT: Lives with: lives with their family and lives alone Lives in: House/apartment Stairs: No Has following equipment at home: Dan Humphreys - 2 wheeled, shower chair, and bed side commode  OCCUPATION: Retired, works out, likes to walk  PLOF: Independent  PATIENT GOALS: Return to his walking routine, less pain, recover his strength  NEXT MD VISIT: 4 weeks  OBJECTIVE:   DIAGNOSTIC FINDINGS:  Knee X ray /12/24 FINDINGS: Two-view show total knee arthroplasty. Components appear  well positioned. No unexpected finding. Air and fluid in the operative region as expected.   IMPRESSION: Good appearance following total knee arthroplasty.  COGNITION: Overall cognitive status: Within functional limits for tasks assessed     SENSATION: WFL  EDEMA:  Mod swelling in knee and lower leg, red and warm  POSTURE: rounded shoulders, forward head, and weight shift right  PALPATION: L knee tight and TTP with mild redness and swelling.  LOWER EXTREMITY ROM:  Passive ROM Right eval Left eval Left AROM 09/30/22 L AROM 10/13/22  Hip flexion      Hip extension      Hip abduction      Hip adduction      Hip internal rotation      Hip external rotation      Knee flexion  101 114 110  Knee extension  -20 12 8   Ankle dorsiflexion      Ankle plantarflexion      Ankle inversion      Ankle eversion       (Blank rows = not tested)  LOWER EXTREMITY MMT: RLE 5/5  MMT Right eval Left eval Left  10/13/22  Hip flexion  3+ 5  Hip extension     Hip abduction     Hip adduction     Hip internal rotation     Hip external rotation  3/5   Knee flexion  3+ 5  Knee extension  3- 5  Ankle dorsiflexion     Ankle plantarflexion     Ankle inversion     Ankle eversion      (Blank rows = not tested)  FUNCTIONAL TESTS:  5 times sit to stand: TBD Timed up and go (TUG): TBD  GAIT: Distance walked: In clinic distances Assistive device utilized: Environmental consultant - 2 wheeled Level of assistance: Modified independence Comments: Step to gait pattern, slow, cautious, antalgic on L.   TODAY'S TREATMENT:                                                                                                                              DATE:  10/19/22 Nustep level 5x5 minutes LE's only Bike full revs 5 minutes level 4 Gait outside around the back building one rest break when coming up the hill Leg curls 35# 2x10 with stretch into extension b/n sets and after Leg extension 5# really focus on TKE  left eccentrics Passive knee extension stretch 3# SAQ cues for TKE  10/13/22 Progress  note, recheck goals  NuStep L5 x55mins  Bike L3 x50mins  Calf stretch 30s Calf raises 2x10 Leg ext 2x10 10#, eccentric lowering L x10, 5# LLE only x10 HS curls 25# 2x10, 15# LLE x10 PROM flex/ext, patellar mobs STS holding yellow ball  2x10    10/07/22 Nustep level 5 x 5 mintues Bike level 4 x 5 minutes Left only leg curls 15# 3x10, stretch into extension b/n sets 5# leg extension left only cues for TKE Tmill push fwd only Calf stretches On airex cone toe touches with and without cane Stairs step over step Fast walk with light HHA trying to get him to take bigger steps  On airex ball toss Asked him to do heel prop extension stretch over the weekend  10/05/22 Bike level 4 x 5 minutes Gait around the back building no device, slow, decreased knee extension, 1 rest brace Resisted gait all directions 40# leg curls left only 15# 3x10 with passive stretch b/n sets 5# leg extension left only small ROM focus on TKE Leg press 40# both legs x10, left only 20# 2x8 Passive stretch flexion and extension Vaso medium pressure 36 degrees  09/30/22 Nustep level 5 x 6 minutes Bike level 3 x 5 minutes Gait outside with a SPC around the parking island Leg curls 25# 2x10, 15# left only 2 x10 Leg extension 5# 2x10 Leg press 20# 2x10, then left only x10 then no weight x10 Passive stretch flexion and extension, scar mobilization Vaso medium pressure in elevation left knee 35 degrees F  09/28/22 Bike L 3 full revolutions Nustep L 5 5 min LE only HS curls both legs 25# 2x10 LLE only 10x 20# 5# LLE knee ext 3 sets 5 Black bar DF and PF 15 x each 30# resisted gait backward 10 x for TKE Amb without AD working on NOT ER left foot and increased cadeance TKE long sitting with green tband 2 sets 10 PROM flex and ext with emphasis on ext SL leg press 20# focus on TKE 2 sets 10- seat #9 weakness with push  off Fitter ext 2 sets 10    09/23/22 Nustep level 5 x 6 minutes Bike partial revs x 4 minutes HS curls both legs 25# 2x10 Leg press 20# 3x10 some cues to focus on the left TKE, left leg only no weight x 10 LAQ 3# 2x10 cues to get leg straight Gait SPC with CGA around the parking [employer] 2# cues for TKE left 2x10 Passive stretch into extension Vaso in elevation high pressure 36 degrees F  09/21/22 Nustep L 5 HS curl green tband 2 sets 10 LAQ 3# 2 sets 10 TKE standing green tband 2 sets 10 Black bar toe raises and heel stretch 2 sets 10 6 in step up 2 sets 10 focus on TKE PROM flex and ext with belt mobs for ext Amb with SPC 100 feet 2 x, good stride of step through , good balance but missing heel strike on left and decreased ext VASO 10 min at end of session for inflammation  09/16/22 Nustep L 5 focus on TKE HS curl green tband 2 sets 10 LAQ yellow tband 2 sets 10- support to not engage hip flexor TKE red tband 2 sets 10 PROM to increased TKE SAQ with PTA to facilitate quad 2 sets 10  09/14/22 Nustep L 5 TUG and STS as not captured during eval but goals set 5 x STS 12.5 sec TUG with RW 14 sec Green  tband HS curl seated 2 sets 10 Act flex 101 Pass 111 LAQ 3 sets 5 with PTA blocking hip flexor to prevent compensation TKE green tband 2 sets 10 Seated AROM   8  Pass 20 Fitter leg press 2 blue 2 sets 10 4inch step up with RW and PTA assist to facilitate knee ext 2 sets 10 Toe raises and calf stretching VASO medium with elevation for swelling 12 min psot ex and stretch  09/09/22 Education HEP review-QS, HS, SAQ, Hip IR, SLR, SLR with hip ER, seated long kicks, seated knee flexion VASO x 10 min for L knee inflammation and pain   PATIENT EDUCATION:  Education details: POC, HEP Person educated: Patient Education method: Explanation Education comprehension: verbalized understanding  HOME EXERCISE PROGRAM: [id number]  ASSESSMENT:  CLINICAL  IMPRESSION: Patient continues to be SOB and needs rest to make it around the building, he continues to walk with the left knee in flexion and with some ER avoiding the knee extension.  I really focused on this today  OBJECTIVE IMPAIRMENTS: Abnormal gait, decreased activity tolerance, decreased balance, decreased coordination, difficulty walking, decreased ROM, decreased strength, impaired flexibility, and pain.   ACTIVITY LIMITATIONS: carrying, lifting, bending, sitting, standing, squatting, sleeping, stairs, and locomotion level  PARTICIPATION LIMITATIONS: meal prep, cleaning, laundry, driving, shopping, and community activity  PERSONAL FACTORS: Past/current experiences are also affecting patient's functional outcome.   REHAB POTENTIAL: Good  CLINICAL DECISION MAKING: Evolving/moderate complexity  EVALUATION COMPLEXITY: Moderate   GOALS: Goals reviewed with patient? Yes  SHORT TERM GOALS: Target date: 08/23/22 I with initial HEP Baseline: Goal status: 09/14/22 met  2.  Increase R knee ROM to 5-100 Baseline:  Goal status:  progressing 10/13/22  3.  Increase R knee strength to 3+/5 Baseline:  Goal status: met 10/05/22  LONG TERM GOALS: Target date: 12/02/22  I with final HEP Baseline:  Goal status: INITIAL  2.  Increase L knee strength to 5/5 Baseline:  Goal status: MET 10/13/22  3.  Increase L knee ROM to 0-120 Baseline:  Goal status: progressing 10/13/22  4.  Patient will walk x at least 500' with LRAD, MI Baseline:  Goal status: progressing 09/30/22  5.  Complete 5 x STS in < 12 seconds with equal WB through BLE Baseline:  Goal status: 09/14/22 progressing, 11.67s MET 10/13/22  6.  Complete TUG in < 12 sec with LRAD, MI Baseline:  Goal status: 09/14/22 progressing, 9.25s MET 10/13/22   PLAN:  PT FREQUENCY: 2x/week  PT DURATION: 12 weeks  PLANNED INTERVENTIONS: Therapeutic exercises, Therapeutic activity, Neuromuscular re-education, Balance training, Gait  training, Patient/Family education, Self Care, Joint mobilization, Cryotherapy, Moist heat, scar mobilization, Taping, Vasopneumatic device, Ionotophoresis 4mg /ml Dexamethasone, and Manual therapy  PLAN FOR NEXT SESSION: continue to push his ROM try to get his extension back.   Jearld Lesch, PT 10/19/2022, 2:36 PM  [facility] [address] [facility]. [place], [place], [address] Phone: [phone]   Fax:  [phone]

## 2022-10-20 DIAGNOSIS — R3912 Poor urinary stream: Secondary | ICD-10-CM | POA: Diagnosis not present

## 2022-10-20 DIAGNOSIS — R350 Frequency of micturition: Secondary | ICD-10-CM | POA: Diagnosis not present

## 2022-10-20 DIAGNOSIS — R3914 Feeling of incomplete bladder emptying: Secondary | ICD-10-CM | POA: Diagnosis not present

## 2022-10-20 DIAGNOSIS — N401 Enlarged prostate with lower urinary tract symptoms: Secondary | ICD-10-CM | POA: Diagnosis not present

## 2022-10-20 DIAGNOSIS — R351 Nocturia: Secondary | ICD-10-CM | POA: Diagnosis not present

## 2022-10-21 ENCOUNTER — Ambulatory Visit: Payer: Medicare PPO | Admitting: Physical Therapy

## 2022-10-21 DIAGNOSIS — R600 Localized edema: Secondary | ICD-10-CM

## 2022-10-21 DIAGNOSIS — R262 Difficulty in walking, not elsewhere classified: Secondary | ICD-10-CM

## 2022-10-21 DIAGNOSIS — Z96652 Presence of left artificial knee joint: Secondary | ICD-10-CM

## 2022-10-21 DIAGNOSIS — M6281 Muscle weakness (generalized): Secondary | ICD-10-CM

## 2022-10-21 NOTE — Therapy (Signed)
OUTPATIENT PHYSICAL THERAPY LOWER EXTREMITY    Patient Name: Joshua Ruiz MRN: [id number] DOB:[birth year], 82 y.o., male Today's Date: 10/21/2022  END OF SESSION:  PT End of Session - 10/21/22 1347     Visit Number 12    Date for PT Re-Evaluation 12/05/22    Authorization Type [employer]    PT Start Time 1350    PT Stop Time 1430    PT Time Calculation (min) 40 min              Past Medical History:  Diagnosis Date   Cancer (HCC)    Skin cancer left ear and nose   Complication of anesthesia    Sinus Infection after back surgery 11/2021   GERD (gastroesophageal reflux disease)    Rheumatoid arthritis (HCC)    Past Surgical History:  Procedure Laterality Date   COLONOSCOPY  2021   EYE SURGERY Bilateral    cataract extraction   HEMORRHOID SURGERY     LAMINECTOMY WITH POSTERIOR LATERAL ARTHRODESIS LEVEL 3 N/A 11/20/2021   Procedure: LUMBAR TWO THROUGH LUMBAR THREE, LUMBAR THREE THROUGH LUMBAR FOUR, LUMBAR FOUR THROUGH LUMBAR FIVE LAMINECTOMY WITH FACETECTOMY, POSTERIOR SEGMENTAL INSTRUMENTATION, POSTEROLATERAL ARTHRODESIS;  Surgeon: Lisbeth Renshaw, MD;  Location: [facility] OR;  Service: Neurosurgery;  Laterality: N/A;  3C   LYMPH NODE BIOPSY     Neck   MOHS SURGERY Left 2016   Ear. [facility] in [place]   TOTAL KNEE ARTHROPLASTY Left 08/23/2022   Procedure: LEFT TOTAL KNEE REPLACEMENT;  Surgeon: Tarry Kos, MD;  Location: [facility] OR;  Service: Orthopedics;  Laterality: Left;   TRIGGER FINGER RELEASE Right    3rd   Patient Active Problem List   Diagnosis Date Noted   Numbness and tingling of fingertips 09/29/2022   Urinary retention 08/31/2022   Status post total left knee replacement 08/23/2022   Chronic rhinitis 02/15/2022   Eczema 02/15/2022   Spondylolisthesis of lumbar region 11/20/2021   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/2021   Lumbar spondylosis 10/20/2021   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-L5 level 10/20/2021   Primary osteoarthritis of left  knee 08/14/2021   Chronic epididymitis 08/13/2020   Hives 07/11/2020   Rheumatoid arthritis (HCC) 07/11/2020   Cervical arthritis 07/11/2020   Gastroesophageal reflux disease 07/11/2020   Presbyopia of both eyes 04/20/2015   Pseudophakia of both eyes 04/20/2015    PCP: Loyola Mast, MD  REFERRING PROVIDER: Tarry Kos, MD  REFERRING DIAG:  Diagnosis  M17.12 (ICD-10-CM) - Primary osteoarthritis of left knee    THERAPY DIAG:  S/P TKR (total knee replacement), left  Muscle weakness (generalized)  Difficulty in walking, not elsewhere classified  Localized edema  Rationale for Evaluation and Treatment: Rehabilitation  ONSET DATE: 08/23/22  SUBJECTIVE:   SUBJECTIVE STATEMENT:  feeling better. MD wanted me to try medicine again- slight HA but no dizziness.   PERTINENT HISTORY: RA, GERD, back surgery,  LTKR 08/23/22 ER visit 08/30/22 due to urinary retention PAIN:  Are you having pain? Yes: NPRS scale: 2/10 Pain location: L knee Pain description: Muscle spasms Aggravating factors: fatigue Relieving factors: ice  PRECAUTIONS: Knee and Fall  RED FLAGS: None   WEIGHT BEARING RESTRICTIONS: Yes LLE WBAT  FALLS:  Has patient fallen in last 6 months? No  LIVING ENVIRONMENT: Lives with: lives with their family and lives alone Lives in: House/apartment Stairs: No Has following equipment at home: Dan Humphreys - 2 wheeled, shower chair, and bed side commode  OCCUPATION: Retired, works out, likes to walk  PLOF:  OUTPATIENT PHYSICAL THERAPY LOWER EXTREMITY    Patient Name: Joshua Ruiz MRN: [id number] DOB:[birth year], 82 y.o., male Today's Date: 10/21/2022  END OF SESSION:  PT End of Session - 10/21/22 1347     Visit Number 12    Date for PT Re-Evaluation 12/05/22    Authorization Type [employer]    PT Start Time 1350    PT Stop Time 1430    PT Time Calculation (min) 40 min              Past Medical History:  Diagnosis Date   Cancer (HCC)    Skin cancer left ear and nose   Complication of anesthesia    Sinus Infection after back surgery 11/2021   GERD (gastroesophageal reflux disease)    Rheumatoid arthritis (HCC)    Past Surgical History:  Procedure Laterality Date   COLONOSCOPY  2021   EYE SURGERY Bilateral    cataract extraction   HEMORRHOID SURGERY     LAMINECTOMY WITH POSTERIOR LATERAL ARTHRODESIS LEVEL 3 N/A 11/20/2021   Procedure: LUMBAR TWO THROUGH LUMBAR THREE, LUMBAR THREE THROUGH LUMBAR FOUR, LUMBAR FOUR THROUGH LUMBAR FIVE LAMINECTOMY WITH FACETECTOMY, POSTERIOR SEGMENTAL INSTRUMENTATION, POSTEROLATERAL ARTHRODESIS;  Surgeon: Lisbeth Renshaw, MD;  Location: [facility];  Service: Neurosurgery;  Laterality: N/A;  3C   LYMPH NODE BIOPSY     Neck   MOHS SURGERY Left 2016   Ear. [facility] in [place]   TOTAL KNEE ARTHROPLASTY Left 08/23/2022   Procedure: LEFT TOTAL KNEE REPLACEMENT;  Surgeon: Tarry Kos, MD;  Location: [facility];  Service: Orthopedics;  Laterality: Left;   TRIGGER FINGER RELEASE Right    3rd   Patient Active Problem List   Diagnosis Date Noted   Numbness and tingling of fingertips 09/29/2022   Urinary retention 08/31/2022   Status post total left knee replacement 08/23/2022   Chronic rhinitis 02/15/2022   Eczema 02/15/2022   Spondylolisthesis of lumbar region 11/20/2021   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/2021   Lumbar spondylosis 10/20/2021   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-L5 level 10/20/2021   Primary osteoarthritis of left  knee 08/14/2021   Chronic epididymitis 08/13/2020   Hives 07/11/2020   Rheumatoid arthritis (HCC) 07/11/2020   Cervical arthritis 07/11/2020   Gastroesophageal reflux disease 07/11/2020   Presbyopia of both eyes 04/20/2015   Pseudophakia of both eyes 04/20/2015    PCP: Loyola Mast, MD  REFERRING PROVIDER: Tarry Kos, MD  REFERRING DIAG:  Diagnosis  M17.12 (ICD-10-CM) - Primary osteoarthritis of left knee    THERAPY DIAG:  S/P TKR (total knee replacement), left  Muscle weakness (generalized)  Difficulty in walking, not elsewhere classified  Localized edema  Rationale for Evaluation and Treatment: Rehabilitation  ONSET DATE: 08/23/22  SUBJECTIVE:   SUBJECTIVE STATEMENT:  feeling better. MD wanted me to try medicine again- slight HA but no dizziness.   PERTINENT HISTORY: RA, GERD, back surgery,  LTKR 08/23/22 ER visit 08/30/22 due to urinary retention PAIN:  Are you having pain? Yes: NPRS scale: 2/10 Pain location: L knee Pain description: Muscle spasms Aggravating factors: fatigue Relieving factors: ice  PRECAUTIONS: Knee and Fall  RED FLAGS: None   WEIGHT BEARING RESTRICTIONS: Yes LLE WBAT  FALLS:  Has patient fallen in last 6 months? No  LIVING ENVIRONMENT: Lives with: lives with their family and lives alone Lives in: House/apartment Stairs: No Has following equipment at home: Dan Humphreys - 2 wheeled, shower chair, and bed side commode  OCCUPATION: Retired, works out, likes to walk  PLOF:  to increased TKE SAQ with PTA to facilitate quad 2 sets 10  09/14/22 Nustep L 5 TUG and STS as not captured during eval but goals set 5 x STS 12.5 sec TUG with RW 14 sec Green tband HS curl seated 2 sets 10 Act flex 101 Pass 111 LAQ 3 sets 5 with PTA blocking hip flexor to prevent compensation TKE green tband 2 sets 10 Seated AROM   8  Pass 20 Fitter leg press 2 blue 2 sets 10 4inch step up with RW and PTA assist to facilitate knee ext 2 sets 10 Toe raises and calf stretching VASO medium with elevation for swelling 12 min psot ex and stretch  09/09/22 Education HEP review-QS, HS, SAQ, Hip IR, SLR, SLR with hip ER, seated long kicks, seated knee flexion VASO x 10 min for L knee inflammation and pain   PATIENT EDUCATION:  Education details: POC, HEP Person educated:  Patient Education method: Explanation Education comprehension: verbalized understanding  HOME EXERCISE PROGRAM: [id number]  ASSESSMENT:  CLINICAL IMPRESSION: assessed goals. Progressed ex with emphasis on TKE OBJECTIVE IMPAIRMENTS: Abnormal gait, decreased activity tolerance, decreased balance, decreased coordination, difficulty walking, decreased ROM, decreased strength, impaired flexibility, and pain.   ACTIVITY LIMITATIONS: carrying, lifting, bending, sitting, standing, squatting, sleeping, stairs, and locomotion level  PARTICIPATION LIMITATIONS: meal prep, cleaning, laundry, driving, shopping, and community activity  PERSONAL FACTORS: Past/current experiences are also affecting patient's functional outcome.   REHAB POTENTIAL: Good  CLINICAL DECISION MAKING: Evolving/moderate complexity  EVALUATION COMPLEXITY: Moderate   GOALS: Goals reviewed with patient? Yes  SHORT TERM GOALS: Target date: 08/23/22 I with initial HEP Baseline: Goal status: 09/14/22 met  2.  Increase R knee ROM to 5-100 Baseline:  Goal status:  progressing 10/13/22   10/21/22  4-115 MET  3.  Increase R knee strength to 3+/5 Baseline:  Goal status: met 10/05/22  LONG TERM GOALS: Target date: 12/02/22  I with final HEP Baseline:  Goal status: INITIAL  2.  Increase L knee strength to 5/5 Baseline:  Goal status: MET 10/13/22  3.  Increase L knee ROM to 0-120 Baseline:  Goal status: progressing 10/13/22   4-115 progressing 10/1022  4.  Patient will walk x at least 500' with LRAD, MI Baseline:  Goal status: progressing 09/30/22  MET 10/21/22  5.  Complete 5 x STS in < 12 seconds with equal WB through BLE Baseline:  Goal status: 09/14/22 progressing, 11.67s MET 10/13/22  6.  Complete TUG in < 12 sec with LRAD, MI Baseline:  Goal status: 09/14/22 progressing, 9.25s MET 10/13/22   PLAN:  PT FREQUENCY: 2x/week  PT DURATION: 12 weeks  PLANNED INTERVENTIONS: Therapeutic exercises, Therapeutic  activity, Neuromuscular re-education, Balance training, Gait training, Patient/Family education, Self Care, Joint mobilization, Cryotherapy, Moist heat, scar mobilization, Taping, Vasopneumatic device, Ionotophoresis 4mg /ml Dexamethasone, and Manual therapy  PLAN FOR NEXT SESSION: continue to push his ROM try to get his extension back.   Jessey Huyett,ANGIE, PTA 10/21/2022, 2:26 PM  [facility]. [place], [place], [address] Phone: [phone]   Fax:  [phone] [address] [place], [place], [address] Phone: [phone]   Fax:  [phone]  Patient Details  Name: Joshua Ruiz MRN: [id number] Date of Birth: Jul 07, 1940 Referring Provider:  Tarry Kos, MD  Encounter Date: 10/21/2022   Suanne Marker, PTA 10/21/2022, 2:26 PM  [facility]  to increased TKE SAQ with PTA to facilitate quad 2 sets 10  09/14/22 Nustep L 5 TUG and STS as not captured during eval but goals set 5 x STS 12.5 sec TUG with RW 14 sec Green tband HS curl seated 2 sets 10 Act flex 101 Pass 111 LAQ 3 sets 5 with PTA blocking hip flexor to prevent compensation TKE green tband 2 sets 10 Seated AROM   8  Pass 20 Fitter leg press 2 blue 2 sets 10 4inch step up with RW and PTA assist to facilitate knee ext 2 sets 10 Toe raises and calf stretching VASO medium with elevation for swelling 12 min psot ex and stretch  09/09/22 Education HEP review-QS, HS, SAQ, Hip IR, SLR, SLR with hip ER, seated long kicks, seated knee flexion VASO x 10 min for L knee inflammation and pain   PATIENT EDUCATION:  Education details: POC, HEP Person educated:  Patient Education method: Explanation Education comprehension: verbalized understanding  HOME EXERCISE PROGRAM: [id number]  ASSESSMENT:  CLINICAL IMPRESSION: assessed goals. Progressed ex with emphasis on TKE OBJECTIVE IMPAIRMENTS: Abnormal gait, decreased activity tolerance, decreased balance, decreased coordination, difficulty walking, decreased ROM, decreased strength, impaired flexibility, and pain.   ACTIVITY LIMITATIONS: carrying, lifting, bending, sitting, standing, squatting, sleeping, stairs, and locomotion level  PARTICIPATION LIMITATIONS: meal prep, cleaning, laundry, driving, shopping, and community activity  PERSONAL FACTORS: Past/current experiences are also affecting patient's functional outcome.   REHAB POTENTIAL: Good  CLINICAL DECISION MAKING: Evolving/moderate complexity  EVALUATION COMPLEXITY: Moderate   GOALS: Goals reviewed with patient? Yes  SHORT TERM GOALS: Target date: 08/23/22 I with initial HEP Baseline: Goal status: 09/14/22 met  2.  Increase R knee ROM to 5-100 Baseline:  Goal status:  progressing 10/13/22   10/21/22  4-115 MET  3.  Increase R knee strength to 3+/5 Baseline:  Goal status: met 10/05/22  LONG TERM GOALS: Target date: 12/02/22  I with final HEP Baseline:  Goal status: INITIAL  2.  Increase L knee strength to 5/5 Baseline:  Goal status: MET 10/13/22  3.  Increase L knee ROM to 0-120 Baseline:  Goal status: progressing 10/13/22   4-115 progressing 10/1022  4.  Patient will walk x at least 500' with LRAD, MI Baseline:  Goal status: progressing 09/30/22  MET 10/21/22  5.  Complete 5 x STS in < 12 seconds with equal WB through BLE Baseline:  Goal status: 09/14/22 progressing, 11.67s MET 10/13/22  6.  Complete TUG in < 12 sec with LRAD, MI Baseline:  Goal status: 09/14/22 progressing, 9.25s MET 10/13/22   PLAN:  PT FREQUENCY: 2x/week  PT DURATION: 12 weeks  PLANNED INTERVENTIONS: Therapeutic exercises, Therapeutic  activity, Neuromuscular re-education, Balance training, Gait training, Patient/Family education, Self Care, Joint mobilization, Cryotherapy, Moist heat, scar mobilization, Taping, Vasopneumatic device, Ionotophoresis 4mg /ml Dexamethasone, and Manual therapy  PLAN FOR NEXT SESSION: continue to push his ROM try to get his extension back.   Jessey Huyett,ANGIE, PTA 10/21/2022, 2:26 PM  [facility] at [facility]. [place], [place], [address] Phone: [phone]   Fax:  [phone] [address] [place], [place], [address] Phone: [phone]   Fax:  [phone]  Patient Details  Name: Joshua Ruiz MRN: [id number] Date of Birth: Jul 07, 1940 Referring Provider:  Tarry Kos, MD  Encounter Date: 10/21/2022   Suanne Marker, PTA 10/21/2022, 2:26 PM  [facility]  to increased TKE SAQ with PTA to facilitate quad 2 sets 10  09/14/22 Nustep L 5 TUG and STS as not captured during eval but goals set 5 x STS 12.5 sec TUG with RW 14 sec Green tband HS curl seated 2 sets 10 Act flex 101 Pass 111 LAQ 3 sets 5 with PTA blocking hip flexor to prevent compensation TKE green tband 2 sets 10 Seated AROM   8  Pass 20 Fitter leg press 2 blue 2 sets 10 4inch step up with RW and PTA assist to facilitate knee ext 2 sets 10 Toe raises and calf stretching VASO medium with elevation for swelling 12 min psot ex and stretch  09/09/22 Education HEP review-QS, HS, SAQ, Hip IR, SLR, SLR with hip ER, seated long kicks, seated knee flexion VASO x 10 min for L knee inflammation and pain   PATIENT EDUCATION:  Education details: POC, HEP Person educated:  Patient Education method: Explanation Education comprehension: verbalized understanding  HOME EXERCISE PROGRAM: [id number]  ASSESSMENT:  CLINICAL IMPRESSION: assessed goals. Progressed ex with emphasis on TKE OBJECTIVE IMPAIRMENTS: Abnormal gait, decreased activity tolerance, decreased balance, decreased coordination, difficulty walking, decreased ROM, decreased strength, impaired flexibility, and pain.   ACTIVITY LIMITATIONS: carrying, lifting, bending, sitting, standing, squatting, sleeping, stairs, and locomotion level  PARTICIPATION LIMITATIONS: meal prep, cleaning, laundry, driving, shopping, and community activity  PERSONAL FACTORS: Past/current experiences are also affecting patient's functional outcome.   REHAB POTENTIAL: Good  CLINICAL DECISION MAKING: Evolving/moderate complexity  EVALUATION COMPLEXITY: Moderate   GOALS: Goals reviewed with patient? Yes  SHORT TERM GOALS: Target date: 08/23/22 I with initial HEP Baseline: Goal status: 09/14/22 met  2.  Increase R knee ROM to 5-100 Baseline:  Goal status:  progressing 10/13/22   10/21/22  4-115 MET  3.  Increase R knee strength to 3+/5 Baseline:  Goal status: met 10/05/22  LONG TERM GOALS: Target date: 12/02/22  I with final HEP Baseline:  Goal status: INITIAL  2.  Increase L knee strength to 5/5 Baseline:  Goal status: MET 10/13/22  3.  Increase L knee ROM to 0-120 Baseline:  Goal status: progressing 10/13/22   4-115 progressing 10/1022  4.  Patient will walk x at least 500' with LRAD, MI Baseline:  Goal status: progressing 09/30/22  MET 10/21/22  5.  Complete 5 x STS in < 12 seconds with equal WB through BLE Baseline:  Goal status: 09/14/22 progressing, 11.67s MET 10/13/22  6.  Complete TUG in < 12 sec with LRAD, MI Baseline:  Goal status: 09/14/22 progressing, 9.25s MET 10/13/22   PLAN:  PT FREQUENCY: 2x/week  PT DURATION: 12 weeks  PLANNED INTERVENTIONS: Therapeutic exercises, Therapeutic  activity, Neuromuscular re-education, Balance training, Gait training, Patient/Family education, Self Care, Joint mobilization, Cryotherapy, Moist heat, scar mobilization, Taping, Vasopneumatic device, Ionotophoresis 4mg /ml Dexamethasone, and Manual therapy  PLAN FOR NEXT SESSION: continue to push his ROM try to get his extension back.   Jessey Huyett,ANGIE, PTA 10/21/2022, 2:26 PM  [facility] at [facility]. [place], [place], [address] Phone: [phone]   Fax:  [phone] [address] [place], [place], [address] Phone: [phone]   Fax:  [phone]  Patient Details  Name: Joshua Ruiz MRN: [id number] Date of Birth: Jul 07, 1940 Referring Provider:  Tarry Kos, MD  Encounter Date: 10/21/2022   Suanne Marker, PTA 10/21/2022, 2:26 PM  [facility]

## 2022-10-25 ENCOUNTER — Ambulatory Visit (INDEPENDENT_AMBULATORY_CARE_PROVIDER_SITE_OTHER): Payer: Medicare PPO

## 2022-10-25 DIAGNOSIS — Z0000 Encounter for general adult medical examination without abnormal findings: Secondary | ICD-10-CM

## 2022-10-25 NOTE — Patient Instructions (Signed)
Joshua Ruiz , Thank you for taking time to come for your Medicare Wellness Visit. I appreciate your ongoing commitment to your health goals. Please review the following plan we discussed and let me know if I can assist you in the future.   Referrals/Orders/Follow-Ups/Clinician Recommendations: none  This is a list of the screening recommended for you and due dates:  Health Maintenance  Topic Date Due   Zoster (Shingles) Vaccine (1 of 2) Never done   COVID-19 Vaccine (4 - 2023-24 season) 09/12/2022   Medicare Annual Wellness Visit  10/25/2023   DTaP/Tdap/Td vaccine (2 - Td or Tdap) 01/11/2025   Pneumonia Vaccine  Completed   HPV Vaccine  Aged Out   Flu Shot  Discontinued    Advanced directives: (Copy Requested) Please bring a copy of your health care power of attorney and living will to the office to be added to your chart at your convenience.  Next Medicare Annual Wellness Visit scheduled for next year: Yes  insert Preventive Care attachment Insert FALL PREVENTION attachment if needed

## 2022-10-25 NOTE — Progress Notes (Signed)
Subjective:   Joshua Ruiz is a 82 y.o. male who presents for Medicare Annual/Subsequent preventive examination.  Visit Complete: Virtual I connected with  Gwynne Edinger on 10/25/22 by a audio enabled telemedicine application and verified that I am speaking with the correct person using two identifiers.  Patient Location: Home  Provider Location: Office/Clinic  I discussed the limitations of evaluation and management by telemedicine. The patient expressed understanding and agreed to proceed.  Vital Signs: Because this visit was a virtual/telehealth visit, some criteria may be missing or patient reported. Any vitals not documented were not able to be obtained and vitals that have been documented are patient reported.    Cardiac Risk Factors include: advanced age (>48men, >2 women);male gender     Objective:    Today's Vitals   There is no height or weight on file to calculate BMI.     10/25/2022    8:47 AM 08/30/2022    9:29 PM 08/12/2022   11:14 AM 11/20/2021    6:11 AM 11/10/2021    2:24 PM 10/21/2021    9:25 AM 10/04/2020   11:24 AM  Advanced Directives  Does Patient Have a Medical Advance Directive? Yes  Yes Yes Yes Yes Yes  Type of Estate agent of [place];Living will Living will Healthcare Power of [place];Living will Healthcare Power of [place];Living will Healthcare Power of [place];Living will Healthcare Power of [place];Living will Healthcare Power of [place];Living will  Does patient want to make changes to medical advance directive?    No - Patient declined     Copy of Healthcare Power of Attorney in Chart? No - copy requested  No - copy requested No - copy requested No - copy requested No - copy requested No - copy requested    Current Medications (verified) Outpatient Encounter Medications as of 10/25/2022  Medication Sig   azelastine (ASTELIN) 0.1 % nasal spray Place 1 spray into both nostrils 2 (two) times daily.    cetirizine (ZYRTEC) 10 MG tablet Take 10 mg by mouth at bedtime.   docusate sodium (COLACE) 100 MG capsule Take 1 capsule (100 mg total) by mouth daily as needed.   finasteride (PROSCAR) 5 MG tablet Take 5 mg by mouth daily.   fluticasone (FLONASE) 50 MCG/ACT nasal spray Place into both nostrils.   folic acid (FOLVITE) 1 MG tablet Take 1 mg by mouth daily.   hydroxychloroquine (PLAQUENIL) 200 MG tablet Take 200 mg by mouth 2 (two) times daily.   ipratropium (ATROVENT) 0.06 % nasal spray Place 1 spray into both nostrils 2 (two) times daily.   loratadine (CLARITIN) 10 MG tablet Take 10 mg by mouth in the morning.   methotrexate (RHEUMATREX) 2.5 MG tablet Take 15 mg by mouth every Saturday. Caution:Chemotherapy. Protect from light.   mometasone (ELOCON) 0.1 % cream Apply topically daily.   Multiple Vitamins-Minerals (PRESERVISION AREDS 2) CAPS Take 1 capsule by mouth in the morning and at bedtime.   omeprazole (PRILOSEC) 20 MG capsule Take 20 mg by mouth in the morning and at bedtime.   tamsulosin (FLOMAX) 0.4 MG CAPS capsule Take 0.4 mg by mouth at bedtime.   aspirin EC 81 MG tablet Take 1 tablet (81 mg total) by mouth 2 (two) times daily. To be taken after surgery to prevent blood clots (Patient not taking: Reported on 10/25/2022)   ciprofloxacin (CIPRO) 500 MG tablet Take 500 mg by mouth 2 (two) times daily. (Patient not taking: Reported on 10/25/2022)   methocarbamol (ROBAXIN) 500 MG  tablet Take 1 tablet (500 mg total) by mouth every 6 (six) hours as needed for muscle spasms. (Patient not taking: Reported on 10/25/2022)   Multiple Vitamin (MULTIVITAMIN WITH MINERALS) TABS tablet Take 1 tablet by mouth daily. (Patient not taking: Reported on 10/25/2022)   ondansetron (ZOFRAN) 4 MG tablet Take 1 tablet (4 mg total) by mouth every 8 (eight) hours as needed for nausea or vomiting. (Patient not taking: Reported on 10/25/2022)   oxyCODONE-acetaminophen (PERCOCET) 5-325 MG tablet Take 1-2 tablets by  mouth every 6 (six) hours as needed. To be taken after surgery (Patient not taking: Reported on 10/25/2022)   sildenafil (VIAGRA) 100 MG tablet Take 100 mg by mouth daily as needed. (Patient not taking: Reported on 10/25/2022)   tadalafil (CIALIS) 5 MG tablet Take 5 mg by mouth daily. (Patient not taking: Reported on 10/25/2022)   No facility-administered encounter medications on file as of 10/25/2022.    Allergies (verified) Patient has no known allergies.   History: Past Medical History:  Diagnosis Date   Cancer (HCC)    Skin cancer left ear and nose   Complication of anesthesia    Sinus Infection after back surgery 11/2021   GERD (gastroesophageal reflux disease)    Rheumatoid arthritis (HCC)    Past Surgical History:  Procedure Laterality Date   COLONOSCOPY  2021   EYE SURGERY Bilateral    cataract extraction   HEMORRHOID SURGERY     LAMINECTOMY WITH POSTERIOR LATERAL ARTHRODESIS LEVEL 3 N/A 11/20/2021   Procedure: LUMBAR TWO THROUGH LUMBAR THREE, LUMBAR THREE THROUGH LUMBAR FOUR, LUMBAR FOUR THROUGH LUMBAR FIVE LAMINECTOMY WITH FACETECTOMY, POSTERIOR SEGMENTAL INSTRUMENTATION, POSTEROLATERAL ARTHRODESIS;  Surgeon: Lisbeth Renshaw, MD;  Location: [facility] OR;  Service: Neurosurgery;  Laterality: N/A;  3C   LYMPH NODE BIOPSY     Neck   MOHS SURGERY Left 2016   Ear. [facility] in [place]   TOTAL KNEE ARTHROPLASTY Left 08/23/2022   Procedure: LEFT TOTAL KNEE REPLACEMENT;  Surgeon: Tarry Kos, MD;  Location: [facility] OR;  Service: Orthopedics;  Laterality: Left;   TRIGGER FINGER RELEASE Right    3rd   Family History  Problem Relation Age of Onset   Rheum arthritis Mother    Diabetes Mother    Cancer Sister        Lung   Cancer Brother        Lung   Social History   Socioeconomic History   Marital status: Married    Spouse name: Not on file   Number of children: 1   Years of education: Not on file   Highest education level: Not on file  Occupational History    Not on file  Tobacco Use   Smoking status: Never    Passive exposure: Never   Smokeless tobacco: Never  Vaping Use   Vaping status: Never Used  Substance and Sexual Activity   Alcohol use: Not Currently    Comment: none   Drug use: Never   Sexual activity: Yes  Other Topics Concern   Not on file  Social History Narrative   Retired- Haematologist   Social Determinants of Health   Financial Resource Strain: Low Risk  (10/25/2022)   Overall Financial Resource Strain (CARDIA)    Difficulty of Paying Living Expenses: Not hard at all  Food Insecurity: No Food Insecurity (10/25/2022)   Hunger Vital Sign    Worried About Running Out of Food in the Last Year: Never true    Ran Out of Food  in the Last Year: Never true  Transportation Needs: No Transportation Needs (10/25/2022)   PRAPARE - Administrator, Civil Service (Medical): No    Lack of Transportation (Non-Medical): No  Physical Activity: Sufficiently Active (10/25/2022)   Exercise Vital Sign    Days of Exercise per Week: 3 days    Minutes of Exercise per Session: 50 min  Stress: No Stress Concern Present (10/25/2022)   [employer] of Occupational Health - Occupational Stress Questionnaire    Feeling of Stress : Not at all  Social Connections: Moderately Integrated (10/25/2022)   Social Connection and Isolation Panel [NHANES]    Frequency of Communication with Friends and Family: More than three times a week    Frequency of Social Gatherings with Friends and Family: Three times a week    Attends Religious Services: More than 4 times per year    Active Member of Clubs or Organizations: No    Attends Banker Meetings: Never    Marital Status: Married    Tobacco Counseling Counseling given: Not Answered   Clinical Intake:  Pre-visit preparation completed: Yes  Pain : No/denies pain     Nutritional Risks: None Diabetes: No  How often do you need to have someone help you when you read  instructions, pamphlets, or other written materials from your doctor or pharmacy?: 1 - Never  Interpreter Needed?: No  Information entered by :: NAllen LPN   Activities of Daily Living    10/25/2022    8:42 AM 08/12/2022   11:15 AM  In your present state of health, do you have any difficulty performing the following activities:  Hearing? 0   Vision? 0   Difficulty concentrating or making decisions? 0   Walking or climbing stairs? 0   Dressing or bathing? 0   Doing errands, shopping? 0 0  Preparing Food and eating ? N   Using the Toilet? N   In the past six months, have you accidently leaked urine? Y   Comment seeing urologist   Do you have problems with loss of bowel control? N   Managing your Medications? N   Managing your Finances? N   Housekeeping or managing your Housekeeping? N     Patient Care Team: Loyola Mast, MD as PCP - General (Family Medicine) Areatha Keas, MD as Referring Physician (Rheumatology)  Indicate any recent Medical Services you may have received from other than [facility] providers in the past year (date may be approximate).     Assessment:   This is a routine wellness examination for [place].  Hearing/Vision screen Hearing Screening - Comments:: Denies hearing issues Vision Screening - Comments:: Regular eye exams, Dr. Logan Bores   Goals Addressed             This Visit's Progress    Patient Stated       10/25/2022, working on knee       Depression Screen    10/25/2022    8:48 AM 08/16/2022    9:59 AM 06/17/2022   10:26 AM 10/21/2021    9:25 AM 10/04/2020   11:19 AM 07/11/2020    2:12 PM 07/11/2020    2:04 PM  PHQ 2/9 Scores  PHQ - 2 Score 0 0 0 0 0 0 0  PHQ- 9 Score 0     0 0    Fall Risk    10/25/2022    8:47 AM 08/16/2022    9:59 AM 06/17/2022   10:26 AM 02/15/2022  9:54 AM 10/21/2021    9:23 AM  Fall Risk   Falls in the past year? 0 0 0 0 0  Number falls in past yr: 0 0 0 0 0  Injury with Fall? 0 0 0 0 0  Risk for fall due to :  Medication side effect No Fall Risks No Fall Risks  No Fall Risks  Follow up Falls prevention discussed;Falls evaluation completed Falls evaluation completed Falls evaluation completed Falls evaluation completed Falls prevention discussed    MEDICARE RISK AT HOME: Medicare Risk at Home Any stairs in or around the home?: No If so, are there any without handrails?: No Home free of loose throw rugs in walkways, pet beds, electrical cords, etc?: Yes Adequate lighting in your home to reduce risk of falls?: Yes Life alert?: No Use of a cane, walker or w/c?: No Grab bars in the bathroom?: Yes Shower chair or bench in shower?: Yes Elevated toilet seat or a handicapped toilet?: No  TIMED UP AND GO:  Was the test performed?  No    Cognitive Function:        10/25/2022    8:48 AM 10/21/2021    9:26 AM 10/04/2020   11:24 AM  6CIT Screen  What Year? 0 points 0 points 0 points  What month? 0 points 0 points 0 points  What time? 0 points 0 points 0 points  Count back from 20 0 points 0 points 0 points  Months in reverse 2 points 0 points 0 points  Repeat phrase 2 points 0 points 4 points  Total Score 4 points 0 points 4 points    Immunizations Immunization History  Administered Date(s) Administered   PFIZER(Purple Top)SARS-COV-2 Vaccination 03/07/2019, 04/13/2019   PNEUMOCOCCAL CONJUGATE-20 01/13/2021   Pfizer Covid-19 Vaccine Bivalent Booster 20yrs & up 02/06/2020   Tdap 01/12/2015    TDAP status: Up to date  Flu Vaccine status: Declined, Education has been provided regarding the importance of this vaccine but patient still declined. Advised may receive this vaccine at local pharmacy or Health Dept. Aware to provide a copy of the vaccination record if obtained from local pharmacy or Health Dept. Verbalized acceptance and understanding.  Pneumococcal vaccine status: Up to date  Covid-19 vaccine status: Information provided on how to obtain vaccines.   Qualifies for Shingles  Vaccine? Yes   Zostavax completed No   Shingrix Completed?: No.    Education has been provided regarding the importance of this vaccine. Patient has been advised to call insurance company to determine out of pocket expense if they have not yet received this vaccine. Advised may also receive vaccine at local pharmacy or Health Dept. Verbalized acceptance and understanding.  Screening Tests Health Maintenance  Topic Date Due   Zoster Vaccines- Shingrix (1 of 2) Never done   COVID-19 Vaccine (4 - 2023-24 season) 09/12/2022   Medicare Annual Wellness (AWV)  10/25/2023   DTaP/Tdap/Td (2 - Td or Tdap) 01/11/2025   Pneumonia Vaccine 77+ Years old  Completed   HPV VACCINES  Aged Out   INFLUENZA VACCINE  Discontinued    Health Maintenance  Health Maintenance Due  Topic Date Due   Zoster Vaccines- Shingrix (1 of 2) Never done   COVID-19 Vaccine (4 - 2023-24 season) 09/12/2022    Colorectal cancer screening: No longer required.   Lung Cancer Screening: (Low Dose CT Chest recommended if Age 17-80 years, 20 pack-year currently smoking OR have quit w/in 15years.) does not qualify.   Lung Cancer Screening Referral:  no  Additional Screening:  Hepatitis C Screening: does not qualify;   Vision Screening: Recommended annual ophthalmology exams for early detection of glaucoma and other disorders of the eye. Is the patient up to date with their annual eye exam?  Yes  Who is the provider or what is the name of the office in which the patient attends annual eye exams? Dr. Logan Bores If pt is not established with a provider, would they like to be referred to a provider to establish care? No .   Dental Screening: Recommended annual dental exams for proper oral hygiene  Diabetic Foot Exam: n/a  Community Resource Referral / Chronic Care Management: CRR required this visit?  No   CCM required this visit?  No     Plan:     I have personally reviewed and noted the following in the patient's chart:    Medical and social history Use of alcohol, tobacco or illicit drugs  Current medications and supplements including opioid prescriptions. Patient is not currently taking opioid prescriptions. Functional ability and status Nutritional status Physical activity Advanced directives List of other physicians Hospitalizations, surgeries, and ER visits in previous 12 months Vitals Screenings to include cognitive, depression, and falls Referrals and appointments  In addition, I have reviewed and discussed with patient certain preventive protocols, quality metrics, and best practice recommendations. A written personalized care plan for preventive services as well as general preventive health recommendations were provided to patient.     Barb Merino, LPN   40/98/1191   After Visit Summary: (Pick Up) Due to this being a telephonic visit, with patients personalized plan was offered to patient and patient has requested to Pick up at office.  Nurse Notes: none

## 2022-10-26 ENCOUNTER — Ambulatory Visit: Payer: Medicare PPO | Admitting: Physical Therapy

## 2022-10-26 DIAGNOSIS — M6281 Muscle weakness (generalized): Secondary | ICD-10-CM | POA: Diagnosis not present

## 2022-10-26 DIAGNOSIS — R262 Difficulty in walking, not elsewhere classified: Secondary | ICD-10-CM

## 2022-10-26 DIAGNOSIS — Z96652 Presence of left artificial knee joint: Secondary | ICD-10-CM

## 2022-10-26 DIAGNOSIS — R600 Localized edema: Secondary | ICD-10-CM

## 2022-10-26 NOTE — Therapy (Signed)
[facility] at [facility] [address] [place], [place], [address] Phone: [phone]   Fax:  [phone] [address] [place], [place], [address] Phone: [phone]   Fax:  [phone]  Patient Details  Name: Joshua Ruiz MRN: [id number] Date of Birth: [birth year] Referring Provider:  Tarry Kos, MD  Encounter Date: 10/26/2022  [facility] [address] [facility] [address] [place], [place], [address] Phone: [phone]   Fax:  [phone] [address] [place], [place], [address] Phone: [phone]   Fax:  [phone]  Patient Details  Name: Joshua Ruiz MRN: [id number] Date of Birth: [birth year] Referring Provider:  Tarry Kos, MD  Encounter Date: 10/26/2022  OUTPATIENT PHYSICAL THERAPY LOWER EXTREMITY    Patient Name: Joshua Ruiz MRN: [id number] DOB:December 20, 1940, 82 y.o., male Today's Date: 10/26/2022  END OF SESSION:  PT End of Session - 10/26/22 1357     Visit Number 13    Date for PT Re-Evaluation 12/05/22    Authorization Type [employer]    PT Start Time 1400    PT Stop Time 1445    PT Time Calculation (min) 45 min              Past Medical History:  Diagnosis Date   Cancer (HCC)    Skin cancer left ear and nose   Complication of anesthesia    Sinus Infection after back surgery 11/2021   GERD (gastroesophageal reflux disease)    Rheumatoid arthritis (HCC)    Past Surgical History:  Procedure Laterality Date   COLONOSCOPY  2021   EYE SURGERY Bilateral    cataract extraction   HEMORRHOID SURGERY     LAMINECTOMY WITH POSTERIOR LATERAL ARTHRODESIS LEVEL 3 N/A 11/20/2021   Procedure: LUMBAR TWO THROUGH LUMBAR THREE, LUMBAR THREE THROUGH LUMBAR FOUR, LUMBAR FOUR THROUGH LUMBAR FIVE LAMINECTOMY WITH FACETECTOMY, POSTERIOR SEGMENTAL INSTRUMENTATION, POSTEROLATERAL ARTHRODESIS;  Surgeon: Lisbeth Renshaw, MD;  Location: [facility] OR;  Service: Neurosurgery;  Laterality: N/A;  3C   LYMPH NODE BIOPSY     Neck   MOHS SURGERY Left 2016   [facility]ar. [facility] in [place]   TOTAL KNEE ARTHROPLASTY Left 08/23/2022   Procedure: LEFT TOTAL KNEE REPLACEMENT;  Surgeon: Tarry Kos, MD;  Location: [facility] OR;  Service: Orthopedics;  Laterality: Left;   TRIGGER FINGER RELEASE Right    3rd   Patient Active Problem List   Diagnosis Date Noted   Numbness and tingling of fingertips 09/29/2022   Urinary retention 08/31/2022   Status post total left knee replacement 08/23/2022   Chronic rhinitis 02/15/2022   Eczema 02/15/2022   Spondylolisthesis of lumbar region 11/20/2021   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/2021   Lumbar spondylosis 10/20/2021   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-L5 level 10/20/2021   Primary osteoarthritis of left  knee 08/14/2021   Chronic epididymitis 08/13/2020   Hives 07/11/2020   Rheumatoid arthritis (HCC) 07/11/2020   Cervical arthritis 07/11/2020   Gastroesophageal reflux disease 07/11/2020   Presbyopia of both eyes 04/20/2015   Pseudophakia of both eyes 04/20/2015    PCP: Loyola Mast, MD  REFERRING PROVIDER: Tarry Kos, MD  REFERRING DIAG:  Diagnosis  M17.12 (ICD-10-CM) - Primary osteoarthritis of left knee    THERAPY DIAG:  S/P TKR (total knee replacement), left  Muscle weakness (generalized)  Difficulty in walking, not elsewhere classified  Localized edema  Rationale for Evaluation and Treatment: Rehabilitation  ONSET DATE: 08/23/22  SUBJECTIVE:   SUBJECTIVE STATEMENT:  feeling better. Medicine does not seem to be effecting me this time. Knee is good  PERTINENT HISTORY: RA, GERD, back surgery,  LTKR 08/23/22 ER visit 08/30/22 due to urinary retention PAIN:  Are you having pain? Yes: NPRS scale: 2/10 Pain location: L knee Pain description: Muscle spasms Aggravating factors: fatigue Relieving factors: ice  PRECAUTIONS: Knee and Fall  RED FLAGS: None   WEIGHT BEARING RESTRICTIONS: Yes LLE WBAT  FALLS:  Has patient fallen in last 6 months? No  LIVING ENVIRONMENT: Lives with: lives with their family and lives alone Lives in: House/apartment Stairs: No Has following equipment at home: Dan Humphreys - 2 wheeled, shower chair, and bed side commode  OCCUPATION: Retired, works out, likes to walk  PLOF:  [facility] [address] [facility] [address] [place], [place], [address] Phone: [phone]   Fax:  [phone] [address] [place], [place], [address] Phone: [phone]   Fax:  [phone]  Patient Details  Name: Joshua Ruiz MRN: [id number] Date of Birth: [birth year] Referring Provider:  Tarry Kos, MD  Encounter Date: 10/26/2022  [facility] [address] [facility] [address] [place], [place], [address] Phone: [phone]   Fax:  [phone] [address] [place], [place], [address] Phone: [phone]   Fax:  [phone]  Patient Details  Name: Joshua Ruiz MRN: [id number] Date of Birth: [birth year] Referring Provider:  Tarry Kos, MD  Encounter Date: 10/26/2022

## 2022-10-28 ENCOUNTER — Ambulatory Visit: Payer: Medicare PPO | Admitting: Physical Therapy

## 2022-10-28 ENCOUNTER — Encounter: Payer: Self-pay | Admitting: Physical Therapy

## 2022-10-28 DIAGNOSIS — M6281 Muscle weakness (generalized): Secondary | ICD-10-CM

## 2022-10-28 DIAGNOSIS — R262 Difficulty in walking, not elsewhere classified: Secondary | ICD-10-CM

## 2022-10-28 DIAGNOSIS — Z96652 Presence of left artificial knee joint: Secondary | ICD-10-CM

## 2022-10-28 DIAGNOSIS — R600 Localized edema: Secondary | ICD-10-CM

## 2022-10-28 NOTE — Therapy (Signed)
OUTPATIENT PHYSICAL THERAPY LOWER EXTREMITY    Patient Name: Joshua Ruiz MRN: [id number] DOB:11/01/1940, 82 y.o., male Today's Date: 10/28/2022  END OF SESSION:  PT End of Session - 10/28/22 1353     Visit Number 14    Number of Visits 24    Date for PT Re-Evaluation 12/05/22    Authorization Type [employer] 13/24    PT Start Time 1351    PT Stop Time 1439    PT Time Calculation (min) 48 min    Activity Tolerance Patient tolerated treatment well    Behavior During Therapy [facility] for tasks assessed/performed              Past Medical History:  Diagnosis Date   Cancer (HCC)    Skin cancer left ear and nose   Complication of anesthesia    Sinus Infection after back surgery 11/2021   GERD (gastroesophageal reflux disease)    Rheumatoid arthritis (HCC)    Past Surgical History:  Procedure Laterality Date   COLONOSCOPY  2021   EYE SURGERY Bilateral    cataract extraction   HEMORRHOID SURGERY     LAMINECTOMY WITH POSTERIOR LATERAL ARTHRODESIS LEVEL 3 N/A 11/20/2021   Procedure: LUMBAR TWO THROUGH LUMBAR THREE, LUMBAR THREE THROUGH LUMBAR FOUR, LUMBAR FOUR THROUGH LUMBAR FIVE LAMINECTOMY WITH FACETECTOMY, POSTERIOR SEGMENTAL INSTRUMENTATION, POSTEROLATERAL ARTHRODESIS;  Surgeon: Lisbeth Renshaw, MD;  Location: [facility] OR;  Service: Neurosurgery;  Laterality: N/A;  3C   LYMPH NODE BIOPSY     Neck   MOHS SURGERY Left 2016   Ear. [facility] in [place]   TOTAL KNEE ARTHROPLASTY Left 08/23/2022   Procedure: LEFT TOTAL KNEE REPLACEMENT;  Surgeon: Tarry Kos, MD;  Location: [facility] OR;  Service: Orthopedics;  Laterality: Left;   TRIGGER FINGER RELEASE Right    3rd   Patient Active Problem List   Diagnosis Date Noted   Numbness and tingling of fingertips 09/29/2022   Urinary retention 08/31/2022   Status post total left knee replacement 08/23/2022   Chronic rhinitis 02/15/2022   Eczema 02/15/2022   Spondylolisthesis of lumbar region 11/20/2021   Lumbar stenosis with  neurogenic claudication 10/20/2021   Lumbar spondylosis 10/20/2021   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-L5 level 10/20/2021   Primary osteoarthritis of left knee 08/14/2021   Chronic epididymitis 08/13/2020   Hives 07/11/2020   Rheumatoid arthritis (HCC) 07/11/2020   Cervical arthritis 07/11/2020   Gastroesophageal reflux disease 07/11/2020   Presbyopia of both eyes 04/20/2015   Pseudophakia of both eyes 04/20/2015    PCP: Loyola Mast, MD  REFERRING PROVIDER: Tarry Kos, MD  REFERRING DIAG:  Diagnosis  M17.12 (ICD-10-CM) - Primary osteoarthritis of left knee    THERAPY DIAG:  S/P TKR (total knee replacement), left  Muscle weakness (generalized)  Difficulty in walking, not elsewhere classified  Localized edema  Rationale for Evaluation and Treatment: Rehabilitation  ONSET DATE: 08/23/22  SUBJECTIVE:   SUBJECTIVE STATEMENT:  Doing well stiff and tight, not really any pain  PERTINENT HISTORY: RA, GERD, back surgery,  LTKR 08/23/22 ER visit 08/30/22 due to urinary retention PAIN:  Are you having pain? Yes: NPRS scale: 2/10 Pain location: L knee Pain description: Muscle spasms Aggravating factors: fatigue Relieving factors: ice  PRECAUTIONS: Knee and Fall  RED FLAGS: None   WEIGHT BEARING RESTRICTIONS: Yes LLE WBAT  FALLS:  Has patient fallen in last 6 months? No  LIVING ENVIRONMENT: Lives with: lives with their family and lives alone Lives in: House/apartment Stairs: No Has following equipment at  home: Dan Humphreys - 2 wheeled, shower chair, and bed side commode  OCCUPATION: Retired, works out, likes to walk  PLOF: Independent  PATIENT GOALS: Return to his walking routine, less pain, recover his strength  NEXT MD VISIT: 4 weeks  OBJECTIVE:   DIAGNOSTIC FINDINGS:  Knee X ray /12/24 FINDINGS: Two-view show total knee arthroplasty. Components appear well positioned. No unexpected finding. Air and fluid in the operative region as expected.    IMPRESSION: Good appearance following total knee arthroplasty.  COGNITION: Overall cognitive status: Within functional limits for tasks assessed     SENSATION: WFL  EDEMA:  Mod swelling in knee and lower leg, red and warm  POSTURE: rounded shoulders, forward head, and weight shift right  PALPATION: L knee tight and TTP with mild redness and swelling.  LOWER EXTREMITY ROM:  Passive ROM Right eval Left eval Left AROM 09/30/22 L AROM 10/13/22 Left PROM 10/28/22  Hip flexion       Hip extension       Hip abduction       Hip adduction       Hip internal rotation       Hip external rotation       Knee flexion  101 114 110   Knee extension  -20 12 8  0 P!  Ankle dorsiflexion       Ankle plantarflexion       Ankle inversion       Ankle eversion        (Blank rows = not tested)  LOWER EXTREMITY MMT: RLE 5/5  MMT Right eval Left eval Left  10/13/22  Hip flexion  3+ 5  Hip extension     Hip abduction     Hip adduction     Hip internal rotation     Hip external rotation  3/5   Knee flexion  3+ 5  Knee extension  3- 5  Ankle dorsiflexion     Ankle plantarflexion     Ankle inversion     Ankle eversion      (Blank rows = not tested)  FUNCTIONAL TESTS:  5 times sit to stand: TBD Timed up and go (TUG): TBD  GAIT: Distance walked: In clinic distances Assistive device utilized: Environmental consultant - 2 wheeled Level of assistance: Modified independence Comments: Step to gait pattern, slow, cautious, antalgic on L.   TODAY'S TREATMENT:                                                                                                                              DATE: 10/28/22 Nustep level 5 x 5 minutes Bike level 5 x 5 minutes Gait around the back building good speed, one longer rest break today was very short of breath Leg curls 35# 2x10 LEg extension 10# eccentrics left  Tmill push fwd and backward cues to extend the left knee Leg press 40# x10, no wieght working  Feet on  ball bridges PROM left knee  extension  10/26/22 Bike L 4 Nustep L 5 min Step up-down 6 inch 10 x with cuing needed 50# cable press down 2 sets 10 Mini squat with TKE black tband 2 sets 10 Knee ext 10# 10 x hold 3 sec, SL 5# 10 x hold 3 sec Leg curls 35# 2x10 with stretch into extension b/n sets and after LAQ 3 # 2 sets 10 with focus on TKE TKE sitting tband 2 sets 10 PROM ext   10/21/22 Nustep L 6 5 min LE only with focus on TKE Bike Full Rev 5 min L 4 LLE leg press 20# focus on TKE with cuing verb and tactile 10x then 10 x with green tband 10 x Leg press 20# calf raises 2 sets 10 Leg curls 35# 2x10 with stretch into extension b/n sets and after KNEE LLE  only extension 5# really focus on TKE left eccentrics PROM flex and ext, patellar mobs   10/19/22 Nustep level 5x5 minutes LE's only Bike full revs 5 minutes level 4 Gait outside around the back building one rest break when coming up the hill Leg curls 35# 2x10 with stretch into extension b/n sets and after Leg extension 5# really focus on TKE left eccentrics Passive knee extension stretch 3# SAQ cues for TKE  10/13/22 Progress note, recheck goals  NuStep L5 x8mins  Bike L3 x30mins  Calf stretch 30s Calf raises 2x10 Leg ext 2x10 10#, eccentric lowering L x10, 5# LLE only x10 HS curls 25# 2x10, 15# LLE x10 PROM flex/ext, patellar mobs STS holding yellow ball  2x10    10/07/22 Nustep level 5 x 5 mintues Bike level 4 x 5 minutes Left only leg curls 15# 3x10, stretch into extension b/n sets 5# leg extension left only cues for TKE Tmill push fwd only Calf stretches On airex cone toe touches with and without cane Stairs step over step Fast walk with light HHA trying to get him to take bigger steps  On airex ball toss Asked him to do heel prop extension stretch over the weekend  10/05/22 Bike level 4 x 5 minutes Gait around the back building no device, slow, decreased knee extension, 1 rest brace Resisted gait  all directions 40# leg curls left only 15# 3x10 with passive stretch b/n sets 5# leg extension left only small ROM focus on TKE Leg press 40# both legs x10, left only 20# 2x8 Passive stretch flexion and extension Vaso medium pressure 36 degrees  09/30/22 Nustep level 5 x 6 minutes Bike level 3 x 5 minutes Gait outside with a SPC around the parking island Leg curls 25# 2x10, 15# left only 2 x10 Leg extension 5# 2x10 Leg press 20# 2x10, then left only x10 then no weight x10 Passive stretch flexion and extension, scar mobilization Vaso medium pressure in elevation left knee 35 degrees F  09/28/22 Bike L 3 full revolutions Nustep L 5 5 min LE only HS curls both legs 25# 2x10 LLE only 10x 20# 5# LLE knee ext 3 sets 5 Black bar DF and PF 15 x each 30# resisted gait backward 10 x for TKE Amb without AD working on NOT ER left foot and increased cadeance TKE long sitting with green tband 2 sets 10 PROM flex and ext with emphasis on ext SL leg press 20# focus on TKE 2 sets 10- seat #9 weakness with push off Fitter ext 2 sets 10    09/23/22 Nustep level 5 x 6 minutes Bike partial revs x  4 minutes HS curls both legs 25# 2x10 Leg press 20# 3x10 some cues to focus on the left TKE, left leg only no weight x 10 LAQ 3# 2x10 cues to get leg straight Gait SPC with CGA around the parking [employer] 2# cues for TKE left 2x10 Passive stretch into extension Vaso in elevation high pressure 36 degrees F  09/21/22 Nustep L 5 HS curl green tband 2 sets 10 LAQ 3# 2 sets 10 TKE standing green tband 2 sets 10 Black bar toe raises and heel stretch 2 sets 10 6 in step up 2 sets 10 focus on TKE PROM flex and ext with belt mobs for ext Amb with SPC 100 feet 2 x, good stride of step through , good balance but missing heel strike on left and decreased ext VASO 10 min at end of session for inflammation  09/16/22 Nustep L 5 focus on TKE HS curl green tband 2 sets 10 LAQ yellow tband 2  sets 10- support to not engage hip flexor TKE red tband 2 sets 10 PROM to increased TKE SAQ with PTA to facilitate quad 2 sets 10  09/14/22 Nustep L 5 TUG and STS as not captured during eval but goals set 5 x STS 12.5 sec TUG with RW 14 sec Green tband HS curl seated 2 sets 10 Act flex 101 Pass 111 LAQ 3 sets 5 with PTA blocking hip flexor to prevent compensation TKE green tband 2 sets 10 Seated AROM   8  Pass 20 Fitter leg press 2 blue 2 sets 10 4inch step up with RW and PTA assist to facilitate knee ext 2 sets 10 Toe raises and calf stretching VASO medium with elevation for swelling 12 min psot ex and stretch  09/09/22 Education HEP review-QS, HS, SAQ, Hip IR, SLR, SLR with hip ER, seated long kicks, seated knee flexion VASO x 10 min for L knee inflammation and pain   PATIENT EDUCATION:  Education details: POC, HEP Person educated: Patient Education method: Explanation Education comprehension: verbalized understanding  HOME EXERCISE PROGRAM: [id number]  ASSESSMENT:  CLINICAL IMPRESSION:. Patient doing well, a little faster on his walking, he still turns the left leg out a little and has a little bit of flexion, he still fatigues significantly when walking around the back building and going up the slope, needs a good rest to catch his breath OBJECTIVE IMPAIRMENTS: Abnormal gait, decreased activity tolerance, decreased balance, decreased coordination, difficulty walking, decreased ROM, decreased strength, impaired flexibility, and pain.   ACTIVITY LIMITATIONS: carrying, lifting, bending, sitting, standing, squatting, sleeping, stairs, and locomotion level  PARTICIPATION LIMITATIONS: meal prep, cleaning, laundry, driving, shopping, and community activity  PERSONAL FACTORS: Past/current experiences are also affecting patient's functional outcome.   REHAB POTENTIAL: Good  CLINICAL DECISION MAKING: Evolving/moderate complexity  EVALUATION COMPLEXITY:  Moderate   GOALS: Goals reviewed with patient? Yes  SHORT TERM GOALS: Target date: 08/23/22 I with initial HEP Baseline: Goal status: 09/14/22 met  2.  Increase R knee ROM to 5-100 Baseline:  Goal status:  progressing 10/13/22   10/21/22  4-115 MET  3.  Increase R knee strength to 3+/5 Baseline:  Goal status: met 10/05/22  LONG TERM GOALS: Target date: 12/02/22  I with final HEP Baseline:  Goal status: progressing 10/28/22  2.  Increase L knee strength to 5/5 Baseline:  Goal status: MET 10/13/22  3.  Increase L knee ROM to 0-120 Baseline:  Goal status: progressing 10/13/22   4-115 progressing 10/1022  4.  Patient will walk x at least 500' with LRAD, MI Baseline:  Goal status: progressing 09/30/22  MET 10/21/22  5.  Complete 5 x STS in < 12 seconds with equal WB through BLE Baseline:  Goal status: 09/14/22 progressing, 11.67s MET 10/13/22  6.  Complete TUG in < 12 sec with LRAD, MI Baseline:  Goal status: 09/14/22 progressing, 9.25s MET 10/13/22   PLAN:  PT FREQUENCY: 2x/week  PT DURATION: 12 weeks  PLANNED INTERVENTIONS: Therapeutic exercises, Therapeutic activity, Neuromuscular re-education, Balance training, Gait training, Patient/Family education, Self Care, Joint mobilization, Cryotherapy, Moist heat, scar mobilization, Taping, Vasopneumatic device, Ionotophoresis 4mg /ml Dexamethasone, and Manual therapy  PLAN FOR NEXT SESSION: continue to push his ROM try to get his extension back.   Jearld Lesch, PT 10/28/2022, 1:54 PM

## 2022-10-29 DIAGNOSIS — D2362 Other benign neoplasm of skin of left upper limb, including shoulder: Secondary | ICD-10-CM | POA: Diagnosis not present

## 2022-10-29 DIAGNOSIS — D2371 Other benign neoplasm of skin of right lower limb, including hip: Secondary | ICD-10-CM | POA: Diagnosis not present

## 2022-10-29 DIAGNOSIS — D235 Other benign neoplasm of skin of trunk: Secondary | ICD-10-CM | POA: Diagnosis not present

## 2022-10-29 DIAGNOSIS — L821 Other seborrheic keratosis: Secondary | ICD-10-CM | POA: Diagnosis not present

## 2022-10-29 DIAGNOSIS — D2372 Other benign neoplasm of skin of left lower limb, including hip: Secondary | ICD-10-CM | POA: Diagnosis not present

## 2022-10-29 DIAGNOSIS — D2361 Other benign neoplasm of skin of right upper limb, including shoulder: Secondary | ICD-10-CM | POA: Diagnosis not present

## 2022-10-29 DIAGNOSIS — Z85828 Personal history of other malignant neoplasm of skin: Secondary | ICD-10-CM | POA: Diagnosis not present

## 2022-10-29 DIAGNOSIS — Z08 Encounter for follow-up examination after completed treatment for malignant neoplasm: Secondary | ICD-10-CM | POA: Diagnosis not present

## 2022-10-29 DIAGNOSIS — D2339 Other benign neoplasm of skin of other parts of face: Secondary | ICD-10-CM | POA: Diagnosis not present

## 2022-11-02 ENCOUNTER — Encounter: Payer: Self-pay | Admitting: Physical Therapy

## 2022-11-02 ENCOUNTER — Ambulatory Visit: Payer: Medicare PPO | Admitting: Physical Therapy

## 2022-11-02 DIAGNOSIS — R600 Localized edema: Secondary | ICD-10-CM

## 2022-11-02 DIAGNOSIS — M6281 Muscle weakness (generalized): Secondary | ICD-10-CM | POA: Diagnosis not present

## 2022-11-02 DIAGNOSIS — R262 Difficulty in walking, not elsewhere classified: Secondary | ICD-10-CM

## 2022-11-02 DIAGNOSIS — Z96652 Presence of left artificial knee joint: Secondary | ICD-10-CM | POA: Diagnosis not present

## 2022-11-02 NOTE — Therapy (Signed)
OUTPATIENT PHYSICAL THERAPY LOWER EXTREMITY    Patient Name: Joshua Ruiz MRN: [id number] DOB:1940/06/02, 82 y.o., male Today's Date: 11/02/2022  END OF SESSION:  PT End of Session - 11/02/22 1355     Visit Number 15    Date for PT Re-Evaluation 12/05/22    Authorization Type [employer] 14/24    PT Start Time 1355    PT Stop Time 1440    PT Time Calculation (min) 45 min    Activity Tolerance Patient tolerated treatment well    Behavior During Therapy [facility] for tasks assessed/performed              Past Medical History:  Diagnosis Date   Cancer (HCC)    Skin cancer left ear and nose   Complication of anesthesia    Sinus Infection after back surgery 11/2021   GERD (gastroesophageal reflux disease)    Rheumatoid arthritis (HCC)    Past Surgical History:  Procedure Laterality Date   COLONOSCOPY  2021   EYE SURGERY Bilateral    cataract extraction   HEMORRHOID SURGERY     LAMINECTOMY WITH POSTERIOR LATERAL ARTHRODESIS LEVEL 3 N/A 11/20/2021   Procedure: LUMBAR TWO THROUGH LUMBAR THREE, LUMBAR THREE THROUGH LUMBAR FOUR, LUMBAR FOUR THROUGH LUMBAR FIVE LAMINECTOMY WITH FACETECTOMY, POSTERIOR SEGMENTAL INSTRUMENTATION, POSTEROLATERAL ARTHRODESIS;  Surgeon: Lisbeth Renshaw, MD;  Location: [facility] OR;  Service: Neurosurgery;  Laterality: N/A;  3C   LYMPH NODE BIOPSY     Neck   MOHS SURGERY Left 2016   Ear. [facility] in [place]   TOTAL KNEE ARTHROPLASTY Left 08/23/2022   Procedure: LEFT TOTAL KNEE REPLACEMENT;  Surgeon: Tarry Kos, MD;  Location: [facility] OR;  Service: Orthopedics;  Laterality: Left;   TRIGGER FINGER RELEASE Right    3rd   Patient Active Problem List   Diagnosis Date Noted   Numbness and tingling of fingertips 09/29/2022   Urinary retention 08/31/2022   Status post total left knee replacement 08/23/2022   Chronic rhinitis 02/15/2022   Eczema 02/15/2022   Spondylolisthesis of lumbar region 11/20/2021   Lumbar stenosis with neurogenic claudication  10/20/2021   Lumbar spondylosis 10/20/2021   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-L5 level 10/20/2021   Primary osteoarthritis of left knee 08/14/2021   Chronic epididymitis 08/13/2020   Hives 07/11/2020   Rheumatoid arthritis (HCC) 07/11/2020   Cervical arthritis 07/11/2020   Gastroesophageal reflux disease 07/11/2020   Presbyopia of both eyes 04/20/2015   Pseudophakia of both eyes 04/20/2015    PCP: Loyola Mast, MD  REFERRING PROVIDER: Tarry Kos, MD  REFERRING DIAG:  Diagnosis  M17.12 (ICD-10-CM) - Primary osteoarthritis of left knee    THERAPY DIAG:  S/P TKR (total knee replacement), left  Muscle weakness (generalized)  Difficulty in walking, not elsewhere classified  Localized edema  Rationale for Evaluation and Treatment: Rehabilitation  ONSET DATE: 08/23/22  SUBJECTIVE:   SUBJECTIVE STATEMENT:  [place] a little stiff  PERTINENT HISTORY: RA, GERD, back surgery,  LTKR 08/23/22 ER visit 08/30/22 due to urinary retention PAIN:  Are you having pain? Yes: NPRS scale: 2/10 Pain location: L knee Pain description: Muscle spasms Aggravating factors: fatigue Relieving factors: ice  PRECAUTIONS: Knee and Fall  RED FLAGS: None   WEIGHT BEARING RESTRICTIONS: Yes LLE WBAT  FALLS:  Has patient fallen in last 6 months? No  LIVING ENVIRONMENT: Lives with: lives with their family and lives alone Lives in: House/apartment Stairs: No Has following equipment at home: Dan Humphreys - 2  wheeled, shower chair, and bed side commode  OCCUPATION: Retired, works out, likes to walk  PLOF: Independent  PATIENT GOALS: Return to his walking routine, less pain, recover his strength  NEXT MD VISIT: 4 weeks  OBJECTIVE:   DIAGNOSTIC FINDINGS:  Knee X ray /12/24 FINDINGS: Two-view show total knee arthroplasty. Components appear well positioned. No unexpected finding. Air and fluid in the operative region as expected.   IMPRESSION: Good  appearance following total knee arthroplasty.  COGNITION: Overall cognitive status: Within functional limits for tasks assessed     SENSATION: WFL  EDEMA:  Mod swelling in knee and lower leg, red and warm  POSTURE: rounded shoulders, forward head, and weight shift right  PALPATION: L knee tight and TTP with mild redness and swelling.  LOWER EXTREMITY ROM:  Passive ROM Right eval Left eval Left AROM 09/30/22 L AROM 10/13/22 Left PROM 10/28/22  Hip flexion       Hip extension       Hip abduction       Hip adduction       Hip internal rotation       Hip external rotation       Knee flexion  101 114 110   Knee extension  -20 12 8  0 P!  Ankle dorsiflexion       Ankle plantarflexion       Ankle inversion       Ankle eversion        (Blank rows = not tested)  LOWER EXTREMITY MMT: RLE 5/5  MMT Right eval Left eval Left  10/13/22  Hip flexion  3+ 5  Hip extension     Hip abduction     Hip adduction     Hip internal rotation     Hip external rotation  3/5   Knee flexion  3+ 5  Knee extension  3- 5  Ankle dorsiflexion     Ankle plantarflexion     Ankle inversion     Ankle eversion      (Blank rows = not tested)  FUNCTIONAL TESTS:  5 times sit to stand: TBD Timed up and go (TUG): TBD  GAIT: Distance walked: In clinic distances Assistive device utilized: Environmental consultant - 2 wheeled Level of assistance: Modified independence Comments: Step to gait pattern, slow, cautious, antalgic on L.   TODAY'S TREATMENT:                                                                                                                              DATE: 11/02/22 Bike level 5 x 6 minutes Gait around the back building no rest today just a little slower coming up the hill 35# leg curls both legs Leg extension 10# eccentrics on the left with cues to hold TKE 40# resisted gait with 6" step ups 40# resisted gait backwards Airex balance beam side stepping and tandem walking On airex ball  toss Gait outside in grass, uneven surfaces and  negotiating curbs Passive stretch into extension  10/28/22 Nustep level 5 x 5 minutes Bike level 5 x 5 minutes Gait around the back building good speed, one longer rest break today was very short of breath Leg curls 35# 2x10 LEg extension 10# eccentrics left  Tmill push fwd and backward cues to extend the left knee Leg press 40# x10, no wieght working  Feet on ball bridges PROM left knee extension  10/26/22 Bike L 4 Nustep L 5 min Step up-down 6 inch 10 x with cuing needed 50# cable press down 2 sets 10 Mini squat with TKE black tband 2 sets 10 Knee ext 10# 10 x hold 3 sec, SL 5# 10 x hold 3 sec Leg curls 35# 2x10 with stretch into extension b/n sets and after LAQ 3 # 2 sets 10 with focus on TKE TKE sitting tband 2 sets 10 PROM ext   10/21/22 Nustep L 6 5 min LE only with focus on TKE Bike Full Rev 5 min L 4 LLE leg press 20# focus on TKE with cuing verb and tactile 10x then 10 x with green tband 10 x Leg press 20# calf raises 2 sets 10 Leg curls 35# 2x10 with stretch into extension b/n sets and after KNEE LLE  only extension 5# really focus on TKE left eccentrics PROM flex and ext, patellar mobs   10/19/22 Nustep level 5x5 minutes LE's only Bike full revs 5 minutes level 4 Gait outside around the back building one rest break when coming up the hill Leg curls 35# 2x10 with stretch into extension b/n sets and after Leg extension 5# really focus on TKE left eccentrics Passive knee extension stretch 3# SAQ cues for TKE  10/13/22 Progress note, recheck goals  NuStep L5 x42mins  Bike L3 x7mins  Calf stretch 30s Calf raises 2x10 Leg ext 2x10 10#, eccentric lowering L x10, 5# LLE only x10 HS curls 25# 2x10, 15# LLE x10 PROM flex/ext, patellar mobs STS holding yellow ball  2x10    10/07/22 Nustep level 5 x 5 mintues Bike level 4 x 5 minutes Left only leg curls 15# 3x10, stretch into extension b/n sets 5# leg  extension left only cues for TKE Tmill push fwd only Calf stretches On airex cone toe touches with and without cane Stairs step over step Fast walk with light HHA trying to get him to take bigger steps  On airex ball toss Asked him to do heel prop extension stretch over the weekend  10/05/22 Bike level 4 x 5 minutes Gait around the back building no device, slow, decreased knee extension, 1 rest brace Resisted gait all directions 40# leg curls left only 15# 3x10 with passive stretch b/n sets 5# leg extension left only small ROM focus on TKE Leg press 40# both legs x10, left only 20# 2x8 Passive stretch flexion and extension Vaso medium pressure 36 degrees  09/30/22 Nustep level 5 x 6 minutes Bike level 3 x 5 minutes Gait outside with a SPC around the parking island Leg curls 25# 2x10, 15# left only 2 x10 Leg extension 5# 2x10 Leg press 20# 2x10, then left only x10 then no weight x10 Passive stretch flexion and extension, scar mobilization Vaso medium pressure in elevation left knee 35 degrees F  09/28/22 Bike L 3 full revolutions Nustep L 5 5 min LE only HS curls both legs 25# 2x10 LLE only 10x 20# 5# LLE knee ext 3 sets 5 Black bar DF and PF 15 x  each 30# resisted gait backward 10 x for TKE Amb without AD working on NOT ER left foot and increased cadeance TKE long sitting with green tband 2 sets 10 PROM flex and ext with emphasis on ext SL leg press 20# focus on TKE 2 sets 10- seat #9 weakness with push off Fitter ext 2 sets 10    09/23/22 Nustep level 5 x 6 minutes Bike partial revs x 4 minutes HS curls both legs 25# 2x10 Leg press 20# 3x10 some cues to focus on the left TKE, left leg only no weight x 10 LAQ 3# 2x10 cues to get leg straight Gait SPC with CGA around the parking [employer] 2# cues for TKE left 2x10 Passive stretch into extension Vaso in elevation high pressure 36 degrees F  PATIENT EDUCATION:  Education details: POC, HEP Person educated:  Patient Education method: Explanation Education comprehension: verbalized understanding  HOME EXERCISE PROGRAM: [id number]  ASSESSMENT:  CLINICAL IMPRESSION:. Patient reports that he is having some low back pain, he is very guarded with his first few steps and tends to be stooped over.  He continues to have extension lag and turns the foot out.   OBJECTIVE IMPAIRMENTS: Abnormal gait, decreased activity tolerance, decreased balance, decreased coordination, difficulty walking, decreased ROM, decreased strength, impaired flexibility, and pain.   ACTIVITY LIMITATIONS: carrying, lifting, bending, sitting, standing, squatting, sleeping, stairs, and locomotion level  PARTICIPATION LIMITATIONS: meal prep, cleaning, laundry, driving, shopping, and community activity  PERSONAL FACTORS: Past/current experiences are also affecting patient's functional outcome.   REHAB POTENTIAL: Good  CLINICAL DECISION MAKING: Evolving/moderate complexity  EVALUATION COMPLEXITY: Moderate   GOALS: Goals reviewed with patient? Yes  SHORT TERM GOALS: Target date: 08/23/22 I with initial HEP Baseline: Goal status: 09/14/22 met  2.  Increase R knee ROM to 5-100 Baseline:  Goal status:  progressing 10/13/22   10/21/22  4-115 MET  3.  Increase R knee strength to 3+/5 Baseline:  Goal status: met 10/05/22  LONG TERM GOALS: Target date: 12/02/22  I with final HEP Baseline:  Goal status: progressing 10/28/22  2.  Increase L knee strength to 5/5 Baseline:  Goal status: MET 10/13/22  3.  Increase L knee ROM to 0-120 Baseline:  Goal status: progressing 10/13/22   4-115 progressing 11/02/22  4.  Patient will walk x at least 500' with LRAD, MI Baseline:  Goal status: progressing 09/30/22  MET 10/21/22  5.  Complete 5 x STS in < 12 seconds with equal WB through BLE Baseline:  Goal status: 09/14/22 progressing, 11.67s MET 10/13/22  6.  Complete TUG in < 12 sec with LRAD, MI Baseline:  Goal status: 09/14/22  progressing, 9.25s MET 10/13/22   PLAN:  PT FREQUENCY: 2x/week  PT DURATION: 12 weeks  PLANNED INTERVENTIONS: Therapeutic exercises, Therapeutic activity, Neuromuscular re-education, Balance training, Gait training, Patient/Family education, Self Care, Joint mobilization, Cryotherapy, Moist heat, scar mobilization, Taping, Vasopneumatic device, Ionotophoresis 4mg /ml Dexamethasone, and Manual therapy  PLAN FOR NEXT SESSION: continue to push his ROM try to get his extension back.   Jearld Lesch, PT 11/02/2022, 1:55 PM

## 2022-11-04 ENCOUNTER — Ambulatory Visit: Payer: Medicare PPO | Admitting: Physical Therapy

## 2022-11-04 DIAGNOSIS — Z79899 Other long term (current) drug therapy: Secondary | ICD-10-CM | POA: Diagnosis not present

## 2022-11-04 DIAGNOSIS — M069 Rheumatoid arthritis, unspecified: Secondary | ICD-10-CM | POA: Diagnosis not present

## 2022-11-04 DIAGNOSIS — M8949 Other hypertrophic osteoarthropathy, multiple sites: Secondary | ICD-10-CM | POA: Diagnosis not present

## 2022-11-04 DIAGNOSIS — M47816 Spondylosis without myelopathy or radiculopathy, lumbar region: Secondary | ICD-10-CM | POA: Diagnosis not present

## 2022-11-04 DIAGNOSIS — M7521 Bicipital tendinitis, right shoulder: Secondary | ICD-10-CM | POA: Diagnosis not present

## 2022-11-07 ENCOUNTER — Other Ambulatory Visit: Payer: Self-pay | Admitting: Physician Assistant

## 2022-11-09 ENCOUNTER — Ambulatory Visit: Payer: Medicare PPO | Admitting: Physical Therapy

## 2022-11-09 DIAGNOSIS — Z96652 Presence of left artificial knee joint: Secondary | ICD-10-CM

## 2022-11-09 DIAGNOSIS — M6281 Muscle weakness (generalized): Secondary | ICD-10-CM | POA: Diagnosis not present

## 2022-11-09 DIAGNOSIS — R262 Difficulty in walking, not elsewhere classified: Secondary | ICD-10-CM | POA: Diagnosis not present

## 2022-11-09 DIAGNOSIS — R600 Localized edema: Secondary | ICD-10-CM | POA: Diagnosis not present

## 2022-11-09 NOTE — Therapy (Signed)
OUTPATIENT PHYSICAL THERAPY LOWER EXTREMITY    Patient Name: Joshua Ruiz MRN: [id number] DOB:[birth year], 82 y.o., male Today's Date: 11/09/2022  END OF SESSION:  PT End of Session - 11/09/22 1358     Visit Number 16    Number of Visits 24    Date for PT Re-Evaluation 12/05/22    Authorization Type [employer] 14/24    PT Start Time 1355    PT Stop Time 1440    PT Time Calculation (min) 45 min              Past Medical History:  Diagnosis Date   Cancer (HCC)    Skin cancer left ear and nose   Complication of anesthesia    Sinus Infection after back surgery 11/2021   GERD (gastroesophageal reflux disease)    Rheumatoid arthritis (HCC)    Past Surgical History:  Procedure Laterality Date   COLONOSCOPY  2021   EYE SURGERY Bilateral    cataract extraction   HEMORRHOID SURGERY     LAMINECTOMY WITH POSTERIOR LATERAL ARTHRODESIS LEVEL 3 N/A 11/20/2021   Procedure: LUMBAR TWO THROUGH LUMBAR THREE, LUMBAR THREE THROUGH LUMBAR FOUR, LUMBAR FOUR THROUGH LUMBAR FIVE LAMINECTOMY WITH FACETECTOMY, POSTERIOR SEGMENTAL INSTRUMENTATION, POSTEROLATERAL ARTHRODESIS;  Surgeon: Lisbeth Renshaw, MD;  Location: [facility];  Service: Neurosurgery;  Laterality: N/A;  3C   LYMPH NODE BIOPSY     Neck   MOHS SURGERY Left 2016   Ear. [facility] in [place]   TOTAL KNEE ARTHROPLASTY Left 08/23/2022   Procedure: LEFT TOTAL KNEE REPLACEMENT;  Surgeon: Tarry Kos, MD;  Location: [facility] OR;  Service: Orthopedics;  Laterality: Left;   TRIGGER FINGER RELEASE Right    3rd   Patient Active Problem List   Diagnosis Date Noted   Numbness and tingling of fingertips 09/29/2022   Urinary retention 08/31/2022   Status post total left knee replacement 08/23/2022   Chronic rhinitis 02/15/2022   Eczema 02/15/2022   Spondylolisthesis of lumbar region 11/20/2021   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/2021   Lumbar spondylosis 10/20/2021   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-L5 level 10/20/2021    Primary osteoarthritis of left knee 08/14/2021   Chronic epididymitis 08/13/2020   Hives 07/11/2020   Rheumatoid arthritis (HCC) 07/11/2020   Cervical arthritis 07/11/2020   Gastroesophageal reflux disease 07/11/2020   Presbyopia of both eyes 04/20/2015   Pseudophakia of both eyes 04/20/2015    PCP: Loyola Mast, MD  REFERRING PROVIDER: Tarry Kos, MD  REFERRING DIAG:  Diagnosis  M17.12 (ICD-10-CM) - Primary osteoarthritis of left knee    THERAPY DIAG:  S/P TKR (total knee replacement), left  Muscle weakness (generalized)  Difficulty in walking, not elsewhere classified  Rationale for Evaluation and Treatment: Rehabilitation  ONSET DATE: 08/23/22  SUBJECTIVE:   SUBJECTIVE STATEMENT:  doing good  PERTINENT HISTORY: RA, GERD, back surgery,  LTKR 08/23/22 ER visit 08/30/22 due to urinary retention PAIN:  Are you having pain? Yes: NPRS scale: 2/10 Pain location: L knee Pain description: Muscle spasms Aggravating factors: fatigue Relieving factors: ice  PRECAUTIONS: Knee and Fall  RED FLAGS: None   WEIGHT BEARING RESTRICTIONS: Yes LLE WBAT  FALLS:  Has patient fallen in last 6 months? No  LIVING ENVIRONMENT: Lives with: lives with their family and lives alone Lives in: House/apartment Stairs: No Has following equipment at home: Dan Humphreys - 2 wheeled, shower chair, and bed side commode  OCCUPATION: Retired, works out, likes to walk  PLOF: Independent  PATIENT GOALS: Return to his walking  OUTPATIENT PHYSICAL THERAPY LOWER EXTREMITY    Patient Name: Joshua Ruiz MRN: [id number] DOB:[birth year], 82 y.o., male Today's Date: 11/09/2022  END OF SESSION:  PT End of Session - 11/09/22 1358     Visit Number 16    Number of Visits 24    Date for PT Re-Evaluation 12/05/22    Authorization Type [employer] 14/24    PT Start Time 1355    PT Stop Time 1440    PT Time Calculation (min) 45 min              Past Medical History:  Diagnosis Date   Cancer (HCC)    Skin cancer left ear and nose   Complication of anesthesia    Sinus Infection after back surgery 11/2021   GERD (gastroesophageal reflux disease)    Rheumatoid arthritis (HCC)    Past Surgical History:  Procedure Laterality Date   COLONOSCOPY  2021   EYE SURGERY Bilateral    cataract extraction   HEMORRHOID SURGERY     LAMINECTOMY WITH POSTERIOR LATERAL ARTHRODESIS LEVEL 3 N/A 11/20/2021   Procedure: LUMBAR TWO THROUGH LUMBAR THREE, LUMBAR THREE THROUGH LUMBAR FOUR, LUMBAR FOUR THROUGH LUMBAR FIVE LAMINECTOMY WITH FACETECTOMY, POSTERIOR SEGMENTAL INSTRUMENTATION, POSTEROLATERAL ARTHRODESIS;  Surgeon: Lisbeth Renshaw, MD;  Location: [facility];  Service: Neurosurgery;  Laterality: N/A;  3C   LYMPH NODE BIOPSY     Neck   MOHS SURGERY Left 2016   Ear. [facility] in [place]   TOTAL KNEE ARTHROPLASTY Left 08/23/2022   Procedure: LEFT TOTAL KNEE REPLACEMENT;  Surgeon: Tarry Kos, MD;  Location: [facility];  Service: Orthopedics;  Laterality: Left;   TRIGGER FINGER RELEASE Right    3rd   Patient Active Problem List   Diagnosis Date Noted   Numbness and tingling of fingertips 09/29/2022   Urinary retention 08/31/2022   Status post total left knee replacement 08/23/2022   Chronic rhinitis 02/15/2022   Eczema 02/15/2022   Spondylolisthesis of lumbar region 11/20/2021   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/2021   Lumbar spondylosis 10/20/2021   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-L5 level 10/20/2021    Primary osteoarthritis of left knee 08/14/2021   Chronic epididymitis 08/13/2020   Hives 07/11/2020   Rheumatoid arthritis (HCC) 07/11/2020   Cervical arthritis 07/11/2020   Gastroesophageal reflux disease 07/11/2020   Presbyopia of both eyes 04/20/2015   Pseudophakia of both eyes 04/20/2015    PCP: Loyola Mast, MD  REFERRING PROVIDER: Tarry Kos, MD  REFERRING DIAG:  Diagnosis  M17.12 (ICD-10-CM) - Primary osteoarthritis of left knee    THERAPY DIAG:  S/P TKR (total knee replacement), left  Muscle weakness (generalized)  Difficulty in walking, not elsewhere classified  Rationale for Evaluation and Treatment: Rehabilitation  ONSET DATE: 08/23/22  SUBJECTIVE:   SUBJECTIVE STATEMENT:  doing good  PERTINENT HISTORY: RA, GERD, back surgery,  LTKR 08/23/22 ER visit 08/30/22 due to urinary retention PAIN:  Are you having pain? Yes: NPRS scale: 2/10 Pain location: L knee Pain description: Muscle spasms Aggravating factors: fatigue Relieving factors: ice  PRECAUTIONS: Knee and Fall  RED FLAGS: None   WEIGHT BEARING RESTRICTIONS: Yes LLE WBAT  FALLS:  Has patient fallen in last 6 months? No  LIVING ENVIRONMENT: Lives with: lives with their family and lives alone Lives in: House/apartment Stairs: No Has following equipment at home: Dan Humphreys - 2 wheeled, shower chair, and bed side commode  OCCUPATION: Retired, works out, likes to walk  PLOF: Independent  PATIENT GOALS: Return to his walking  OUTPATIENT PHYSICAL THERAPY LOWER EXTREMITY    Patient Name: Joshua Ruiz MRN: [id number] DOB:[birth year], 82 y.o., male Today's Date: 11/09/2022  END OF SESSION:  PT End of Session - 11/09/22 1358     Visit Number 16    Number of Visits 24    Date for PT Re-Evaluation 12/05/22    Authorization Type [employer] 14/24    PT Start Time 1355    PT Stop Time 1440    PT Time Calculation (min) 45 min              Past Medical History:  Diagnosis Date   Cancer (HCC)    Skin cancer left ear and nose   Complication of anesthesia    Sinus Infection after back surgery 11/2021   GERD (gastroesophageal reflux disease)    Rheumatoid arthritis (HCC)    Past Surgical History:  Procedure Laterality Date   COLONOSCOPY  2021   EYE SURGERY Bilateral    cataract extraction   HEMORRHOID SURGERY     LAMINECTOMY WITH POSTERIOR LATERAL ARTHRODESIS LEVEL 3 N/A 11/20/2021   Procedure: LUMBAR TWO THROUGH LUMBAR THREE, LUMBAR THREE THROUGH LUMBAR FOUR, LUMBAR FOUR THROUGH LUMBAR FIVE LAMINECTOMY WITH FACETECTOMY, POSTERIOR SEGMENTAL INSTRUMENTATION, POSTEROLATERAL ARTHRODESIS;  Surgeon: Lisbeth Renshaw, MD;  Location: [facility];  Service: Neurosurgery;  Laterality: N/A;  3C   LYMPH NODE BIOPSY     Neck   MOHS SURGERY Left 2016   Ear. [facility] in [place]   TOTAL KNEE ARTHROPLASTY Left 08/23/2022   Procedure: LEFT TOTAL KNEE REPLACEMENT;  Surgeon: Tarry Kos, MD;  Location: [facility];  Service: Orthopedics;  Laterality: Left;   TRIGGER FINGER RELEASE Right    3rd   Patient Active Problem List   Diagnosis Date Noted   Numbness and tingling of fingertips 09/29/2022   Urinary retention 08/31/2022   Status post total left knee replacement 08/23/2022   Chronic rhinitis 02/15/2022   Eczema 02/15/2022   Spondylolisthesis of lumbar region 11/20/2021   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/2021   Lumbar spondylosis 10/20/2021   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-L5 level 10/20/2021    Primary osteoarthritis of left knee 08/14/2021   Chronic epididymitis 08/13/2020   Hives 07/11/2020   Rheumatoid arthritis (HCC) 07/11/2020   Cervical arthritis 07/11/2020   Gastroesophageal reflux disease 07/11/2020   Presbyopia of both eyes 04/20/2015   Pseudophakia of both eyes 04/20/2015    PCP: Loyola Mast, MD  REFERRING PROVIDER: Tarry Kos, MD  REFERRING DIAG:  Diagnosis  M17.12 (ICD-10-CM) - Primary osteoarthritis of left knee    THERAPY DIAG:  S/P TKR (total knee replacement), left  Muscle weakness (generalized)  Difficulty in walking, not elsewhere classified  Rationale for Evaluation and Treatment: Rehabilitation  ONSET DATE: 08/23/22  SUBJECTIVE:   SUBJECTIVE STATEMENT:  doing good  PERTINENT HISTORY: RA, GERD, back surgery,  LTKR 08/23/22 ER visit 08/30/22 due to urinary retention PAIN:  Are you having pain? Yes: NPRS scale: 2/10 Pain location: L knee Pain description: Muscle spasms Aggravating factors: fatigue Relieving factors: ice  PRECAUTIONS: Knee and Fall  RED FLAGS: None   WEIGHT BEARING RESTRICTIONS: Yes LLE WBAT  FALLS:  Has patient fallen in last 6 months? No  LIVING ENVIRONMENT: Lives with: lives with their family and lives alone Lives in: House/apartment Stairs: No Has following equipment at home: Dan Humphreys - 2 wheeled, shower chair, and bed side commode  OCCUPATION: Retired, works out, likes to walk  PLOF: Independent  PATIENT GOALS: Return to his walking  Joshua Ruiz MRN: [id number] Date of Birth: October 27, 1940 Referring Provider:  Tarry Kos, MD  Encounter Date: 11/09/2022   Suanne Marker, PTA 11/09/2022, 1:58 PM  [facility] [address] [place], [place], [address] Phone: [phone]   Fax:  [phone]  OUTPATIENT PHYSICAL THERAPY LOWER EXTREMITY    Patient Name: Joshua Ruiz MRN: [id number] DOB:[birth year], 82 y.o., male Today's Date: 11/09/2022  END OF SESSION:  PT End of Session - 11/09/22 1358     Visit Number 16    Number of Visits 24    Date for PT Re-Evaluation 12/05/22    Authorization Type [employer] 14/24    PT Start Time 1355    PT Stop Time 1440    PT Time Calculation (min) 45 min              Past Medical History:  Diagnosis Date   Cancer (HCC)    Skin cancer left ear and nose   Complication of anesthesia    Sinus Infection after back surgery 11/2021   GERD (gastroesophageal reflux disease)    Rheumatoid arthritis (HCC)    Past Surgical History:  Procedure Laterality Date   COLONOSCOPY  2021   EYE SURGERY Bilateral    cataract extraction   HEMORRHOID SURGERY     LAMINECTOMY WITH POSTERIOR LATERAL ARTHRODESIS LEVEL 3 N/A 11/20/2021   Procedure: LUMBAR TWO THROUGH LUMBAR THREE, LUMBAR THREE THROUGH LUMBAR FOUR, LUMBAR FOUR THROUGH LUMBAR FIVE LAMINECTOMY WITH FACETECTOMY, POSTERIOR SEGMENTAL INSTRUMENTATION, POSTEROLATERAL ARTHRODESIS;  Surgeon: Lisbeth Renshaw, MD;  Location: [facility];  Service: Neurosurgery;  Laterality: N/A;  3C   LYMPH NODE BIOPSY     Neck   MOHS SURGERY Left 2016   Ear. [facility] in [place]   TOTAL KNEE ARTHROPLASTY Left 08/23/2022   Procedure: LEFT TOTAL KNEE REPLACEMENT;  Surgeon: Tarry Kos, MD;  Location: [facility];  Service: Orthopedics;  Laterality: Left;   TRIGGER FINGER RELEASE Right    3rd   Patient Active Problem List   Diagnosis Date Noted   Numbness and tingling of fingertips 09/29/2022   Urinary retention 08/31/2022   Status post total left knee replacement 08/23/2022   Chronic rhinitis 02/15/2022   Eczema 02/15/2022   Spondylolisthesis of lumbar region 11/20/2021   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/2021   Lumbar spondylosis 10/20/2021   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-L5 level 10/20/2021    Primary osteoarthritis of left knee 08/14/2021   Chronic epididymitis 08/13/2020   Hives 07/11/2020   Rheumatoid arthritis (HCC) 07/11/2020   Cervical arthritis 07/11/2020   Gastroesophageal reflux disease 07/11/2020   Presbyopia of both eyes 04/20/2015   Pseudophakia of both eyes 04/20/2015    PCP: Loyola Mast, MD  REFERRING PROVIDER: Tarry Kos, MD  REFERRING DIAG:  Diagnosis  M17.12 (ICD-10-CM) - Primary osteoarthritis of left knee    THERAPY DIAG:  S/P TKR (total knee replacement), left  Muscle weakness (generalized)  Difficulty in walking, not elsewhere classified  Rationale for Evaluation and Treatment: Rehabilitation  ONSET DATE: 08/23/22  SUBJECTIVE:   SUBJECTIVE STATEMENT:  doing good  PERTINENT HISTORY: RA, GERD, back surgery,  LTKR 08/23/22 ER visit 08/30/22 due to urinary retention PAIN:  Are you having pain? Yes: NPRS scale: 2/10 Pain location: L knee Pain description: Muscle spasms Aggravating factors: fatigue Relieving factors: ice  PRECAUTIONS: Knee and Fall  RED FLAGS: None   WEIGHT BEARING RESTRICTIONS: Yes LLE WBAT  FALLS:  Has patient fallen in last 6 months? No  LIVING ENVIRONMENT: Lives with: lives with their family and lives alone Lives in: House/apartment Stairs: No Has following equipment at home: Dan Humphreys - 2 wheeled, shower chair, and bed side commode  OCCUPATION: Retired, works out, likes to walk  PLOF: Independent  PATIENT GOALS: Return to his walking

## 2022-11-10 ENCOUNTER — Telehealth: Payer: Self-pay | Admitting: Orthopaedic Surgery

## 2022-11-10 ENCOUNTER — Other Ambulatory Visit: Payer: Self-pay

## 2022-11-10 MED ORDER — AMOXICILLIN 500 MG PO TABS: ORAL_TABLET | ORAL | 2 refills | Status: DC

## 2022-11-10 NOTE — Telephone Encounter (Signed)
Called and notified Joshua Ruiz that he needs to be premedicated for 2 years after joint replacement.  Also sent in Amoxicillin to pharmacy.

## 2022-11-10 NOTE — Telephone Encounter (Signed)
Left TKA 08/23/2022

## 2022-11-10 NOTE — Telephone Encounter (Signed)
Received call from [facility] with [facility] advised patient has a dental appointment 11/17/2022. Brooke asked if patient will need an antibiotic. Patient had surgery in August.   The number to contact Nehemiah Settle is [phone]

## 2022-11-11 ENCOUNTER — Ambulatory Visit: Payer: Medicare PPO | Admitting: Physical Therapy

## 2022-11-11 ENCOUNTER — Encounter: Payer: Self-pay | Admitting: Physical Therapy

## 2022-11-11 DIAGNOSIS — R600 Localized edema: Secondary | ICD-10-CM

## 2022-11-11 DIAGNOSIS — R262 Difficulty in walking, not elsewhere classified: Secondary | ICD-10-CM

## 2022-11-11 DIAGNOSIS — M6281 Muscle weakness (generalized): Secondary | ICD-10-CM

## 2022-11-11 DIAGNOSIS — Z96652 Presence of left artificial knee joint: Secondary | ICD-10-CM

## 2022-11-11 NOTE — Therapy (Signed)
OUTPATIENT PHYSICAL THERAPY LOWER EXTREMITY    Patient Name: Joshua Ruiz MRN: [id number] DOB:20-Jul-1940, 82 y.o., male Today's Date: 11/11/2022  END OF SESSION:  PT End of Session - 11/11/22 1354     Visit Number 17    Number of Visits 24    Date for PT Re-Evaluation 12/05/22    Authorization Type [employer] 16/24    PT Start Time 1354    PT Stop Time 1442    PT Time Calculation (min) 48 min    Activity Tolerance Patient tolerated treatment well    Behavior During Therapy [facility] for tasks assessed/performed              Past Medical History:  Diagnosis Date   Cancer (HCC)    Skin cancer left ear and nose   Complication of anesthesia    Sinus Infection after back surgery 11/2021   GERD (gastroesophageal reflux disease)    Rheumatoid arthritis (HCC)    Past Surgical History:  Procedure Laterality Date   COLONOSCOPY  2021   EYE SURGERY Bilateral    cataract extraction   HEMORRHOID SURGERY     LAMINECTOMY WITH POSTERIOR LATERAL ARTHRODESIS LEVEL 3 N/A 11/20/2021   Procedure: LUMBAR TWO THROUGH LUMBAR THREE, LUMBAR THREE THROUGH LUMBAR FOUR, LUMBAR FOUR THROUGH LUMBAR FIVE LAMINECTOMY WITH FACETECTOMY, POSTERIOR SEGMENTAL INSTRUMENTATION, POSTEROLATERAL ARTHRODESIS;  Surgeon: Lisbeth Renshaw, MD;  Location: [facility] OR;  Service: Neurosurgery;  Laterality: N/A;  3C   LYMPH NODE BIOPSY     Neck   MOHS SURGERY Left 2016   Ear. [facility] in [place]   TOTAL KNEE ARTHROPLASTY Left 08/23/2022   Procedure: LEFT TOTAL KNEE REPLACEMENT;  Surgeon: Tarry Kos, MD;  Location: [facility] OR;  Service: Orthopedics;  Laterality: Left;   TRIGGER FINGER RELEASE Right    3rd   Patient Active Problem List   Diagnosis Date Noted   Numbness and tingling of fingertips 09/29/2022   Urinary retention 08/31/2022   Status post total left knee replacement 08/23/2022   Chronic rhinitis 02/15/2022   Eczema 02/15/2022   Spondylolisthesis of lumbar region 11/20/2021   Lumbar stenosis with  neurogenic claudication 10/20/2021   Lumbar spondylosis 10/20/2021   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-L5 level 10/20/2021   Primary osteoarthritis of left knee 08/14/2021   Chronic epididymitis 08/13/2020   Hives 07/11/2020   Rheumatoid arthritis (HCC) 07/11/2020   Cervical arthritis 07/11/2020   Gastroesophageal reflux disease 07/11/2020   Presbyopia of both eyes 04/20/2015   Pseudophakia of both eyes 04/20/2015    PCP: Loyola Mast, MD  REFERRING PROVIDER: Tarry Kos, MD  REFERRING DIAG:  Diagnosis  M17.12 (ICD-10-CM) - Primary osteoarthritis of left knee    THERAPY DIAG:  S/P TKR (total knee replacement), left  Muscle weakness (generalized)  Difficulty in walking, not elsewhere classified  Localized edema  Rationale for Evaluation and Treatment: Rehabilitation  ONSET DATE: 08/23/22  SUBJECTIVE:   SUBJECTIVE STATEMENT:  I feel stiff today, I have been busy this AM  PERTINENT HISTORY: RA, GERD, back surgery,  LTKR 08/23/22 ER visit 08/30/22 due to urinary retention PAIN:  Are you having pain? Yes: NPRS scale: 2/10 Pain location: L knee Pain description: Muscle spasms Aggravating factors: fatigue Relieving factors: ice  PRECAUTIONS: Knee and Fall  RED FLAGS: None   WEIGHT BEARING RESTRICTIONS: Yes LLE WBAT  FALLS:  Has patient fallen in last 6 months? No  LIVING ENVIRONMENT: Lives with: lives with their family and lives alone Lives in: House/apartment Stairs: No Has following equipment  at home: Dan Humphreys - 2 wheeled, shower chair, and bed side commode  OCCUPATION: Retired, works out, likes to walk  PLOF: Independent  PATIENT GOALS: Return to his walking routine, less pain, recover his strength  NEXT MD VISIT: 4 weeks  OBJECTIVE:   DIAGNOSTIC FINDINGS:  Knee X ray /12/24 FINDINGS: Two-view show total knee arthroplasty. Components appear well positioned. No unexpected finding. Air and fluid in the operative region as expected.    IMPRESSION: Good appearance following total knee arthroplasty.  COGNITION: Overall cognitive status: Within functional limits for tasks assessed     SENSATION: WFL  EDEMA:  Mod swelling in knee and lower leg, red and warm  POSTURE: rounded shoulders, forward head, and weight shift right  PALPATION: L knee tight and TTP with mild redness and swelling.  LOWER EXTREMITY ROM:  Passive ROM Right eval Left eval Left AROM 09/30/22 L AROM 10/13/22 Left PROM 10/28/22 Left AROM 11/11/22  Hip flexion        Hip extension        Hip abduction        Hip adduction        Hip internal rotation        Hip external rotation        Knee flexion  101 114 110  116  Knee extension  -20 12 8  0 P! 10  Ankle dorsiflexion        Ankle plantarflexion        Ankle inversion        Ankle eversion         (Blank rows = not tested)  LOWER EXTREMITY MMT: RLE 5/5  MMT Right eval Left eval Left  10/13/22  Hip flexion  3+ 5  Hip extension     Hip abduction     Hip adduction     Hip internal rotation     Hip external rotation  3/5   Knee flexion  3+ 5  Knee extension  3- 5  Ankle dorsiflexion     Ankle plantarflexion     Ankle inversion     Ankle eversion      (Blank rows = not tested)  FUNCTIONAL TESTS:  5 times sit to stand: TBD Timed up and go (TUG):   11/11/22 12 seconds  GAIT: Distance walked: In clinic distances Assistive device utilized: Environmental consultant - 2 wheeled Level of assistance: Modified independence Comments: Step to gait pattern, slow, cautious, antalgic on L.   TODAY'S TREATMENT:                                                                                                                              DATE: 11/11/22 Bike level 4 x 5  minutes Gait around the back building one rest Sit to stand with weighted ball Leg extension 5# 2x10 verbal and tactile cues for TKE, one round of eccentrics 20# left only HS curls 2.5# SAQ cues fot quad Passive stretch  focus on  extension TUG 12 seconds  11/09/22 Nustep L 5 6 min Gait around the back building no rest today just a little slower coming up the hill and missing some heel strike and TKE on left  Gait outside in grass, uneven surfaces and negotiating curbs 35# leg curls both legs Leg extension 10# eccentrics on the left with cues to hold TKE 40# resisted gait with 6" step ups 40# resisted gait backwards TKE green tband 2 sets 10 Pass ext stretching  11/02/22 Bike level 5 x 6 minutes Gait around the back building no rest today just a little slower coming up the hill 35# leg curls both legs Leg extension 10# eccentrics on the left with cues to hold TKE 40# resisted gait with 6" step ups 40# resisted gait backwards Airex balance beam side stepping and tandem walking On airex ball toss Gait outside in grass, uneven surfaces and negotiating curbs Passive stretch into extension  10/28/22 Nustep level 5 x 5 minutes Bike level 5 x 5 minutes Gait around the back building good speed, one longer rest break today was very short of breath Leg curls 35# 2x10 LEg extension 10# eccentrics left  Tmill push fwd and backward cues to extend the left knee Leg press 40# x10, no wieght working  Feet on ball bridges PROM left knee extension  10/26/22 Bike L 4 Nustep L 5 min Step up-down 6 inch 10 x with cuing needed 50# cable press down 2 sets 10 Mini squat with TKE black tband 2 sets 10 Knee ext 10# 10 x hold 3 sec, SL 5# 10 x hold 3 sec Leg curls 35# 2x10 with stretch into extension b/n sets and after LAQ 3 # 2 sets 10 with focus on TKE TKE sitting tband 2 sets 10 PROM ext   10/21/22 Nustep L 6 5 min LE only with focus on TKE Bike Full Rev 5 min L 4 LLE leg press 20# focus on TKE with cuing verb and tactile 10x then 10 x with green tband 10 x Leg press 20# calf raises 2 sets 10 Leg curls 35# 2x10 with stretch into extension b/n sets and after KNEE LLE  only extension 5# really focus on TKE  left eccentrics PROM flex and ext, patellar mobs   10/19/22 Nustep level 5x5 minutes LE's only Bike full revs 5 minutes level 4 Gait outside around the back building one rest break when coming up the hill Leg curls 35# 2x10 with stretch into extension b/n sets and after Leg extension 5# really focus on TKE left eccentrics Passive knee extension stretch 3# SAQ cues for TKE  10/13/22 Progress note, recheck goals  NuStep L5 x50mins  Bike L3 x29mins  Calf stretch 30s Calf raises 2x10 Leg ext 2x10 10#, eccentric lowering L x10, 5# LLE only x10 HS curls 25# 2x10, 15# LLE x10 PROM flex/ext, patellar mobs STS holding yellow ball  2x10    10/07/22 Nustep level 5 x 5 mintues Bike level 4 x 5 minutes Left only leg curls 15# 3x10, stretch into extension b/n sets 5# leg extension left only cues for TKE Tmill push fwd only Calf stretches On airex cone toe touches with and without cane Stairs step over step Fast walk with light HHA trying to get him to take bigger steps  On airex ball toss Asked him to do heel prop extension stretch over the weekend   PATIENT EDUCATION:  Education details: POC, HEP Person educated: Patient Education method: Explanation  Education comprehension: verbalized understanding  HOME EXERCISE PROGRAM: [id number]  ASSESSMENT:  CLINICAL IMPRESSION:.  PAtient overall is doing well, reports able to do stairs step over step up and down, he reports and demonstrates a decrease in endurance and the ability to walk for any distance.  He continues to have a quad lag, with ambulation he turns the foot out and does not get full knee extension, we discussed this and have been working on this to try to help the gait as I worry this could cause back issues in the future.    OBJECTIVE IMPAIRMENTS: Abnormal gait, decreased activity tolerance, decreased balance, decreased coordination, difficulty walking, decreased ROM, decreased strength, impaired flexibility, and pain.    ACTIVITY LIMITATIONS: carrying, lifting, bending, sitting, standing, squatting, sleeping, stairs, and locomotion level  PARTICIPATION LIMITATIONS: meal prep, cleaning, laundry, driving, shopping, and community activity  PERSONAL FACTORS: Past/current experiences are also affecting patient's functional outcome.   REHAB POTENTIAL: Good  CLINICAL DECISION MAKING: Evolving/moderate complexity  EVALUATION COMPLEXITY: Moderate   GOALS: Goals reviewed with patient? Yes  SHORT TERM GOALS: Target date: 08/23/22 I with initial HEP Baseline: Goal status: 09/14/22 met  2.  Increase R knee ROM to 5-100 Baseline:  Goal status:  progressing 10/13/22   10/21/22  4-115 MET  3.  Increase R knee strength to 3+/5 Baseline:  Goal status: met 10/05/22  LONG TERM GOALS: Target date: 12/02/22  I with final HEP Baseline:  Goal status: progressing 11/11/22  2.  Increase L knee strength to 5/5 Baseline:  Goal status: MET 10/13/22  3.  Increase L knee ROM to 0-120 Baseline:  Goal status: progressing 10/13/22   11/11/22 AROM 12-116 degrees  4.  Patient will walk x at least 500' with LRAD, MI Baseline:  Goal status: progressing 09/30/22  MET 10/21/22  5.  Complete 5 x STS in < 12 seconds with equal WB through BLE Baseline:  Goal status: 09/14/22 progressing, 11.67s MET 10/13/22  6.  Complete TUG in < 12 sec with LRAD, MI Baseline:  Goal status: 09/14/22 progressing, 9.25s MET 10/13/22   PLAN:  PT FREQUENCY: 2x/week  PT DURATION: 12 weeks  PLANNED INTERVENTIONS: Therapeutic exercises, Therapeutic activity, Neuromuscular re-education, Balance training, Gait training, Patient/Family education, Self Care, Joint mobilization, Cryotherapy, Moist heat, scar mobilization, Taping, Vasopneumatic device, Ionotophoresis 4mg /ml Dexamethasone, and Manual therapy  PLAN FOR NEXT SESSION: continue to push his ROM try to get his extension back.   Jearld Lesch, PT 11/11/2022, 1:55 PM

## 2022-11-16 ENCOUNTER — Encounter: Payer: Medicare PPO | Admitting: Physician Assistant

## 2022-11-16 ENCOUNTER — Telehealth: Payer: Self-pay | Admitting: *Deleted

## 2022-11-16 ENCOUNTER — Ambulatory Visit (INDEPENDENT_AMBULATORY_CARE_PROVIDER_SITE_OTHER): Payer: Medicare PPO | Admitting: Physician Assistant

## 2022-11-16 ENCOUNTER — Encounter: Payer: Self-pay | Admitting: Physician Assistant

## 2022-11-16 DIAGNOSIS — Z96651 Presence of right artificial knee joint: Secondary | ICD-10-CM | POA: Diagnosis not present

## 2022-11-16 NOTE — Telephone Encounter (Signed)
Ortho bundle 90 day in office visit completed. 

## 2022-11-16 NOTE — Progress Notes (Signed)
Post-Op Visit Note   Patient: Joshua Ruiz           Date of Birth: 08/09/1940           MRN: [id number] Visit Date: 11/16/2022 PCP: Loyola Mast, MD   Assessment & Plan:  Chief Complaint:  Chief Complaint  Patient presents with   Left Knee - Follow-up    08/23/2022 LT TKA   Visit Diagnoses:  1. H/O total knee replacement, right     Plan: Patient is a pleasant 82 year old gentleman who comes in today 3 months status post left total knee replacement 08/23/2022.  He has been doing well.  No pain.  He has been in physical therapy continuing to work on range of motion and strength.  Examination of his left knee reveals a fully healed surgical scar without complication.  Range of motion: 10 degrees of extension to approximately 115 degrees of flexion.  He is stable to valgus and varus stress.  He is neurovascularly intact distally.  At this point, his extension lag does not seem to be bothersome at all.  He does note that his foot is rotated but this was rotated prior to surgery and is not bothersome either.  He will continue to work on regaining the rest of his extension.  He will continue to advance with activity as tolerated.  Dental prophylaxis reinforced.  He will follow-up in 3 months for repeat evaluation and 2 view x-rays of the right knee.  Call with concerns or questions in the meantime.  Follow-Up Instructions: Return in about 3 months (around 02/16/2023).   Orders:  No orders of the defined types were placed in this encounter.  No orders of the defined types were placed in this encounter.   Imaging:   PMFS History: Patient Active Problem List   Diagnosis Date Noted   Numbness and tingling of fingertips 09/29/2022   Urinary retention 08/31/2022   Status post total left knee replacement 08/23/2022   Chronic rhinitis 02/15/2022   Eczema 02/15/2022   Spondylolisthesis of lumbar region 11/20/2021   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/2021   Lumbar  spondylosis 10/20/2021   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-L5 level 10/20/2021   Primary osteoarthritis of left knee 08/14/2021   Chronic epididymitis 08/13/2020   Hives 07/11/2020   Rheumatoid arthritis (HCC) 07/11/2020   Cervical arthritis 07/11/2020   Gastroesophageal reflux disease 07/11/2020   Presbyopia of both eyes 04/20/2015   Pseudophakia of both eyes 04/20/2015   Past Medical History:  Diagnosis Date   Cancer (HCC)    Skin cancer left ear and nose   Complication of anesthesia    Sinus Infection after back surgery 11/2021   GERD (gastroesophageal reflux disease)    Rheumatoid arthritis (HCC)     Family History  Problem Relation Age of Onset   Rheum arthritis Mother    Diabetes Mother    Cancer Sister        Lung   Cancer Brother        Lung    Past Surgical History:  Procedure Laterality Date   COLONOSCOPY  2021   EYE SURGERY Bilateral    cataract extraction   HEMORRHOID SURGERY     LAMINECTOMY WITH POSTERIOR LATERAL ARTHRODESIS LEVEL 3 N/A 11/20/2021   Procedure: LUMBAR TWO THROUGH LUMBAR THREE, LUMBAR THREE THROUGH LUMBAR FOUR, LUMBAR FOUR THROUGH LUMBAR FIVE LAMINECTOMY WITH FACETECTOMY, POSTERIOR SEGMENTAL INSTRUMENTATION, POSTEROLATERAL ARTHRODESIS;  Surgeon: Lisbeth Renshaw, MD;  Location: [facility] OR;  Service: Neurosurgery;  Laterality: N/A;  3C   LYMPH NODE BIOPSY     Neck   MOHS SURGERY Left 2016   Ear. [facility] in [place]   TOTAL KNEE ARTHROPLASTY Left 08/23/2022   Procedure: LEFT TOTAL KNEE REPLACEMENT;  Surgeon: Tarry Kos, MD;  Location: [facility] OR;  Service: Orthopedics;  Laterality: Left;   TRIGGER FINGER RELEASE Right    3rd   Social History   Occupational History   Not on file  Tobacco Use   Smoking status: Never    Passive exposure: Never   Smokeless tobacco: Never  Vaping Use   Vaping status: Never Used  Substance and Sexual Activity   Alcohol use: Not Currently    Comment: none   Drug use: Never   Sexual activity: Yes

## 2022-11-18 ENCOUNTER — Ambulatory Visit: Payer: Medicare PPO | Attending: Orthopaedic Surgery | Admitting: Physical Therapy

## 2022-11-18 DIAGNOSIS — M6281 Muscle weakness (generalized): Secondary | ICD-10-CM | POA: Insufficient documentation

## 2022-11-18 DIAGNOSIS — Z96652 Presence of left artificial knee joint: Secondary | ICD-10-CM | POA: Insufficient documentation

## 2022-11-18 DIAGNOSIS — M5459 Other low back pain: Secondary | ICD-10-CM | POA: Diagnosis not present

## 2022-11-18 DIAGNOSIS — R293 Abnormal posture: Secondary | ICD-10-CM | POA: Insufficient documentation

## 2022-11-18 DIAGNOSIS — R262 Difficulty in walking, not elsewhere classified: Secondary | ICD-10-CM | POA: Diagnosis not present

## 2022-11-18 DIAGNOSIS — R600 Localized edema: Secondary | ICD-10-CM | POA: Insufficient documentation

## 2022-11-18 NOTE — Therapy (Signed)
OUTPATIENT PHYSICAL THERAPY LOWER EXTREMITY    Patient Name: Joshua Ruiz MRN: [id number] DOB:December 22, 1940, 82 y.o., male Today's Date: 11/18/2022  END OF SESSION:  PT End of Session - 11/18/22 1351     Visit Number 18    Number of Visits 24    Date for PT Re-Evaluation 12/05/22    Authorization Type [employer] 16/24    PT Start Time 1351    PT Stop Time 1430    PT Time Calculation (min) 39 min    Activity Tolerance Patient tolerated treatment well    Behavior During Therapy WFL for tasks assessed/performed               Past Medical History:  Diagnosis Date   Cancer (HCC)    Skin cancer left ear and nose   Complication of anesthesia    Sinus Infection after back surgery 11/2021   GERD (gastroesophageal reflux disease)    Rheumatoid arthritis (HCC)    Past Surgical History:  Procedure Laterality Date   COLONOSCOPY  2021   EYE SURGERY Bilateral    cataract extraction   HEMORRHOID SURGERY     LAMINECTOMY WITH POSTERIOR LATERAL ARTHRODESIS LEVEL 3 N/A 11/20/2021   Procedure: LUMBAR TWO THROUGH LUMBAR THREE, LUMBAR THREE THROUGH LUMBAR FOUR, LUMBAR FOUR THROUGH LUMBAR FIVE LAMINECTOMY WITH FACETECTOMY, POSTERIOR SEGMENTAL INSTRUMENTATION, POSTEROLATERAL ARTHRODESIS;  Surgeon: Lisbeth Renshaw, MD;  Location: [facility] OR;  Service: Neurosurgery;  Laterality: N/A;  3C   LYMPH NODE BIOPSY     Neck   MOHS SURGERY Left 2016   Ear. [facility] in [place]   TOTAL KNEE ARTHROPLASTY Left 08/23/2022   Procedure: LEFT TOTAL KNEE REPLACEMENT;  Surgeon: Tarry Kos, MD;  Location: [facility] OR;  Service: Orthopedics;  Laterality: Left;   TRIGGER FINGER RELEASE Right    3rd   Patient Active Problem List   Diagnosis Date Noted   Numbness and tingling of fingertips 09/29/2022   Urinary retention 08/31/2022   Status post total left knee replacement 08/23/2022   Chronic rhinitis 02/15/2022   Eczema 02/15/2022   Spondylolisthesis of lumbar region 11/20/2021   Lumbar stenosis with  neurogenic claudication 10/20/2021   Lumbar spondylosis 10/20/2021   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-L5 level 10/20/2021   Primary osteoarthritis of left knee 08/14/2021   Chronic epididymitis 08/13/2020   Hives 07/11/2020   Rheumatoid arthritis (HCC) 07/11/2020   Cervical arthritis 07/11/2020   Gastroesophageal reflux disease 07/11/2020   Presbyopia of both eyes 04/20/2015   Pseudophakia of both eyes 04/20/2015    PCP: Loyola Mast, MD  REFERRING PROVIDER: Tarry Kos, MD  REFERRING DIAG:  Diagnosis  M17.12 (ICD-10-CM) - Primary osteoarthritis of left knee    THERAPY DIAG:  No diagnosis found.  Rationale for Evaluation and Treatment: Rehabilitation  ONSET DATE: 08/23/22  SUBJECTIVE:   SUBJECTIVE STATEMENT:  Saw surgeon on Tuesday. Happy with progress. Will see ortho again in January.  PERTINENT HISTORY: RA, GERD, back surgery,  LTKR 08/23/22 ER visit 08/30/22 due to urinary retention PAIN:  Are you having pain? Yes: NPRS scale: 0/10 Pain location: L knee Pain description: Muscle spasms Aggravating factors: fatigue Relieving factors: ice  PRECAUTIONS: Knee and Fall  RED FLAGS: None   WEIGHT BEARING RESTRICTIONS: Yes LLE WBAT  FALLS:  Has patient fallen in last 6 months? No  LIVING ENVIRONMENT: Lives with: lives with their family and lives alone Lives in: House/apartment Stairs: No Has following equipment at home: Dan Humphreys - 2 wheeled, shower chair, and bed side commode  OCCUPATION: Retired, works out, likes to walk  PLOF: Independent  PATIENT GOALS: Return to his walking routine, less pain, recover his strength  NEXT MD VISIT: 4 weeks  OBJECTIVE:   DIAGNOSTIC FINDINGS:  Knee X ray /12/24 FINDINGS: Two-view show total knee arthroplasty. Components appear well positioned. No unexpected finding. Air and fluid in the operative region as expected.   IMPRESSION: Good appearance following total knee arthroplasty.  COGNITION: Overall cognitive status:  Within functional limits for tasks assessed     SENSATION: WFL  EDEMA:  Mod swelling in knee and lower leg, red and warm  POSTURE: rounded shoulders, forward head, and weight shift right  PALPATION: L knee tight and TTP with mild redness and swelling.  LOWER EXTREMITY ROM:  Passive ROM Right eval Left eval Left AROM 09/30/22 L AROM 10/13/22 Left PROM 10/28/22 Left AROM 11/11/22  Hip flexion        Hip extension        Hip abduction        Hip adduction        Hip internal rotation        Hip external rotation        Knee flexion  101 114 110  116  Knee extension  -20 12 8  0 P! 10  Ankle dorsiflexion        Ankle plantarflexion        Ankle inversion        Ankle eversion         (Blank rows = not tested)  LOWER EXTREMITY MMT: RLE 5/5  MMT Right eval Left eval Left  10/13/22  Hip flexion  3+ 5  Hip extension     Hip abduction     Hip adduction     Hip internal rotation     Hip external rotation  3/5   Knee flexion  3+ 5  Knee extension  3- 5  Ankle dorsiflexion     Ankle plantarflexion     Ankle inversion     Ankle eversion      (Blank rows = not tested)  FUNCTIONAL TESTS:  5 times sit to stand: TBD Timed up and go (TUG):   11/11/22 12 seconds  GAIT: Distance walked: In clinic distances Assistive device utilized: Environmental consultant - 2 wheeled Level of assistance: Modified independence Comments: Step to gait pattern, slow, cautious, antalgic on L.   TODAY'S TREATMENT:                                                                                                                              DATE: 11/18/22 Bike L4 x 5 min Standing gastroc stretch on slant board x30" Standing soleus stretch on slant board x30" Seated hamstring stretch 2x30" Quad set with 3 sec overpressure x10, no overpressure x10 SLR using strap to keep extensor lag from occurring 2x10 Prone knee hang x2 min with overpressure Prone quad set x10 Manual therapy: IASTM hamstring with  massage  gun, PROM knee extension, grade II to III tibiofemoral mob for extension Backwards walking 4x50' to work on terminal knee extension  11/11/22 Bike level 4 x 5  minutes Gait around the back building one rest Sit to stand with weighted ball Leg extension 5# 2x10 verbal and tactile cues for TKE, one round of eccentrics 20# left only HS curls 2.5# SAQ cues fot quad Passive stretch focus on extension TUG 12 seconds  11/09/22 Nustep L 5 6 min Gait around the back building no rest today just a little slower coming up the hill and missing some heel strike and TKE on left  Gait outside in grass, uneven surfaces and negotiating curbs 35# leg curls both legs Leg extension 10# eccentrics on the left with cues to hold TKE 40# resisted gait with 6" step ups 40# resisted gait backwards TKE green tband 2 sets 10 Pass ext stretching  11/02/22 Bike level 5 x 6 minutes Gait around the back building no rest today just a little slower coming up the hill 35# leg curls both legs Leg extension 10# eccentrics on the left with cues to hold TKE 40# resisted gait with 6" step ups 40# resisted gait backwards Airex balance beam side stepping and tandem walking On airex ball toss Gait outside in grass, uneven surfaces and negotiating curbs Passive stretch into extension  10/28/22 Nustep level 5 x 5 minutes Bike level 5 x 5 minutes Gait around the back building good speed, one longer rest break today was very short of breath Leg curls 35# 2x10 LEg extension 10# eccentrics left  Tmill push fwd and backward cues to extend the left knee Leg press 40# x10, no wieght working  Feet on ball bridges PROM left knee extension  10/26/22 Bike L 4 Nustep L 5 min Step up-down 6 inch 10 x with cuing needed 50# cable press down 2 sets 10 Mini squat with TKE black tband 2 sets 10 Knee ext 10# 10 x hold 3 sec, SL 5# 10 x hold 3 sec Leg curls 35# 2x10 with stretch into extension b/n sets and after LAQ 3 # 2  sets 10 with focus on TKE TKE sitting tband 2 sets 10 PROM ext   10/21/22 Nustep L 6 5 min LE only with focus on TKE Bike Full Rev 5 min L 4 LLE leg press 20# focus on TKE with cuing verb and tactile 10x then 10 x with green tband 10 x Leg press 20# calf raises 2 sets 10 Leg curls 35# 2x10 with stretch into extension b/n sets and after KNEE LLE  only extension 5# really focus on TKE left eccentrics PROM flex and ext, patellar mobs   10/19/22 Nustep level 5x5 minutes LE's only Bike full revs 5 minutes level 4 Gait outside around the back building one rest break when coming up the hill Leg curls 35# 2x10 with stretch into extension b/n sets and after Leg extension 5# really focus on TKE left eccentrics Passive knee extension stretch 3# SAQ cues for TKE  10/13/22 Progress note, recheck goals  NuStep L5 x67mins  Bike L3 x72mins  Calf stretch 30s Calf raises 2x10 Leg ext 2x10 10#, eccentric lowering L x10, 5# LLE only x10 HS curls 25# 2x10, 15# LLE x10 PROM flex/ext, patellar mobs STS holding yellow ball  2x10    10/07/22 Nustep level 5 x 5 mintues Bike level 4 x 5 minutes Left only leg curls 15# 3x10, stretch into extension b/n sets 5#  leg extension left only cues for TKE Tmill push fwd only Calf stretches On airex cone toe touches with and without cane Stairs step over step Fast walk with light HHA trying to get him to take bigger steps  On airex ball toss Asked him to do heel prop extension stretch over the weekend   PATIENT EDUCATION:  Education details: POC, HEP Person educated: Patient Education method: Explanation Education comprehension: verbalized understanding  HOME EXERCISE PROGRAM: [id number]  ASSESSMENT:  CLINICAL IMPRESSION:.  Pt continues to do well. Remains most limited with knee extension. Added hamstring stretching and massage to see if tension here is part of the reason he is unable to get full extension. Tolerated manual work well.   OBJECTIVE  IMPAIRMENTS: Abnormal gait, decreased activity tolerance, decreased balance, decreased coordination, difficulty walking, decreased ROM, decreased strength, impaired flexibility, and pain.   ACTIVITY LIMITATIONS: carrying, lifting, bending, sitting, standing, squatting, sleeping, stairs, and locomotion level  PARTICIPATION LIMITATIONS: meal prep, cleaning, laundry, driving, shopping, and community activity  PERSONAL FACTORS: Past/current experiences are also affecting patient's functional outcome.   REHAB POTENTIAL: Good  CLINICAL DECISION MAKING: Evolving/moderate complexity  EVALUATION COMPLEXITY: Moderate   GOALS: Goals reviewed with patient? Yes  SHORT TERM GOALS: Target date: 08/23/22 I with initial HEP Baseline: Goal status: 09/14/22 met  2.  Increase R knee ROM to 5-100 Baseline:  Goal status:  progressing 10/13/22   10/21/22  4-115 MET  3.  Increase R knee strength to 3+/5 Baseline:  Goal status: met 10/05/22  LONG TERM GOALS: Target date: 12/02/22  I with final HEP Baseline:  Goal status: progressing 11/11/22  2.  Increase L knee strength to 5/5 Baseline:  Goal status: MET 10/13/22  3.  Increase L knee ROM to 0-120 Baseline:  Goal status: progressing 10/13/22   11/11/22 AROM 12-116 degrees  4.  Patient will walk x at least 500' with LRAD, MI Baseline:  Goal status: progressing 09/30/22  MET 10/21/22  5.  Complete 5 x STS in < 12 seconds with equal WB through BLE Baseline:  Goal status: 09/14/22 progressing, 11.67s MET 10/13/22  6.  Complete TUG in < 12 sec with LRAD, MI Baseline:  Goal status: 09/14/22 progressing, 9.25s MET 10/13/22   PLAN:  PT FREQUENCY: 2x/week  PT DURATION: 12 weeks  PLANNED INTERVENTIONS: Therapeutic exercises, Therapeutic activity, Neuromuscular re-education, Balance training, Gait training, Patient/Family education, Self Care, Joint mobilization, Cryotherapy, Moist heat, scar mobilization, Taping, Vasopneumatic device, Ionotophoresis  4mg /ml Dexamethasone, and Manual therapy  PLAN FOR NEXT SESSION: continue to push his ROM try to get his extension back.   Timotheus Salm April Ma L Earl Losee, PT 11/18/2022, 1:51 PM

## 2022-11-22 DIAGNOSIS — R338 Other retention of urine: Secondary | ICD-10-CM | POA: Diagnosis not present

## 2022-11-22 DIAGNOSIS — R3912 Poor urinary stream: Secondary | ICD-10-CM | POA: Diagnosis not present

## 2022-11-22 DIAGNOSIS — R351 Nocturia: Secondary | ICD-10-CM | POA: Diagnosis not present

## 2022-11-23 ENCOUNTER — Ambulatory Visit: Payer: Medicare PPO | Admitting: Physical Therapy

## 2022-11-23 DIAGNOSIS — R293 Abnormal posture: Secondary | ICD-10-CM | POA: Diagnosis not present

## 2022-11-23 DIAGNOSIS — R262 Difficulty in walking, not elsewhere classified: Secondary | ICD-10-CM | POA: Diagnosis not present

## 2022-11-23 DIAGNOSIS — R600 Localized edema: Secondary | ICD-10-CM | POA: Diagnosis not present

## 2022-11-23 DIAGNOSIS — M5459 Other low back pain: Secondary | ICD-10-CM | POA: Diagnosis not present

## 2022-11-23 DIAGNOSIS — M6281 Muscle weakness (generalized): Secondary | ICD-10-CM

## 2022-11-23 DIAGNOSIS — Z96652 Presence of left artificial knee joint: Secondary | ICD-10-CM

## 2022-11-23 NOTE — Therapy (Signed)
OUTPATIENT PHYSICAL THERAPY LOWER EXTREMITY    Patient Name: Joshua Ruiz MRN: [id number] DOB:02-01-1940, 82 y.o., male Today's Date: 11/23/2022  END OF SESSION:  PT End of Session - 11/23/22 1348     Visit Number 19    Number of Visits 24    Date for PT Re-Evaluation 12/05/22    Authorization Type [employer] 16/24    PT Start Time 1346    PT Stop Time 1430    PT Time Calculation (min) 44 min    Activity Tolerance Patient tolerated treatment well    Behavior During Therapy [facility] for tasks assessed/performed                Past Medical History:  Diagnosis Date   Cancer (HCC)    Skin cancer left ear and nose   Complication of anesthesia    Sinus Infection after back surgery 11/2021   GERD (gastroesophageal reflux disease)    Rheumatoid arthritis (HCC)    Past Surgical History:  Procedure Laterality Date   COLONOSCOPY  2021   EYE SURGERY Bilateral    cataract extraction   HEMORRHOID SURGERY     LAMINECTOMY WITH POSTERIOR LATERAL ARTHRODESIS LEVEL 3 N/A 11/20/2021   Procedure: LUMBAR TWO THROUGH LUMBAR THREE, LUMBAR THREE THROUGH LUMBAR FOUR, LUMBAR FOUR THROUGH LUMBAR FIVE LAMINECTOMY WITH FACETECTOMY, POSTERIOR SEGMENTAL INSTRUMENTATION, POSTEROLATERAL ARTHRODESIS;  Surgeon: Lisbeth Renshaw, MD;  Location: [facility] OR;  Service: Neurosurgery;  Laterality: N/A;  3C   LYMPH NODE BIOPSY     Neck   MOHS SURGERY Left 2016   Ear. [facility] in [place]   TOTAL KNEE ARTHROPLASTY Left 08/23/2022   Procedure: LEFT TOTAL KNEE REPLACEMENT;  Surgeon: Tarry Kos, MD;  Location: [facility] OR;  Service: Orthopedics;  Laterality: Left;   TRIGGER FINGER RELEASE Right    3rd   Patient Active Problem List   Diagnosis Date Noted   Numbness and tingling of fingertips 09/29/2022   Urinary retention 08/31/2022   Status post total left knee replacement 08/23/2022   Chronic rhinitis 02/15/2022   Eczema 02/15/2022   Spondylolisthesis of lumbar region 11/20/2021   Lumbar stenosis  with neurogenic claudication 10/20/2021   Lumbar spondylosis 10/20/2021   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-L5 level 10/20/2021   Primary osteoarthritis of left knee 08/14/2021   Chronic epididymitis 08/13/2020   Hives 07/11/2020   Rheumatoid arthritis (HCC) 07/11/2020   Cervical arthritis 07/11/2020   Gastroesophageal reflux disease 07/11/2020   Presbyopia of both eyes 04/20/2015   Pseudophakia of both eyes 04/20/2015    PCP: Loyola Mast, MD  REFERRING PROVIDER: Tarry Kos, MD  REFERRING DIAG:  Diagnosis  M17.12 (ICD-10-CM) - Primary osteoarthritis of left knee    THERAPY DIAG:  No diagnosis found.  Rationale for Evaluation and Treatment: Rehabilitation  ONSET DATE: 08/23/22  SUBJECTIVE:   SUBJECTIVE STATEMENT: Pt states he felt that his knee is straightening out more.   PERTINENT HISTORY: RA, GERD, back surgery,  LTKR 08/23/22 ER visit 08/30/22 due to urinary retention PAIN:  Are you having pain? Yes: NPRS scale: 0/10 Pain location: L knee Pain description: Muscle spasms Aggravating factors: fatigue Relieving factors: ice  PRECAUTIONS: Knee and Fall  RED FLAGS: None   WEIGHT BEARING RESTRICTIONS: Yes LLE WBAT  FALLS:  Has patient fallen in last 6 months? No  LIVING ENVIRONMENT: Lives with: lives with their family and lives alone Lives in: House/apartment Stairs: No Has following equipment at home: Dan Humphreys - 2 wheeled, shower chair, and bed side commode  OCCUPATION:  Retired, works out, likes to walk  PLOF: Independent  PATIENT GOALS: Return to his walking routine, less pain, recover his strength  NEXT MD VISIT: 4 weeks  OBJECTIVE:   DIAGNOSTIC FINDINGS:  Knee X ray /12/24 FINDINGS: Two-view show total knee arthroplasty. Components appear well positioned. No unexpected finding. Air and fluid in the operative region as expected.   IMPRESSION: Good appearance following total knee arthroplasty.  COGNITION: Overall cognitive status: Within  functional limits for tasks assessed     SENSATION: WFL  EDEMA:  Mod swelling in knee and lower leg, red and warm  POSTURE: rounded shoulders, forward head, and weight shift right  PALPATION: L knee tight and TTP with mild redness and swelling.  LOWER EXTREMITY ROM:  Passive ROM Right eval Left eval Left AROM 09/30/22 L AROM 10/13/22 Left PROM 10/28/22 Left AROM 11/11/22  Hip flexion        Hip extension        Hip abduction        Hip adduction        Hip internal rotation        Hip external rotation        Knee flexion  101 114 110  116  Knee extension  -20 12 8  0 P! 10  Ankle dorsiflexion        Ankle plantarflexion        Ankle inversion        Ankle eversion         (Blank rows = not tested)  LOWER EXTREMITY MMT: RLE 5/5  MMT Right eval Left eval Left  10/13/22  Hip flexion  3+ 5  Hip extension     Hip abduction     Hip adduction     Hip internal rotation     Hip external rotation  3/5   Knee flexion  3+ 5  Knee extension  3- 5  Ankle dorsiflexion     Ankle plantarflexion     Ankle inversion     Ankle eversion      (Blank rows = not tested)  FUNCTIONAL TESTS:  5 times sit to stand: TBD Timed up and go (TUG):   11/11/22 12 seconds  GAIT: Distance walked: In clinic distances Assistive device utilized: Environmental consultant - 2 wheeled Level of assistance: Modified independence Comments: Step to gait pattern, slow, cautious, antalgic on L.   TODAY'S TREATMENT:                                                                                                                              DATE: 11/23/22 Bike x 5 min Forwards and backwards walking x 5 each at 5#, 10#, and 15# resistance around L knee Prone knee hang x2 min with massage gun to hamstring Contract/relax hamstring in prone 5x5 sec Prone quad set with prone knee hang 2x10 Supine quad set with over pressure 2x10 Supine SLR x10, with strap assist x10 Supine hip  flexor stretch 2x30" Backwards walking  4x40' to work on terminal knee extension Forwards walking 4x40' working on heel strike with knee extension  11/18/22 Bike L4 x 5 min Standing gastroc stretch on slant board x30" Standing soleus stretch on slant board x30" Seated hamstring stretch 2x30" Quad set with 3 sec overpressure x10, no overpressure x10 SLR using strap to keep extensor lag from occurring 2x10 Prone knee hang x2 min with overpressure Prone quad set x10 Manual therapy: IASTM hamstring with massage gun, PROM knee extension, grade II to III tibiofemoral mob for extension Backwards walking 4x50' to work on terminal knee extension  11/11/22 Bike level 4 x 5  minutes Gait around the back building one rest Sit to stand with weighted ball Leg extension 5# 2x10 verbal and tactile cues for TKE, one round of eccentrics 20# left only HS curls 2.5# SAQ cues fot quad Passive stretch focus on extension TUG 12 seconds  11/09/22 Nustep L 5 6 min Gait around the back building no rest today just a little slower coming up the hill and missing some heel strike and TKE on left  Gait outside in grass, uneven surfaces and negotiating curbs 35# leg curls both legs Leg extension 10# eccentrics on the left with cues to hold TKE 40# resisted gait with 6" step ups 40# resisted gait backwards TKE green tband 2 sets 10 Pass ext stretching  11/02/22 Bike level 5 x 6 minutes Gait around the back building no rest today just a little slower coming up the hill 35# leg curls both legs Leg extension 10# eccentrics on the left with cues to hold TKE 40# resisted gait with 6" step ups 40# resisted gait backwards Airex balance beam side stepping and tandem walking On airex ball toss Gait outside in grass, uneven surfaces and negotiating curbs Passive stretch into extension  10/28/22 Nustep level 5 x 5 minutes Bike level 5 x 5 minutes Gait around the back building good speed, one longer rest break today was very short of breath Leg  curls 35# 2x10 LEg extension 10# eccentrics left  Tmill push fwd and backward cues to extend the left knee Leg press 40# x10, no wieght working  Feet on ball bridges PROM left knee extension  10/26/22 Bike L 4 Nustep L 5 min Step up-down 6 inch 10 x with cuing needed 50# cable press down 2 sets 10 Mini squat with TKE black tband 2 sets 10 Knee ext 10# 10 x hold 3 sec, SL 5# 10 x hold 3 sec Leg curls 35# 2x10 with stretch into extension b/n sets and after LAQ 3 # 2 sets 10 with focus on TKE TKE sitting tband 2 sets 10 PROM ext   10/21/22 Nustep L 6 5 min LE only with focus on TKE Bike Full Rev 5 min L 4 LLE leg press 20# focus on TKE with cuing verb and tactile 10x then 10 x with green tband 10 x Leg press 20# calf raises 2 sets 10 Leg curls 35# 2x10 with stretch into extension b/n sets and after KNEE LLE  only extension 5# really focus on TKE left eccentrics PROM flex and ext, patellar mobs   10/19/22 Nustep level 5x5 minutes LE's only Bike full revs 5 minutes level 4 Gait outside around the back building one rest break when coming up the hill Leg curls 35# 2x10 with stretch into extension b/n sets and after Leg extension 5# really focus on TKE left eccentrics Passive knee extension stretch  3# SAQ cues for TKE  10/13/22 Progress note, recheck goals  NuStep L5 x29mins  Bike L3 x62mins  Calf stretch 30s Calf raises 2x10 Leg ext 2x10 10#, eccentric lowering L x10, 5# LLE only x10 HS curls 25# 2x10, 15# LLE x10 PROM flex/ext, patellar mobs STS holding yellow ball  2x10    10/07/22 Nustep level 5 x 5 mintues Bike level 4 x 5 minutes Left only leg curls 15# 3x10, stretch into extension b/n sets 5# leg extension left only cues for TKE Tmill push fwd only Calf stretches On airex cone toe touches with and without cane Stairs step over step Fast walk with light HHA trying to get him to take bigger steps  On airex ball toss Asked him to do heel prop extension stretch  over the weekend   PATIENT EDUCATION:  Education details: POC, HEP Person educated: Patient Education method: Explanation Education comprehension: verbalized understanding  HOME EXERCISE PROGRAM: [id number]  ASSESSMENT:  CLINICAL IMPRESSION:.  Continued focus on knee extension. Worked on stretching hamstrings and improving joint motion. Less extensor lag noted during SLR. Notable knee flexion during heel strike -- worked on increasing knee extension during stance phase.  OBJECTIVE IMPAIRMENTS: Abnormal gait, decreased activity tolerance, decreased balance, decreased coordination, difficulty walking, decreased ROM, decreased strength, impaired flexibility, and pain.     GOALS: Goals reviewed with patient? Yes  SHORT TERM GOALS: Target date: 08/23/22 I with initial HEP Baseline: Goal status: 09/14/22 met  2.  Increase R knee ROM to 5-100 Baseline:  Goal status:  progressing 10/13/22   10/21/22  4-115 MET  3.  Increase R knee strength to 3+/5 Baseline:  Goal status: met 10/05/22  LONG TERM GOALS: Target date: 12/02/22  I with final HEP Baseline:  Goal status: progressing 11/11/22  2.  Increase L knee strength to 5/5 Baseline:  Goal status: MET 10/13/22  3.  Increase L knee ROM to 0-120 Baseline:  Goal status: progressing 10/13/22   11/11/22 AROM 12-116 degrees  4.  Patient will walk x at least 500' with LRAD, MI Baseline:  Goal status: progressing 09/30/22  MET 10/21/22  5.  Complete 5 x STS in < 12 seconds with equal WB through BLE Baseline:  Goal status: 09/14/22 progressing, 11.67s MET 10/13/22  6.  Complete TUG in < 12 sec with LRAD, MI Baseline:  Goal status: 09/14/22 progressing, 9.25s MET 10/13/22   PLAN:  PT FREQUENCY: 2x/week  PT DURATION: 12 weeks  PLANNED INTERVENTIONS: Therapeutic exercises, Therapeutic activity, Neuromuscular re-education, Balance training, Gait training, Patient/Family education, Self Care, Joint mobilization, Cryotherapy, Moist heat,  scar mobilization, Taping, Vasopneumatic device, Ionotophoresis 4mg /ml Dexamethasone, and Manual therapy  PLAN FOR NEXT SESSION: continue to push his ROM try to get his extension back.   Mallery Harshman April Ma L Shylee Durrett, PT 11/23/2022, 1:49 PM

## 2022-11-24 DIAGNOSIS — R351 Nocturia: Secondary | ICD-10-CM | POA: Diagnosis not present

## 2022-11-24 DIAGNOSIS — N401 Enlarged prostate with lower urinary tract symptoms: Secondary | ICD-10-CM | POA: Diagnosis not present

## 2022-11-24 DIAGNOSIS — R3914 Feeling of incomplete bladder emptying: Secondary | ICD-10-CM | POA: Diagnosis not present

## 2022-11-25 ENCOUNTER — Encounter: Payer: Self-pay | Admitting: Physical Therapy

## 2022-11-25 ENCOUNTER — Ambulatory Visit: Payer: Medicare PPO | Admitting: Physical Therapy

## 2022-11-25 DIAGNOSIS — R262 Difficulty in walking, not elsewhere classified: Secondary | ICD-10-CM | POA: Diagnosis not present

## 2022-11-25 DIAGNOSIS — M6281 Muscle weakness (generalized): Secondary | ICD-10-CM

## 2022-11-25 DIAGNOSIS — R600 Localized edema: Secondary | ICD-10-CM | POA: Diagnosis not present

## 2022-11-25 DIAGNOSIS — M5459 Other low back pain: Secondary | ICD-10-CM | POA: Diagnosis not present

## 2022-11-25 DIAGNOSIS — Z96652 Presence of left artificial knee joint: Secondary | ICD-10-CM

## 2022-11-25 DIAGNOSIS — R293 Abnormal posture: Secondary | ICD-10-CM | POA: Diagnosis not present

## 2022-11-25 NOTE — Therapy (Signed)
OUTPATIENT PHYSICAL THERAPY LOWER EXTREMITY  Progress Note Reporting Period 10/19/22 to 11/25/22 for visits 11-20  See note below for Objective Data and Assessment of Progress/Goals.      Patient Name: Joshua Ruiz MRN: [id number] DOB:1940-03-01, 82 y.o., male Today's Date: 11/25/2022  END OF SESSION:  PT End of Session - 11/25/22 1311     Visit Number 20    Number of Visits 24    Date for PT Re-Evaluation 12/05/22    Authorization Type [employer] 20/24    PT Start Time 1310    PT Stop Time 1400    PT Time Calculation (min) 50 min    Activity Tolerance Patient tolerated treatment well    Behavior During Therapy WFL for tasks assessed/performed                Past Medical History:  Diagnosis Date   Cancer (HCC)    Skin cancer left ear and nose   Complication of anesthesia    Sinus Infection after back surgery 11/2021   GERD (gastroesophageal reflux disease)    Rheumatoid arthritis (HCC)    Past Surgical History:  Procedure Laterality Date   COLONOSCOPY  2021   EYE SURGERY Bilateral    cataract extraction   HEMORRHOID SURGERY     LAMINECTOMY WITH POSTERIOR LATERAL ARTHRODESIS LEVEL 3 N/A 11/20/2021   Procedure: LUMBAR TWO THROUGH LUMBAR THREE, LUMBAR THREE THROUGH LUMBAR FOUR, LUMBAR FOUR THROUGH LUMBAR FIVE LAMINECTOMY WITH FACETECTOMY, POSTERIOR SEGMENTAL INSTRUMENTATION, POSTEROLATERAL ARTHRODESIS;  Surgeon: Lisbeth Renshaw, MD;  Location: [facility] OR;  Service: Neurosurgery;  Laterality: N/A;  3C   LYMPH NODE BIOPSY     Neck   MOHS SURGERY Left 2016   Ear. [facility] in [place]   TOTAL KNEE ARTHROPLASTY Left 08/23/2022   Procedure: LEFT TOTAL KNEE REPLACEMENT;  Surgeon: Tarry Kos, MD;  Location: [facility] OR;  Service: Orthopedics;  Laterality: Left;   TRIGGER FINGER RELEASE Right    3rd   Patient Active Problem List   Diagnosis Date Noted   Numbness and tingling of fingertips 09/29/2022   Urinary retention 08/31/2022   Status post total left knee  replacement 08/23/2022   Chronic rhinitis 02/15/2022   Eczema 02/15/2022   Spondylolisthesis of lumbar region 11/20/2021   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/2021   Lumbar spondylosis 10/20/2021   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-L5 level 10/20/2021   Primary osteoarthritis of left knee 08/14/2021   Chronic epididymitis 08/13/2020   Hives 07/11/2020   Rheumatoid arthritis (HCC) 07/11/2020   Cervical arthritis 07/11/2020   Gastroesophageal reflux disease 07/11/2020   Presbyopia of both eyes 04/20/2015   Pseudophakia of both eyes 04/20/2015    PCP: Loyola Mast, MD  REFERRING PROVIDER: Tarry Kos, MD  REFERRING DIAG:  Diagnosis  M17.12 (ICD-10-CM) - Primary osteoarthritis of left knee    THERAPY DIAG:  S/P TKR (total knee replacement), left  Muscle weakness (generalized)  Difficulty in walking, not elsewhere classified  Localized edema  Rationale for Evaluation and Treatment: Rehabilitation  ONSET DATE: 08/23/22  SUBJECTIVE:   SUBJECTIVE STATEMENT: continues to report that he feels it is starting to work better and feel straighter, reports some difficulty getting up from sitting at times PERTINENT HISTORY: RA, GERD, back surgery,  LTKR 08/23/22 ER visit 08/30/22 due to urinary retention PAIN:  Are you having pain? Yes: NPRS scale: 0/10 Pain location: L knee Pain description: Muscle spasms Aggravating factors: fatigue Relieving factors: ice  PRECAUTIONS: Knee and Fall  RED FLAGS: None  WEIGHT BEARING RESTRICTIONS: Yes LLE WBAT  FALLS:  Has patient fallen in last 6 months? No  LIVING ENVIRONMENT: Lives with: lives with their family and lives alone Lives in: House/apartment Stairs: No Has following equipment at home: Dan Humphreys - 2 wheeled, shower chair, and bed side commode  OCCUPATION: Retired, works out, likes to walk  PLOF: Independent  PATIENT GOALS: Return to his walking routine, less pain, recover his strength  NEXT MD VISIT: 4  weeks  OBJECTIVE:   DIAGNOSTIC FINDINGS:  Knee X ray /12/24 FINDINGS: Two-view show total knee arthroplasty. Components appear well positioned. No unexpected finding. Air and fluid in the operative region as expected.   IMPRESSION: Good appearance following total knee arthroplasty.  COGNITION: Overall cognitive status: Within functional limits for tasks assessed     SENSATION: WFL  EDEMA:  Mod swelling in knee and lower leg, red and warm  POSTURE: rounded shoulders, forward head, and weight shift right  PALPATION: L knee tight and TTP with mild redness and swelling.  LOWER EXTREMITY ROM:  Passive ROM Right eval Left eval Left AROM 09/30/22 L AROM 10/13/22 Left PROM 10/28/22 Left AROM 11/11/22 Left AROM 11/25/22  Hip flexion         Hip extension         Hip abduction         Hip adduction         Hip internal rotation         Hip external rotation         Knee flexion  101 114 110  116 117  Knee extension  -20 12 8  0 P! 10 8  Ankle dorsiflexion         Ankle plantarflexion         Ankle inversion         Ankle eversion          (Blank rows = not tested)  LOWER EXTREMITY MMT: RLE 5/5  MMT Right eval Left eval Left  10/13/22  Hip flexion  3+ 5  Hip extension     Hip abduction     Hip adduction     Hip internal rotation     Hip external rotation  3/5   Knee flexion  3+ 5  Knee extension  3- 5  Ankle dorsiflexion     Ankle plantarflexion     Ankle inversion     Ankle eversion      (Blank rows = not tested)  FUNCTIONAL TESTS:  5 times sit to stand: TBD Timed up and go (TUG):   11/11/22 12 seconds  GAIT: Distance walked: In clinic distances Assistive device utilized: Walker - 2 wheeled Level of assistance: Modified independence Comments: Step to gait pattern, slow, cautious, antalgic on L.   TODAY'S TREATMENT:  DATE: 11/25/22 Nustep level 5 x 5 minutes Bike level 4 x 5 mintues Tmill push fwd and back focus on extension Stairs 4" and 6" step over step Prone with leg hang for extension and use of the tgun on the HS Some prone QS and HS contract relax to gain extension Passive extension in supine Back to wall ball behind knee TKE Blue tband back to wall TKE  11/23/22 Bike x 5 min Forwards and backwards walking x 5 each at 5#, 10#, and 15# resistance around L knee Prone knee hang x2 min with massage gun to hamstring Contract/relax hamstring in prone 5x5 sec Prone quad set with prone knee hang 2x10 Supine quad set with over pressure 2x10 Supine SLR x10, with strap assist x10 Supine hip flexor stretch 2x30" Backwards walking 4x40' to work on terminal knee extension Forwards walking 4x40' working on heel strike with knee extension  11/18/22 Bike L4 x 5 min Standing gastroc stretch on slant board x30" Standing soleus stretch on slant board x30" Seated hamstring stretch 2x30" Quad set with 3 sec overpressure x10, no overpressure x10 SLR using strap to keep extensor lag from occurring 2x10 Prone knee hang x2 min with overpressure Prone quad set x10 Manual therapy: IASTM hamstring with massage gun, PROM knee extension, grade II to III tibiofemoral mob for extension Backwards walking 4x50' to work on terminal knee extension  11/11/22 Bike level 4 x 5  minutes Gait around the back building one rest Sit to stand with weighted ball Leg extension 5# 2x10 verbal and tactile cues for TKE, one round of eccentrics 20# left only HS curls 2.5# SAQ cues fot quad Passive stretch focus on extension TUG 12 seconds  11/09/22 Nustep L 5 6 min Gait around the back building no rest today just a little slower coming up the hill and missing some heel strike and TKE on left  Gait outside in grass, uneven surfaces and negotiating curbs 35# leg curls both legs Leg extension 10# eccentrics on the left with  cues to hold TKE 40# resisted gait with 6" step ups 40# resisted gait backwards TKE green tband 2 sets 10 Pass ext stretching  11/02/22 Bike level 5 x 6 minutes Gait around the back building no rest today just a little slower coming up the hill 35# leg curls both legs Leg extension 10# eccentrics on the left with cues to hold TKE 40# resisted gait with 6" step ups 40# resisted gait backwards Airex balance beam side stepping and tandem walking On airex ball toss Gait outside in grass, uneven surfaces and negotiating curbs Passive stretch into extension  10/28/22 Nustep level 5 x 5 minutes Bike level 5 x 5 minutes Gait around the back building good speed, one longer rest break today was very short of breath Leg curls 35# 2x10 LEg extension 10# eccentrics left  Tmill push fwd and backward cues to extend the left knee Leg press 40# x10, no wieght working  Feet on ball bridges PROM left knee extension   PATIENT EDUCATION:  Education details: POC, HEP Person educated: Patient Education method: Explanation Education comprehension: verbalized understanding  HOME EXERCISE PROGRAM: [id number]  ASSESSMENT:  CLINICAL IMPRESSION:.  Continued focus on knee extension. I really feel we are starting to make some progress, he is walking better with a better heel strike still slight ER of the leg, struggles going down stairs step over step on 6" steps, tends to want to do one at a time, and if he does step  over step down he will rotate foot out and stick his backside out to lessen the strain.  He also reports doing better with shopping, still some endurance issues with fatigue and being short of breath  OBJECTIVE IMPAIRMENTS: Abnormal gait, decreased activity tolerance, decreased balance, decreased coordination, difficulty walking, decreased ROM, decreased strength, impaired flexibility, and pain.     GOALS: Goals reviewed with patient? Yes  SHORT TERM GOALS: Target date: 08/23/22 I  with initial HEP Baseline: Goal status: 09/14/22 met  2.  Increase R knee ROM to 5-100 Baseline:  Goal status:  still struggling with full extension against gravity today was 8-117 degrees flexion  3.  Increase R knee strength to 3+/5 Baseline:  Goal status: met 10/05/22  LONG TERM GOALS: Target date: 12/02/22  I with final HEP Baseline:  Goal status: progressing 11/11/22  2.  Increase L knee strength to 5/5 Baseline:  Goal status: MET 10/13/22  3.  Increase L knee ROM to 0-120 Baseline:  Goal status: 8-117 degrees 11/25/22 4.  Patient will walk x at least 500' with LRAD, MI Baseline:  Goal status: progressing 09/30/22  MET 10/21/22  5.  Complete 5 x STS in < 12 seconds with equal WB through BLE Baseline:  Goal status: 09/14/22 progressing, 11.67s MET 10/13/22  6.  Complete TUG in < 12 sec with LRAD, MI Baseline:  Goal status: 09/14/22 progressing, 9.25s MET 10/13/22   PLAN:  PT FREQUENCY: 2x/week  PT DURATION: 12 weeks  PLANNED INTERVENTIONS: Therapeutic exercises, Therapeutic activity, Neuromuscular re-education, Balance training, Gait training, Patient/Family education, Self Care, Joint mobilization, Cryotherapy, Moist heat, scar mobilization, Taping, Vasopneumatic device, Ionotophoresis 4mg /ml Dexamethasone, and Manual therapy  PLAN FOR NEXT SESSION: continue to push his ROM try to get his extension back. Next week we may need to ask for 1x/week for 4-5 weeks to try to maximize ROM and better his stairs and endurance  Cassandr Cederberg W, PT 11/25/2022, 1:13 PM

## 2022-11-30 ENCOUNTER — Encounter: Payer: Self-pay | Admitting: Physical Therapy

## 2022-11-30 ENCOUNTER — Ambulatory Visit: Payer: Medicare PPO | Admitting: Physical Therapy

## 2022-11-30 DIAGNOSIS — Z96652 Presence of left artificial knee joint: Secondary | ICD-10-CM

## 2022-11-30 DIAGNOSIS — M6281 Muscle weakness (generalized): Secondary | ICD-10-CM

## 2022-11-30 DIAGNOSIS — R3912 Poor urinary stream: Secondary | ICD-10-CM | POA: Diagnosis not present

## 2022-11-30 DIAGNOSIS — R293 Abnormal posture: Secondary | ICD-10-CM | POA: Diagnosis not present

## 2022-11-30 DIAGNOSIS — R600 Localized edema: Secondary | ICD-10-CM

## 2022-11-30 DIAGNOSIS — M5459 Other low back pain: Secondary | ICD-10-CM | POA: Diagnosis not present

## 2022-11-30 DIAGNOSIS — N401 Enlarged prostate with lower urinary tract symptoms: Secondary | ICD-10-CM | POA: Diagnosis not present

## 2022-11-30 DIAGNOSIS — R262 Difficulty in walking, not elsewhere classified: Secondary | ICD-10-CM | POA: Diagnosis not present

## 2022-11-30 NOTE — Therapy (Signed)
OUTPATIENT PHYSICAL THERAPY LOWER EXTREMITY  Progress Note Reporting Period 10/19/22 to 11/25/22 for visits 11-20  See note below for Objective Data and Assessment of Progress/Goals.      Patient Name: Joshua Ruiz MRN: [id number] DOB:May 30, 1940, 82 y.o., male Today's Date: 11/30/2022  END OF SESSION:  PT End of Session - 11/30/22 1056     Visit Number 21    Number of Visits 24    Date for PT Re-Evaluation 12/05/22    Authorization Type [employer] 21/24    PT Start Time 1055    PT Stop Time 1145    PT Time Calculation (min) 50 min    Activity Tolerance Patient tolerated treatment well    Behavior During Therapy WFL for tasks assessed/performed                Past Medical History:  Diagnosis Date   Cancer (HCC)    Skin cancer left ear and nose   Complication of anesthesia    Sinus Infection after back surgery 11/2021   GERD (gastroesophageal reflux disease)    Rheumatoid arthritis (HCC)    Past Surgical History:  Procedure Laterality Date   COLONOSCOPY  2021   EYE SURGERY Bilateral    cataract extraction   HEMORRHOID SURGERY     LAMINECTOMY WITH POSTERIOR LATERAL ARTHRODESIS LEVEL 3 N/A 11/20/2021   Procedure: LUMBAR TWO THROUGH LUMBAR THREE, LUMBAR THREE THROUGH LUMBAR FOUR, LUMBAR FOUR THROUGH LUMBAR FIVE LAMINECTOMY WITH FACETECTOMY, POSTERIOR SEGMENTAL INSTRUMENTATION, POSTEROLATERAL ARTHRODESIS;  Surgeon: Lisbeth Renshaw, MD;  Location: [facility] OR;  Service: Neurosurgery;  Laterality: N/A;  3C   LYMPH NODE BIOPSY     Neck   MOHS SURGERY Left 2016   [facility]ar. [facility] in [place]   TOTAL KNEE ARTHROPLASTY Left 08/23/2022   Procedure: LEFT TOTAL KNEE REPLACEMENT;  Surgeon: Tarry Kos, MD;  Location: [facility] OR;  Service: Orthopedics;  Laterality: Left;   TRIGGER FINGER RELEASE Right    3rd   Patient Active Problem List   Diagnosis Date Noted   Numbness and tingling of fingertips 09/29/2022   Urinary retention 08/31/2022   Status post total left knee  replacement 08/23/2022   Chronic rhinitis 02/15/2022   Eczema 02/15/2022   Spondylolisthesis of lumbar region 11/20/2021   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/2021   Lumbar spondylosis 10/20/2021   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-L5 level 10/20/2021   Primary osteoarthritis of left knee 08/14/2021   Chronic epididymitis 08/13/2020   Hives 07/11/2020   Rheumatoid arthritis (HCC) 07/11/2020   Cervical arthritis 07/11/2020   Gastroesophageal reflux disease 07/11/2020   Presbyopia of both eyes 04/20/2015   Pseudophakia of both eyes 04/20/2015    PCP: Loyola Mast, MD  REFERRING PROVIDER: Tarry Kos, MD  REFERRING DIAG:  Diagnosis  M17.12 (ICD-10-CM) - Primary osteoarthritis of left knee    THERAPY DIAG:  S/P TKR (total knee replacement), left  Muscle weakness (generalized)  Difficulty in walking, not elsewhere classified  Localized edema  Rationale for Evaluation and Treatment: Rehabilitation  ONSET DATE: 08/23/22  SUBJECTIVE:   SUBJECTIVE STATEMENT: cI feel like it is starting to get better PERTINENT HISTORY: RA, GERD, back surgery,  LTKR 08/23/22 ER visit 08/30/22 due to urinary retention PAIN:  Are you having pain? Yes: NPRS scale: 0/10 Pain location: L knee Pain description: Muscle spasms Aggravating factors: fatigue Relieving factors: ice  PRECAUTIONS: Knee and Fall  RED FLAGS: None   WEIGHT BEARING RESTRICTIONS: Yes LLE WBAT  FALLS:  Has patient fallen in last  6 months? No  LIVING ENVIRONMENT: Lives with: lives with their family and lives alone Lives in: House/apartment Stairs: No Has following equipment at home: Dan Humphreys - 2 wheeled, shower chair, and bed side commode  OCCUPATION: Retired, works out, likes to walk  PLOF: Independent  PATIENT GOALS: Return to his walking routine, less pain, recover his strength  NEXT MD VISIT: 4 weeks  OBJECTIVE:   DIAGNOSTIC FINDINGS:  Knee X ray /12/24 FINDINGS: Two-view show total knee  arthroplasty. Components appear well positioned. No unexpected finding. Air and fluid in the operative region as expected.   IMPRESSION: Good appearance following total knee arthroplasty.  COGNITION: Overall cognitive status: Within functional limits for tasks assessed     SENSATION: WFL  EDEMA:  Mod swelling in knee and lower leg, red and warm  POSTURE: rounded shoulders, forward head, and weight shift right  PALPATION: L knee tight and TTP with mild redness and swelling.  LOWER EXTREMITY ROM:  Passive ROM Right eval Left eval Left AROM 09/30/22 L AROM 10/13/22 Left PROM 10/28/22 Left AROM 11/11/22 Left AROM 11/30/22  Hip flexion         Hip extension         Hip abduction         Hip adduction         Hip internal rotation         Hip external rotation         Knee flexion  101 114 110  116 118  Knee extension  -20 12 8  0 P! 10 7  Ankle dorsiflexion         Ankle plantarflexion         Ankle inversion         Ankle eversion          (Blank rows = not tested)  LOWER EXTREMITY MMT: RLE 5/5  MMT Right eval Left eval Left  10/13/22  Hip flexion  3+ 5  Hip extension     Hip abduction     Hip adduction     Hip internal rotation     Hip external rotation  3/5   Knee flexion  3+ 5  Knee extension  3- 5  Ankle dorsiflexion     Ankle plantarflexion     Ankle inversion     Ankle eversion      (Blank rows = not tested)  FUNCTIONAL TESTS:  5 times sit to stand: TBD Timed up and go (TUG):   11/11/22 12 seconds  GAIT: Distance walked: In clinic distances Assistive device utilized: Walker - 2 wheeled Level of assistance: Modified independence Comments: Step to gait pattern, slow, cautious, antalgic on L.   TODAY'S TREATMENT:                                                                                                                              DATE: 11/30/22 Nustep level 6 LE only x 3 minutes  Bike level 5 x 3 minutes Gait outside around the back  building no rest break TKE with ball behind knee  TKE with green tband behind knee Tmill push 20 seconds x4 Leg extension 10# eccentrics Prone leg hang SAQ Passive stretch into extension  11/25/22 Nustep level 5 x 5 minutes Bike level 4 x 5 mintues Tmill push fwd and back focus on extension Stairs 4" and 6" step over step Prone with leg hang for extension and use of the tgun on the HS Some prone QS and HS contract relax to gain extension Passive extension in supine Back to wall ball behind knee TKE Blue tband back to wall TKE  11/23/22 Bike x 5 min Forwards and backwards walking x 5 each at 5#, 10#, and 15# resistance around L knee Prone knee hang x2 min with massage gun to hamstring Contract/relax hamstring in prone 5x5 sec Prone quad set with prone knee hang 2x10 Supine quad set with over pressure 2x10 Supine SLR x10, with strap assist x10 Supine hip flexor stretch 2x30" Backwards walking 4x40' to work on terminal knee extension Forwards walking 4x40' working on heel strike with knee extension  11/18/22 Bike L4 x 5 min Standing gastroc stretch on slant board x30" Standing soleus stretch on slant board x30" Seated hamstring stretch 2x30" Quad set with 3 sec overpressure x10, no overpressure x10 SLR using strap to keep extensor lag from occurring 2x10 Prone knee hang x2 min with overpressure Prone quad set x10 Manual therapy: IASTM hamstring with massage gun, PROM knee extension, grade II to III tibiofemoral mob for extension Backwards walking 4x50' to work on terminal knee extension  11/11/22 Bike level 4 x 5  minutes Gait around the back building one rest Sit to stand with weighted ball Leg extension 5# 2x10 verbal and tactile cues for TKE, one round of eccentrics 20# left only HS curls 2.5# SAQ cues fot quad Passive stretch focus on extension TUG 12 seconds  11/09/22 Nustep L 5 6 min Gait around the back building no rest today just a little slower coming up  the hill and missing some heel strike and TKE on left  Gait outside in grass, uneven surfaces and negotiating curbs 35# leg curls both legs Leg extension 10# eccentrics on the left with cues to hold TKE 40# resisted gait with 6" step ups 40# resisted gait backwards TKE green tband 2 sets 10 Pass ext stretching  11/02/22 Bike level 5 x 6 minutes Gait around the back building no rest today just a little slower coming up the hill 35# leg curls both legs Leg extension 10# eccentrics on the left with cues to hold TKE 40# resisted gait with 6" step ups 40# resisted gait backwards Airex balance beam side stepping and tandem walking On airex ball toss Gait outside in grass, uneven surfaces and negotiating curbs Passive stretch into extension  10/28/22 Nustep level 5 x 5 minutes Bike level 5 x 5 minutes Gait around the back building good speed, one longer rest break today was very short of breath Leg curls 35# 2x10 LEg extension 10# eccentrics left  Tmill push fwd and backward cues to extend the left knee Leg press 40# x10, no wieght working  Feet on ball bridges PROM left knee extension   PATIENT EDUCATION:  Education details: POC, HEP Person educated: Patient Education method: Explanation Education comprehension: verbalized understanding  HOME EXERCISE PROGRAM: [id number]  ASSESSMENT:  CLINICAL IMPRESSION:.  Continued focus on knee extension. I really feel we  are starting to make some progress, he is walking better with a better heel strike still slight ER of the leg, struggles going down stairs step over step on 6" steps, tends to want to do one at a time, and if he does step over step down he will rotate foot out and stick his backside out to lessen the strain.  He also reports doing better with shopping, still some endurance issues with fatigue and being short of breath, lacking a little bit of extension  OBJECTIVE IMPAIRMENTS: Abnormal gait, decreased activity tolerance,  decreased balance, decreased coordination, difficulty walking, decreased ROM, decreased strength, impaired flexibility, and pain.     GOALS: Goals reviewed with patient? Yes  SHORT TERM GOALS: Target date: 08/23/22 I with initial HEP Baseline: Goal status: 09/14/22 met  2.  Increase R knee ROM to 5-100 Baseline:  Goal status:  still struggling with full extension against gravity today was 8-117 degrees flexion  3.  Increase R knee strength to 3+/5 Baseline:  Goal status: met 10/05/22  LONG TERM GOALS: Target date: 12/02/22  I with final HEP Baseline:  Goal status: progressing 11/11/22  2.  Increase L knee strength to 5/5 Baseline:  Goal status: MET 10/13/22  3.  Increase L knee ROM to 0-120 Baseline:  Goal status: 8-117 degrees 11/25/22 4.  Patient will walk x at least 500' with LRAD, MI Baseline:  Goal status: progressing 09/30/22  MET 10/21/22  5.  Complete 5 x STS in < 12 seconds with equal WB through BLE Baseline:  Goal status: 09/14/22 progressing, 11.67s MET 10/13/22  6.  Complete TUG in < 12 sec with LRAD, MI Baseline:  Goal status: 09/14/22 progressing, 9.25s MET 10/13/22   7.  Able to go up and down stairs without difficulty added 11/30/22 PLAN:  PT FREQUENCY: 2x/week  PT DURATION: 12 weeks  PLANNED INTERVENTIONS: Therapeutic exercises, Therapeutic activity, Neuromuscular re-education, Balance training, Gait training, Patient/Family education, Self Care, Joint mobilization, Cryotherapy, Moist heat, scar mobilization, Taping, Vasopneumatic device, Ionotophoresis 4mg /ml Dexamethasone, and Manual therapy  PLAN FOR NEXT SESSION: continue to push his ROM try to get his extension back. Next week we may need to ask for 1x/week for 4-5 weeks to try to maximize ROM and better his stairs and endurance, I added a stair goal and we still need full extension, will need to submit for further visits next visit  Jearld Lesch, PT 11/30/2022, 10:56 AM

## 2022-12-02 ENCOUNTER — Ambulatory Visit: Payer: Medicare PPO | Admitting: Physical Therapy

## 2022-12-02 DIAGNOSIS — M6281 Muscle weakness (generalized): Secondary | ICD-10-CM

## 2022-12-02 DIAGNOSIS — R293 Abnormal posture: Secondary | ICD-10-CM

## 2022-12-02 DIAGNOSIS — Z96652 Presence of left artificial knee joint: Secondary | ICD-10-CM | POA: Diagnosis not present

## 2022-12-02 DIAGNOSIS — R600 Localized edema: Secondary | ICD-10-CM | POA: Diagnosis not present

## 2022-12-02 DIAGNOSIS — R262 Difficulty in walking, not elsewhere classified: Secondary | ICD-10-CM | POA: Diagnosis not present

## 2022-12-02 DIAGNOSIS — M5459 Other low back pain: Secondary | ICD-10-CM

## 2022-12-02 NOTE — Therapy (Signed)
OUTPATIENT PHYSICAL THERAPY LOWER EXTREMITY    Patient Name: Joshua Ruiz MRN: [id number] DOB:06/20/1940, 82 y.o., male Today's Date: 12/02/2022  END OF SESSION:  PT End of Session - 12/02/22 1342     Visit Number 22    Number of Visits 27    Date for PT Re-Evaluation 01/13/23    Authorization Type [employer]    Authorization - Visit Number 22    Authorization - Number of Visits 27    PT Start Time 1345    PT Stop Time 1425    PT Time Calculation (min) 40 min    Activity Tolerance Patient tolerated treatment well    Behavior During Therapy WFL for tasks assessed/performed                Past Medical History:  Diagnosis Date   Cancer (HCC)    Skin cancer left ear and nose   Complication of anesthesia    Sinus Infection after back surgery 11/2021   GERD (gastroesophageal reflux disease)    Rheumatoid arthritis (HCC)    Past Surgical History:  Procedure Laterality Date   COLONOSCOPY  2021   EYE SURGERY Bilateral    cataract extraction   HEMORRHOID SURGERY     LAMINECTOMY WITH POSTERIOR LATERAL ARTHRODESIS LEVEL 3 N/A 11/20/2021   Procedure: LUMBAR TWO THROUGH LUMBAR THREE, LUMBAR THREE THROUGH LUMBAR FOUR, LUMBAR FOUR THROUGH LUMBAR FIVE LAMINECTOMY WITH FACETECTOMY, POSTERIOR SEGMENTAL INSTRUMENTATION, POSTEROLATERAL ARTHRODESIS;  Surgeon: Lisbeth Renshaw, MD;  Location: [facility] OR;  Service: Neurosurgery;  Laterality: N/A;  3C   LYMPH NODE BIOPSY     Neck   MOHS SURGERY Left 2016   Ear. [facility] in [place]   TOTAL KNEE ARTHROPLASTY Left 08/23/2022   Procedure: LEFT TOTAL KNEE REPLACEMENT;  Surgeon: Tarry Kos, MD;  Location: [facility] OR;  Service: Orthopedics;  Laterality: Left;   TRIGGER FINGER RELEASE Right    3rd   Patient Active Problem List   Diagnosis Date Noted   Numbness and tingling of fingertips 09/29/2022   Urinary retention 08/31/2022   Status post total left knee replacement 08/23/2022   Chronic rhinitis 02/15/2022   Eczema 02/15/2022    Spondylolisthesis of lumbar region 11/20/2021   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/2021   Lumbar spondylosis 10/20/2021   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-L5 level 10/20/2021   Primary osteoarthritis of left knee 08/14/2021   Chronic epididymitis 08/13/2020   Hives 07/11/2020   Rheumatoid arthritis (HCC) 07/11/2020   Cervical arthritis 07/11/2020   Gastroesophageal reflux disease 07/11/2020   Presbyopia of both eyes 04/20/2015   Pseudophakia of both eyes 04/20/2015    PCP: Loyola Mast, MD  REFERRING PROVIDER: Tarry Kos, MD  REFERRING DIAG:  Diagnosis  M17.12 (ICD-10-CM) - Primary osteoarthritis of left knee    THERAPY DIAG:  S/P TKR (total knee replacement), left  Muscle weakness (generalized)  Difficulty in walking, not elsewhere classified  Localized edema  Abnormal posture  Other low back pain  Rationale for Evaluation and Treatment: Rehabilitation  ONSET DATE: 08/23/22  SUBJECTIVE:   SUBJECTIVE STATEMENT: Pt states he has been doing well.   PERTINENT HISTORY: RA, GERD, back surgery,  LTKR 08/23/22 ER visit 08/30/22 due to urinary retention PAIN:  Are you having pain? Yes: NPRS scale: 0/10 Pain location: L knee Pain description: Muscle spasms Aggravating factors: fatigue Relieving factors: ice  PRECAUTIONS: Knee and Fall  RED FLAGS: None   WEIGHT BEARING RESTRICTIONS: Yes LLE WBAT  FALLS:  Has patient fallen in last  6 months? No  LIVING ENVIRONMENT: Lives with: lives with their family and lives alone Lives in: House/apartment Stairs: No Has following equipment at home: Dan Humphreys - 2 wheeled, shower chair, and bed side commode  OCCUPATION: Retired, works out, likes to walk  PLOF: Independent  PATIENT GOALS: Return to his walking routine, less pain, recover his strength  NEXT MD VISIT: 4 weeks  OBJECTIVE:   DIAGNOSTIC FINDINGS:  Knee X ray /12/24 FINDINGS: Two-view show total knee arthroplasty. Components appear  well positioned. No unexpected finding. Air and fluid in the operative region as expected.   IMPRESSION: Good appearance following total knee arthroplasty.  COGNITION: Overall cognitive status: Within functional limits for tasks assessed     SENSATION: WFL  EDEMA:  Mod swelling in knee and lower leg, red and warm  POSTURE: rounded shoulders, forward head, and weight shift right  PALPATION: L knee tight and TTP with mild redness and swelling.  LOWER EXTREMITY ROM:  Passive ROM Right eval Left eval Left AROM 09/30/22 L AROM 10/13/22 Left PROM 10/28/22 Left AROM 11/11/22 Left AROM 11/30/22  Hip flexion         Hip extension         Hip abduction         Hip adduction         Hip internal rotation         Hip external rotation         Knee flexion  101 114 110  116 118  Knee extension  -20 12 8  0 P! 10 7  Ankle dorsiflexion         Ankle plantarflexion         Ankle inversion         Ankle eversion          (Blank rows = not tested)  LOWER EXTREMITY MMT: RLE 5/5  MMT Right eval Left eval Left  10/13/22  Hip flexion  3+ 5  Hip extension     Hip abduction     Hip adduction     Hip internal rotation     Hip external rotation  3/5   Knee flexion  3+ 5  Knee extension  3- 5  Ankle dorsiflexion     Ankle plantarflexion     Ankle inversion     Ankle eversion      (Blank rows = not tested)  FUNCTIONAL TESTS:  5 times sit to stand: TBD Timed up and go (TUG):   11/11/22 12 seconds  GAIT: Distance walked: In clinic distances Assistive device utilized: Walker - 2 wheeled Level of assistance: Modified independence Comments: Step to gait pattern, slow, cautious, antalgic on L.   TODAY'S TREATMENT:                                                                                                                              DATE: 12/02/22 Bike L4.5 x 5 min Prone knee hang  x2 min, with overpressure for increased extension Prone knee hang with quad set x10 + PT  overpressure Manual therapy: Popliteus muscle STM and muscle release Supine quad set with overpressure 2x10x5" Eccentric step down 4" step 2x10 (with and then without UE support) Eccentric side step down 4" step 2x10 (without UE support) Standing heel strike + quad set 2x10 (leaning against wall for balance) Standing stance phase with quad set 2x10  11/30/22 Nustep level 6 LE only x 3 minutes Bike level 5 x 3 minutes Gait outside around the back building no rest break TKE with ball behind knee  TKE with green tband behind knee Tmill push 20 seconds x4 Leg extension 10# eccentrics Prone leg hang SAQ Passive stretch into extension  11/25/22 Nustep level 5 x 5 minutes Bike level 4 x 5 mintues Tmill push fwd and back focus on extension Stairs 4" and 6" step over step Prone with leg hang for extension and use of the tgun on the HS Some prone QS and HS contract relax to gain extension Passive extension in supine Back to wall ball behind knee TKE Blue tband back to wall TKE  11/23/22 Bike x 5 min Forwards and backwards walking x 5 each at 5#, 10#, and 15# resistance around L knee Prone knee hang x2 min with massage gun to hamstring Contract/relax hamstring in prone 5x5 sec Prone quad set with prone knee hang 2x10 Supine quad set with over pressure 2x10 Supine SLR x10, with strap assist x10 Supine hip flexor stretch 2x30" Backwards walking 4x40' to work on terminal knee extension Forwards walking 4x40' working on heel strike with knee extension  11/18/22 Bike L4 x 5 min Standing gastroc stretch on slant board x30" Standing soleus stretch on slant board x30" Seated hamstring stretch 2x30" Quad set with 3 sec overpressure x10, no overpressure x10 SLR using strap to keep extensor lag from occurring 2x10 Prone knee hang x2 min with overpressure Prone quad set x10 Manual therapy: IASTM hamstring with massage gun, PROM knee extension, grade II to III tibiofemoral mob for  extension Backwards walking 4x50' to work on terminal knee extension  11/11/22 Bike level 4 x 5  minutes Gait around the back building one rest Sit to stand with weighted ball Leg extension 5# 2x10 verbal and tactile cues for TKE, one round of eccentrics 20# left only HS curls 2.5# SAQ cues fot quad Passive stretch focus on extension TUG 12 seconds  11/09/22 Nustep L 5 6 min Gait around the back building no rest today just a little slower coming up the hill and missing some heel strike and TKE on left  Gait outside in grass, uneven surfaces and negotiating curbs 35# leg curls both legs Leg extension 10# eccentrics on the left with cues to hold TKE 40# resisted gait with 6" step ups 40# resisted gait backwards TKE green tband 2 sets 10 Pass ext stretching  11/02/22 Bike level 5 x 6 minutes Gait around the back building no rest today just a little slower coming up the hill 35# leg curls both legs Leg extension 10# eccentrics on the left with cues to hold TKE 40# resisted gait with 6" step ups 40# resisted gait backwards Airex balance beam side stepping and tandem walking On airex ball toss Gait outside in grass, uneven surfaces and negotiating curbs Passive stretch into extension  10/28/22 Nustep level 5 x 5 minutes Bike level 5 x 5 minutes Gait around the back building good speed, one longer rest break  today was very short of breath Leg curls 35# 2x10 LEg extension 10# eccentrics left  Tmill push fwd and backward cues to extend the left knee Leg press 40# x10, no wieght working  Feet on ball bridges PROM left knee extension   PATIENT EDUCATION:  Education details: POC, HEP Person educated: Patient Education method: Explanation Education comprehension: verbalized understanding  HOME EXERCISE PROGRAM: Access Code: [email or web address]   ASSESSMENT:  CLINICAL IMPRESSION:.  Re-certified pt for 1 more month of PT at 1x/wk. Continued work on knee extension which is improving  a little at a time. Worked on decreasing tension in popliteus as a possible contributing factor to his decreased ROM. Focused on eccentric strengthening on steps.   OBJECTIVE IMPAIRMENTS: Abnormal gait, decreased activity tolerance, decreased balance, decreased coordination, difficulty walking, decreased ROM, decreased strength, impaired flexibility, and pain.     GOALS: Goals reviewed with patient? Yes  SHORT TERM GOALS: Target date: 08/23/22 I with initial HEP Baseline: Goal status: 09/14/22 met  2.  Increase R knee ROM to 5-100 Baseline:  Goal status:  still struggling with full extension against gravity today was 8-117 degrees flexion  3.  Increase R knee strength to 3+/5 Baseline:  Goal status: met 10/05/22  LONG TERM GOALS: Target date: 01/13/2023  I with final HEP Baseline:  Goal status: progressing 11/11/22  2.  Increase L knee strength to 5/5 Baseline:  Goal status: MET 10/13/22  3.  Increase L knee ROM to 0-120 Baseline:  Goal status: 8-117 degrees 11/25/22 4.  Patient will walk x at least 500' with LRAD, MI Baseline:  Goal status: progressing 09/30/22  MET 10/21/22  5.  Complete 5 x STS in < 12 seconds with equal WB through BLE Baseline:  Goal status: 09/14/22 progressing, 11.67s MET 10/13/22  6.  Complete TUG in < 12 sec with LRAD, MI Baseline:  Goal status: 09/14/22 progressing, 9.25s MET 10/13/22   7.  Able to go up and down stairs without difficulty added 11/30/22 Baseline:  Goal status: INITIAL PLAN:  PT FREQUENCY: 1x/week -- total of 4-5 more visits  PT DURATION: 6 weeks  PLANNED INTERVENTIONS: Therapeutic exercises, Therapeutic activity, Neuromuscular re-education, Balance training, Gait training, Patient/Family education, Self Care, Joint mobilization, Cryotherapy, Moist heat, scar mobilization, Taping, Vasopneumatic device, Ionotophoresis 4mg /ml Dexamethasone, and Manual therapy  PLAN FOR NEXT SESSION: continue to push his ROM try to get his extension  back. Next week we may need to ask for 1x/week for 4-5 weeks to try to maximize ROM and better his stairs and endurance, I added a stair goal and we still need full extension, will need to submit for further visits next visit  Aldan Camey April Ma L Chantele Corado, PT 12/02/2022, 2:33 PM

## 2022-12-16 ENCOUNTER — Ambulatory Visit: Payer: Medicare PPO | Attending: Family Medicine | Admitting: Physical Therapy

## 2022-12-16 DIAGNOSIS — R600 Localized edema: Secondary | ICD-10-CM | POA: Diagnosis not present

## 2022-12-16 DIAGNOSIS — R262 Difficulty in walking, not elsewhere classified: Secondary | ICD-10-CM | POA: Insufficient documentation

## 2022-12-16 DIAGNOSIS — M6281 Muscle weakness (generalized): Secondary | ICD-10-CM | POA: Diagnosis not present

## 2022-12-16 DIAGNOSIS — Z96652 Presence of left artificial knee joint: Secondary | ICD-10-CM | POA: Insufficient documentation

## 2022-12-16 NOTE — Therapy (Signed)
OUTPATIENT PHYSICAL THERAPY LOWER EXTREMITY    Patient Name: Joshua Ruiz MRN: [id number] DOB:1940/03/13, 82 y.o., male Today's Date: 12/16/2022  END OF SESSION:  PT End of Session - 12/16/22 1349     Visit Number 23    Number of Visits 27    Date for PT Re-Evaluation 01/13/23    Authorization Type [employer]    Authorization - Visit Number 23    Authorization - Number of Visits 27    PT Start Time 1345    PT Stop Time 1430    PT Time Calculation (min) 45 min    Activity Tolerance Patient tolerated treatment well    Behavior During Therapy WFL for tasks assessed/performed              Past Medical History:  Diagnosis Date   Cancer (HCC)    Skin cancer left ear and nose   Complication of anesthesia    Sinus Infection after back surgery 11/2021   GERD (gastroesophageal reflux disease)    Rheumatoid arthritis (HCC)    Past Surgical History:  Procedure Laterality Date   COLONOSCOPY  2021   EYE SURGERY Bilateral    cataract extraction   HEMORRHOID SURGERY     LAMINECTOMY WITH POSTERIOR LATERAL ARTHRODESIS LEVEL 3 N/A 11/20/2021   Procedure: LUMBAR TWO THROUGH LUMBAR THREE, LUMBAR THREE THROUGH LUMBAR FOUR, LUMBAR FOUR THROUGH LUMBAR FIVE LAMINECTOMY WITH FACETECTOMY, POSTERIOR SEGMENTAL INSTRUMENTATION, POSTEROLATERAL ARTHRODESIS;  Surgeon: Lisbeth Renshaw, MD;  Location: [facility] OR;  Service: Neurosurgery;  Laterality: N/A;  3C   LYMPH NODE BIOPSY     Neck   MOHS SURGERY Left 2016   Ear. [facility] in [place]   TOTAL KNEE ARTHROPLASTY Left 08/23/2022   Procedure: LEFT TOTAL KNEE REPLACEMENT;  Surgeon: Tarry Kos, MD;  Location: [facility] OR;  Service: Orthopedics;  Laterality: Left;   TRIGGER FINGER RELEASE Right    3rd   Patient Active Problem List   Diagnosis Date Noted   Numbness and tingling of fingertips 09/29/2022   Urinary retention 08/31/2022   Status post total left knee replacement 08/23/2022   Chronic rhinitis 02/15/2022   Eczema 02/15/2022    Spondylolisthesis of lumbar region 11/20/2021   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/2021   Lumbar spondylosis 10/20/2021   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-L5 level 10/20/2021   Primary osteoarthritis of left knee 08/14/2021   Chronic epididymitis 08/13/2020   Hives 07/11/2020   Rheumatoid arthritis (HCC) 07/11/2020   Cervical arthritis 07/11/2020   Gastroesophageal reflux disease 07/11/2020   Presbyopia of both eyes 04/20/2015   Pseudophakia of both eyes 04/20/2015    PCP: Loyola Mast, MD  REFERRING PROVIDER: Tarry Kos, MD  REFERRING DIAG:  Diagnosis  M17.12 (ICD-10-CM) - Primary osteoarthritis of left knee    THERAPY DIAG:  No diagnosis found.  Rationale for Evaluation and Treatment: Rehabilitation  ONSET DATE: 08/23/22  SUBJECTIVE:   SUBJECTIVE STATEMENT: Pt states he tweaked his back a little bit yesterday. Feeling it today. Reports he was able to go up/down a ladder.   PERTINENT HISTORY: RA, GERD, back surgery,  LTKR 08/23/22 ER visit 08/30/22 due to urinary retention  PAIN:  Are you having pain? Yes: NPRS scale: 0/10 Pain location: L knee Pain description: Muscle spasms Aggravating factors: fatigue Relieving factors: ice  PRECAUTIONS: Knee and Fall  RED FLAGS: None   WEIGHT BEARING RESTRICTIONS: Yes LLE WBAT  FALLS:  Has patient fallen in last 6 months? No  LIVING ENVIRONMENT: Lives with: lives with their  family and lives alone Lives in: House/apartment Stairs: No Has following equipment at home: Dan Humphreys - 2 wheeled, shower chair, and bed side commode  OCCUPATION: Retired, works out, likes to walk  PLOF: Independent  PATIENT GOALS: Return to his walking routine, less pain, recover his strength  NEXT MD VISIT: 4 weeks  OBJECTIVE:   DIAGNOSTIC FINDINGS:  Knee X ray /12/24 FINDINGS: Two-view show total knee arthroplasty. Components appear well positioned. No unexpected finding. Air and fluid in the operative region as  expected.   IMPRESSION: Good appearance following total knee arthroplasty.  COGNITION: Overall cognitive status: Within functional limits for tasks assessed     SENSATION: WFL  EDEMA:  Mod swelling in knee and lower leg, red and warm  POSTURE: rounded shoulders, forward head, and weight shift right  PALPATION: L knee tight and TTP with mild redness and swelling.  LOWER EXTREMITY ROM:  Passive ROM Right eval Left eval Left AROM 09/30/22 L AROM 10/13/22 Left PROM 10/28/22 Left AROM 11/11/22 Left AROM 11/30/22  Hip flexion         Hip extension         Hip abduction         Hip adduction         Hip internal rotation         Hip external rotation         Knee flexion  101 114 110  116 118  Knee extension  -20 12 8  0 P! 10 7  Ankle dorsiflexion         Ankle plantarflexion         Ankle inversion         Ankle eversion          (Blank rows = not tested)  LOWER EXTREMITY MMT: RLE 5/5  MMT Right eval Left eval Left  10/13/22  Hip flexion  3+ 5  Hip extension     Hip abduction     Hip adduction     Hip internal rotation     Hip external rotation  3/5   Knee flexion  3+ 5  Knee extension  3- 5  Ankle dorsiflexion     Ankle plantarflexion     Ankle inversion     Ankle eversion      (Blank rows = not tested)  FUNCTIONAL TESTS:  5 times sit to stand: TBD Timed up and go (TUG):   11/11/22 12 seconds  GAIT: Distance walked: In clinic distances Assistive device utilized: Walker - 2 wheeled Level of assistance: Modified independence Comments: Step to gait pattern, slow, cautious, antalgic on L.   TODAY'S TREATMENT:                                                                                                                              DATE: 12/16/22 Bike L4 x 5 min Prone knee hang x2 min with overpressure Prone knee hang + quad set x10  Single leg press 20# 2x10 Squat with heels on bar x10 Single leg squat with heels on bar 2x10 Heel raise on bar 2x10   Runner's step up 6" 2x10 without UE support Eccentric step down 6" 2x10 without UE support   12/02/22 Bike L4.5 x 5 min Prone knee hang x2 min, with overpressure for increased extension Prone knee hang with quad set x10 + PT overpressure Manual therapy: Popliteus muscle STM and muscle release Supine quad set with overpressure 2x10x5" Eccentric step down 4" step 2x10 (with and then without UE support) Eccentric side step down 4" step 2x10 (without UE support) Standing heel strike + quad set 2x10 (leaning against wall for balance) Standing stance phase with quad set 2x10  11/30/22 Nustep level 6 LE only x 3 minutes Bike level 5 x 3 minutes Gait outside around the back building no rest break TKE with ball behind knee  TKE with green tband behind knee Tmill push 20 seconds x4 Leg extension 10# eccentrics Prone leg hang SAQ Passive stretch into extension  11/25/22 Nustep level 5 x 5 minutes Bike level 4 x 5 mintues Tmill push fwd and back focus on extension Stairs 4" and 6" step over step Prone with leg hang for extension and use of the tgun on the HS Some prone QS and HS contract relax to gain extension Passive extension in supine Back to wall ball behind knee TKE Blue tband back to wall TKE  11/23/22 Bike x 5 min Forwards and backwards walking x 5 each at 5#, 10#, and 15# resistance around L knee Prone knee hang x2 min with massage gun to hamstring Contract/relax hamstring in prone 5x5 sec Prone quad set with prone knee hang 2x10 Supine quad set with over pressure 2x10 Supine SLR x10, with strap assist x10 Supine hip flexor stretch 2x30" Backwards walking 4x40' to work on terminal knee extension Forwards walking 4x40' working on heel strike with knee extension  11/18/22 Bike L4 x 5 min Standing gastroc stretch on slant board x30" Standing soleus stretch on slant board x30" Seated hamstring stretch 2x30" Quad set with 3 sec overpressure x10, no overpressure  x10 SLR using strap to keep extensor lag from occurring 2x10 Prone knee hang x2 min with overpressure Prone quad set x10 Manual therapy: IASTM hamstring with massage gun, PROM knee extension, grade II to III tibiofemoral mob for extension Backwards walking 4x50' to work on terminal knee extension  11/11/22 Bike level 4 x 5  minutes Gait around the back building one rest Sit to stand with weighted ball Leg extension 5# 2x10 verbal and tactile cues for TKE, one round of eccentrics 20# left only HS curls 2.5# SAQ cues fot quad Passive stretch focus on extension TUG 12 seconds  11/09/22 Nustep L 5 6 min Gait around the back building no rest today just a little slower coming up the hill and missing some heel strike and TKE on left  Gait outside in grass, uneven surfaces and negotiating curbs 35# leg curls both legs Leg extension 10# eccentrics on the left with cues to hold TKE 40# resisted gait with 6" step ups 40# resisted gait backwards TKE green tband 2 sets 10 Pass ext stretching  11/02/22 Bike level 5 x 6 minutes Gait around the back building no rest today just a little slower coming up the hill 35# leg curls both legs Leg extension 10# eccentrics on the left with cues to hold TKE 40# resisted gait with 6" step ups 40# resisted  gait backwards Airex balance beam side stepping and tandem walking On airex ball toss Gait outside in grass, uneven surfaces and negotiating curbs Passive stretch into extension  10/28/22 Nustep level 5 x 5 minutes Bike level 5 x 5 minutes Gait around the back building good speed, one longer rest break today was very short of breath Leg curls 35# 2x10 LEg extension 10# eccentrics left  Tmill push fwd and backward cues to extend the left knee Leg press 40# x10, no wieght working  Feet on ball bridges PROM left knee extension   PATIENT EDUCATION:  Education details: POC, HEP Person educated: Patient Education method: Explanation Education  comprehension: verbalized understanding  HOME EXERCISE PROGRAM: Access Code: [email or web address]   ASSESSMENT:  CLINICAL IMPRESSION:.  Treatment continues to focus on terminal knee extension and gross quad strengthening for improved stair ascent/descent.   OBJECTIVE IMPAIRMENTS: Abnormal gait, decreased activity tolerance, decreased balance, decreased coordination, difficulty walking, decreased ROM, decreased strength, impaired flexibility, and pain.     GOALS: Goals reviewed with patient? Yes  SHORT TERM GOALS: Target date: 08/23/22 I with initial HEP Baseline: Goal status: 09/14/22 met  2.  Increase R knee ROM to 5-100 Baseline:  Goal status:  still struggling with full extension against gravity today was 8-117 degrees flexion  3.  Increase R knee strength to 3+/5 Baseline:  Goal status: met 10/05/22  LONG TERM GOALS: Target date: 01/13/2023  I with final HEP Baseline:  Goal status: progressing 11/11/22  2.  Increase L knee strength to 5/5 Baseline:  Goal status: MET 10/13/22  3.  Increase L knee ROM to 0-120 Baseline:  Goal status: 8-117 degrees 11/25/22 4.  Patient will walk x at least 500' with LRAD, MI Baseline:  Goal status: progressing 09/30/22  MET 10/21/22  5.  Complete 5 x STS in < 12 seconds with equal WB through BLE Baseline:  Goal status: 09/14/22 progressing, 11.67s MET 10/13/22  6.  Complete TUG in < 12 sec with LRAD, MI Baseline:  Goal status: 09/14/22 progressing, 9.25s MET 10/13/22   7.  Able to go up and down stairs without difficulty added 11/30/22 Baseline:  Goal status: INITIAL PLAN:  PT FREQUENCY: 1x/week -- total of 4-5 more visits  PT DURATION: 6 weeks  PLANNED INTERVENTIONS: Therapeutic exercises, Therapeutic activity, Neuromuscular re-education, Balance training, Gait training, Patient/Family education, Self Care, Joint mobilization, Cryotherapy, Moist heat, scar mobilization, Taping, Vasopneumatic device, Ionotophoresis 4mg /ml Dexamethasone, and  Manual therapy  PLAN FOR NEXT SESSION: continue to push his ROM try to get his extension back. Better his stairs and endurance, I added a stair goal and we still need full extension  Emberlyn Burlison April Ma L Izzabella Besse, PT 12/16/2022, 1:49 PM

## 2022-12-23 ENCOUNTER — Ambulatory Visit: Payer: Medicare PPO | Admitting: Physical Therapy

## 2022-12-23 DIAGNOSIS — M6281 Muscle weakness (generalized): Secondary | ICD-10-CM | POA: Diagnosis not present

## 2022-12-23 DIAGNOSIS — R262 Difficulty in walking, not elsewhere classified: Secondary | ICD-10-CM

## 2022-12-23 DIAGNOSIS — R600 Localized edema: Secondary | ICD-10-CM | POA: Diagnosis not present

## 2022-12-23 DIAGNOSIS — Z96652 Presence of left artificial knee joint: Secondary | ICD-10-CM | POA: Diagnosis not present

## 2022-12-23 NOTE — Therapy (Signed)
OUTPATIENT PHYSICAL THERAPY LOWER EXTREMITY    Patient Name: Joshua Ruiz MRN: [id number] DOB:12-25-1940, 82 y.o., male Today's Date: 12/23/2022  END OF SESSION:  PT End of Session - 12/23/22 1340     Visit Number 24    Number of Visits 27    Date for PT Re-Evaluation 01/13/23    Authorization Type [employer]    Authorization - Visit Number 24    Authorization - Number of Visits 27    PT Start Time 1340    PT Stop Time 1420    PT Time Calculation (min) 40 min    Activity Tolerance Patient tolerated treatment well    Behavior During Therapy WFL for tasks assessed/performed               Past Medical History:  Diagnosis Date   Cancer (HCC)    Skin cancer left ear and nose   Complication of anesthesia    Sinus Infection after back surgery 11/2021   GERD (gastroesophageal reflux disease)    Rheumatoid arthritis (HCC)    Past Surgical History:  Procedure Laterality Date   COLONOSCOPY  2021   EYE SURGERY Bilateral    cataract extraction   HEMORRHOID SURGERY     LAMINECTOMY WITH POSTERIOR LATERAL ARTHRODESIS LEVEL 3 N/A 11/20/2021   Procedure: LUMBAR TWO THROUGH LUMBAR THREE, LUMBAR THREE THROUGH LUMBAR FOUR, LUMBAR FOUR THROUGH LUMBAR FIVE LAMINECTOMY WITH FACETECTOMY, POSTERIOR SEGMENTAL INSTRUMENTATION, POSTEROLATERAL ARTHRODESIS;  Surgeon: Lisbeth Renshaw, MD;  Location: [facility] OR;  Service: Neurosurgery;  Laterality: N/A;  3C   LYMPH NODE BIOPSY     Neck   MOHS SURGERY Left 2016   Ear. [facility] in [place]   TOTAL KNEE ARTHROPLASTY Left 08/23/2022   Procedure: LEFT TOTAL KNEE REPLACEMENT;  Surgeon: Tarry Kos, MD;  Location: [facility] OR;  Service: Orthopedics;  Laterality: Left;   TRIGGER FINGER RELEASE Right    3rd   Patient Active Problem List   Diagnosis Date Noted   Numbness and tingling of fingertips 09/29/2022   Urinary retention 08/31/2022   Status post total left knee replacement 08/23/2022   Chronic rhinitis 02/15/2022   Eczema 02/15/2022    Spondylolisthesis of lumbar region 11/20/2021   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/2021   Lumbar spondylosis 10/20/2021   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-L5 level 10/20/2021   Primary osteoarthritis of left knee 08/14/2021   Chronic epididymitis 08/13/2020   Hives 07/11/2020   Rheumatoid arthritis (HCC) 07/11/2020   Cervical arthritis 07/11/2020   Gastroesophageal reflux disease 07/11/2020   Presbyopia of both eyes 04/20/2015   Pseudophakia of both eyes 04/20/2015    PCP: Loyola Mast, MD  REFERRING PROVIDER: Tarry Kos, MD  REFERRING DIAG:  Diagnosis  M17.12 (ICD-10-CM) - Primary osteoarthritis of left knee    THERAPY DIAG:  S/P TKR (total knee replacement), left  Muscle weakness (generalized)  Difficulty in walking, not elsewhere classified  Rationale for Evaluation and Treatment: Rehabilitation  ONSET DATE: 08/23/22  SUBJECTIVE:   SUBJECTIVE STATEMENT: Nothing new or different. Back is feeling better.   PERTINENT HISTORY: RA, GERD, back surgery,  LTKR 08/23/22 ER visit 08/30/22 due to urinary retention  PAIN:  Are you having pain? Yes: NPRS scale: 0/10 Pain location: L knee Pain description: Muscle spasms Aggravating factors: fatigue Relieving factors: ice  PRECAUTIONS: Knee and Fall  RED FLAGS: None   WEIGHT BEARING RESTRICTIONS: Yes LLE WBAT  FALLS:  Has patient fallen in last 6 months? No  LIVING ENVIRONMENT: Lives with: lives with  their family and lives alone Lives in: House/apartment Stairs: No Has following equipment at home: Dan Humphreys - 2 wheeled, shower chair, and bed side commode  OCCUPATION: Retired, works out, likes to walk  PLOF: Independent  PATIENT GOALS: Return to his walking routine, less pain, recover his strength  NEXT MD VISIT: 4 weeks  OBJECTIVE:   DIAGNOSTIC FINDINGS:  Knee X ray /12/24 FINDINGS: Two-view show total knee arthroplasty. Components appear well positioned. No unexpected finding. Air and fluid  in the operative region as expected.   IMPRESSION: Good appearance following total knee arthroplasty.  COGNITION: Overall cognitive status: Within functional limits for tasks assessed     SENSATION: WFL  EDEMA:  Mod swelling in knee and lower leg, red and warm  POSTURE: rounded shoulders, forward head, and weight shift right  PALPATION: L knee tight and TTP with mild redness and swelling.  LOWER EXTREMITY ROM:  Passive ROM Right eval Left eval Left AROM 09/30/22 L AROM 10/13/22 Left PROM 10/28/22 Left AROM 11/11/22 Left AROM 11/30/22  Hip flexion         Hip extension         Hip abduction         Hip adduction         Hip internal rotation         Hip external rotation         Knee flexion  101 114 110  116 118  Knee extension  -20 12 8  0 P! 10 7  Ankle dorsiflexion         Ankle plantarflexion         Ankle inversion         Ankle eversion          (Blank rows = not tested)  LOWER EXTREMITY MMT: RLE 5/5  MMT Right eval Left eval Left  10/13/22  Hip flexion  3+ 5  Hip extension     Hip abduction     Hip adduction     Hip internal rotation     Hip external rotation  3/5   Knee flexion  3+ 5  Knee extension  3- 5  Ankle dorsiflexion     Ankle plantarflexion     Ankle inversion     Ankle eversion      (Blank rows = not tested)  FUNCTIONAL TESTS:  5 times sit to stand: TBD Timed up and go (TUG):   11/11/22 12 seconds  GAIT: Distance walked: In clinic distances Assistive device utilized: Walker - 2 wheeled Level of assistance: Modified independence Comments: Step to gait pattern, slow, cautious, antalgic on L.   TODAY'S TREATMENT:                                                                                                                              DATE: 12/23/22 Treadmill 1.2 mph x 5 min with focus on even step length and extending L knee to keep  R toe from dragging On decline, single leg mini squat /terminal extension 2x10 On decline,  double leg terminal ext 2x10 Foot on incline, hip flexor/gastroc stretch x30" R&L Supine hip flexor stretch x30" Supine feet on pball, knees extended bridging 2x10, single leg x10 Supine quad set with PT overpressure for knee ext 2x10 Prone knee hang with PT overpressure and STM along posterior knee 2x10 Eccentric step down 6" 2x10 with intermittent UE support Ascend/descend steps x 5 reps without UE support  12/16/22 Bike L4 x 5 min Prone knee hang x2 min with overpressure Prone knee hang + quad set x10 Single leg press 20# 2x10 Squat with heels on bar x10 Single leg squat with heels on bar 2x10 Heel raise on bar 2x10  Runner's step up 6" 2x10 without UE support Eccentric step down 6" 2x10 without UE support  12/02/22 Bike L4.5 x 5 min Prone knee hang x2 min, with overpressure for increased extension Prone knee hang with quad set x10 + PT overpressure Manual therapy: Popliteus muscle STM and muscle release Supine quad set with overpressure 2x10x5" Eccentric step down 4" step 2x10 (with and then without UE support) Eccentric side step down 4" step 2x10 (without UE support) Standing heel strike + quad set 2x10 (leaning against wall for balance) Standing stance phase with quad set 2x10  11/30/22 Nustep level 6 LE only x 3 minutes Bike level 5 x 3 minutes Gait outside around the back building no rest break TKE with ball behind knee  TKE with green tband behind knee Tmill push 20 seconds x4 Leg extension 10# eccentrics Prone leg hang SAQ Passive stretch into extension  11/25/22 Nustep level 5 x 5 minutes Bike level 4 x 5 mintues Tmill push fwd and back focus on extension Stairs 4" and 6" step over step Prone with leg hang for extension and use of the tgun on the HS Some prone QS and HS contract relax to gain extension Passive extension in supine Back to wall ball behind knee TKE Blue tband back to wall TKE  11/23/22 Bike x 5 min Forwards and backwards walking x 5  each at 5#, 10#, and 15# resistance around L knee Prone knee hang x2 min with massage gun to hamstring Contract/relax hamstring in prone 5x5 sec Prone quad set with prone knee hang 2x10 Supine quad set with over pressure 2x10 Supine SLR x10, with strap assist x10 Supine hip flexor stretch 2x30" Backwards walking 4x40' to work on terminal knee extension Forwards walking 4x40' working on heel strike with knee extension  11/18/22 Bike L4 x 5 min Standing gastroc stretch on slant board x30" Standing soleus stretch on slant board x30" Seated hamstring stretch 2x30" Quad set with 3 sec overpressure x10, no overpressure x10 SLR using strap to keep extensor lag from occurring 2x10 Prone knee hang x2 min with overpressure Prone quad set x10 Manual therapy: IASTM hamstring with massage gun, PROM knee extension, grade II to III tibiofemoral mob for extension Backwards walking 4x50' to work on terminal knee extension  11/11/22 Bike level 4 x 5  minutes Gait around the back building one rest Sit to stand with weighted ball Leg extension 5# 2x10 verbal and tactile cues for TKE, one round of eccentrics 20# left only HS curls 2.5# SAQ cues fot quad Passive stretch focus on extension TUG 12 seconds  11/09/22 Nustep L 5 6 min Gait around the back building no rest today just a little slower coming up the hill and missing some  heel strike and TKE on left  Gait outside in grass, uneven surfaces and negotiating curbs 35# leg curls both legs Leg extension 10# eccentrics on the left with cues to hold TKE 40# resisted gait with 6" step ups 40# resisted gait backwards TKE green tband 2 sets 10 Pass ext stretching  11/02/22 Bike level 5 x 6 minutes Gait around the back building no rest today just a little slower coming up the hill 35# leg curls both legs Leg extension 10# eccentrics on the left with cues to hold TKE 40# resisted gait with 6" step ups 40# resisted gait backwards Airex balance  beam side stepping and tandem walking On airex ball toss Gait outside in grass, uneven surfaces and negotiating curbs Passive stretch into extension  10/28/22 Nustep level 5 x 5 minutes Bike level 5 x 5 minutes Gait around the back building good speed, one longer rest break today was very short of breath Leg curls 35# 2x10 LEg extension 10# eccentrics left  Tmill push fwd and backward cues to extend the left knee Leg press 40# x10, no wieght working  Feet on ball bridges PROM left knee extension   PATIENT EDUCATION:  Education details: POC, HEP Person educated: Patient Education method: Explanation Education comprehension: verbalized understanding  HOME EXERCISE PROGRAM: Access Code: [email or web address]   ASSESSMENT:  CLINICAL IMPRESSION:.  Still trying to work on full knee extension. Pt remains at about 5 deg from 0. Strength has continued to improve with less LOBs with eccentric step downs and step ups. Able to ascend/descend with reciprocal pattern.   OBJECTIVE IMPAIRMENTS: Abnormal gait, decreased activity tolerance, decreased balance, decreased coordination, difficulty walking, decreased ROM, decreased strength, impaired flexibility, and pain.     GOALS: Goals reviewed with patient? Yes  SHORT TERM GOALS: Target date: 08/23/22 I with initial HEP Baseline: Goal status: 09/14/22 met  2.  Increase R knee ROM to 5-100 Baseline:  Goal status:  still struggling with full extension against gravity today was 8-117 degrees flexion  3.  Increase R knee strength to 3+/5 Baseline:  Goal status: met 10/05/22  LONG TERM GOALS: Target date: 01/13/2023  I with final HEP Baseline:  Goal status: progressing 11/11/22  2.  Increase L knee strength to 5/5 Baseline:  Goal status: MET 10/13/22  3.  Increase L knee ROM to 0-120 Baseline:  Goal status: 8-117 degrees 11/25/22  4.  Patient will walk x at least 500' with LRAD, MI Baseline:  Goal status: progressing 09/30/22  MET  10/21/22  5.  Complete 5 x STS in < 12 seconds with equal WB through BLE Baseline:  Goal status: 09/14/22 progressing, 11.67s MET 10/13/22  6.  Complete TUG in < 12 sec with LRAD, MI Baseline:  Goal status: 09/14/22 progressing, 9.25s MET 10/13/22   7.  Able to go up and down stairs without difficulty added 11/30/22 Baseline:  Goal status: INITIAL PLAN:  PT FREQUENCY: 1x/week -- total of 4-5 more visits  PT DURATION: 6 weeks  PLANNED INTERVENTIONS: Therapeutic exercises, Therapeutic activity, Neuromuscular re-education, Balance training, Gait training, Patient/Family education, Self Care, Joint mobilization, Cryotherapy, Moist heat, scar mobilization, Taping, Vasopneumatic device, Ionotophoresis 4mg /ml Dexamethasone, and Manual therapy  PLAN FOR NEXT SESSION: continue to push his ROM try to get his extension back. Better his stairs and endurance, I added a stair goal and we still need full extension  Jamyria Ozanich April Ma L Kamaree Wheatley, PT 12/23/2022, 1:41 PM

## 2022-12-30 ENCOUNTER — Ambulatory Visit: Payer: Medicare PPO | Admitting: Physical Therapy

## 2022-12-30 DIAGNOSIS — R600 Localized edema: Secondary | ICD-10-CM

## 2022-12-30 DIAGNOSIS — M6281 Muscle weakness (generalized): Secondary | ICD-10-CM | POA: Diagnosis not present

## 2022-12-30 DIAGNOSIS — Z96652 Presence of left artificial knee joint: Secondary | ICD-10-CM

## 2022-12-30 DIAGNOSIS — R262 Difficulty in walking, not elsewhere classified: Secondary | ICD-10-CM

## 2022-12-30 NOTE — Therapy (Signed)
OUTPATIENT PHYSICAL THERAPY LOWER EXTREMITY    Patient Name: Joshua Ruiz MRN: [id number] DOB:[birth year], 82 y.o., male Today's Date: 12/30/2022  END OF SESSION:  PT End of Session - 12/30/22 1343     Visit Number 25    Number of Visits 27    Date for PT Re-Evaluation 01/13/23    Authorization Type [employer]    Authorization - Number of Visits 27    PT Start Time 1345    PT Stop Time 1430    PT Time Calculation (min) 45 min    Activity Tolerance Patient tolerated treatment well    Behavior During Therapy WFL for tasks assessed/performed              Past Medical History:  Diagnosis Date   Cancer (HCC)    Skin cancer left ear and nose   Complication of anesthesia    Sinus Infection after back surgery 11/2021   GERD (gastroesophageal reflux disease)    Rheumatoid arthritis (HCC)    Past Surgical History:  Procedure Laterality Date   COLONOSCOPY  2021   EYE SURGERY Bilateral    cataract extraction   HEMORRHOID SURGERY     LAMINECTOMY WITH POSTERIOR LATERAL ARTHRODESIS LEVEL 3 N/A 11/20/2021   Procedure: LUMBAR TWO THROUGH LUMBAR THREE, LUMBAR THREE THROUGH LUMBAR FOUR, LUMBAR FOUR THROUGH LUMBAR FIVE LAMINECTOMY WITH FACETECTOMY, POSTERIOR SEGMENTAL INSTRUMENTATION, POSTEROLATERAL ARTHRODESIS;  Surgeon: Lisbeth Renshaw, MD;  Location: [facility] OR;  Service: Neurosurgery;  Laterality: N/A;  3C   LYMPH NODE BIOPSY     Neck   MOHS SURGERY Left 2016   Ear. [facility] in [place]   TOTAL KNEE ARTHROPLASTY Left 08/23/2022   Procedure: LEFT TOTAL KNEE REPLACEMENT;  Surgeon: Tarry Kos, MD;  Location: [facility] OR;  Service: Orthopedics;  Laterality: Left;   TRIGGER FINGER RELEASE Right    3rd   Patient Active Problem List   Diagnosis Date Noted   Numbness and tingling of fingertips 09/29/2022   Urinary retention 08/31/2022   Status post total left knee replacement 08/23/2022   Chronic rhinitis 02/15/2022   Eczema 02/15/2022   Spondylolisthesis of lumbar region  11/20/2021   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/2021   Lumbar spondylosis 10/20/2021   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-L5 level 10/20/2021   Primary osteoarthritis of left knee 08/14/2021   Chronic epididymitis 08/13/2020   Hives 07/11/2020   Rheumatoid arthritis (HCC) 07/11/2020   Cervical arthritis 07/11/2020   Gastroesophageal reflux disease 07/11/2020   Presbyopia of both eyes 04/20/2015   Pseudophakia of both eyes 04/20/2015    PCP: Loyola Mast, MD  REFERRING PROVIDER: Tarry Kos, MD  REFERRING DIAG:  Diagnosis  M17.12 (ICD-10-CM) - Primary osteoarthritis of left knee    THERAPY DIAG:  No diagnosis found.  Rationale for Evaluation and Treatment: Rehabilitation  ONSET DATE: 08/23/22  SUBJECTIVE:   SUBJECTIVE STATEMENT: Pt reports no new complaints.   PERTINENT HISTORY: RA, GERD, back surgery,  LTKR 08/23/22 ER visit 08/30/22 due to urinary retention  PAIN:  Are you having pain? Yes: NPRS scale: 0/10 Pain location: L knee Pain description: Muscle spasms Aggravating factors: fatigue Relieving factors: ice  PRECAUTIONS: Knee and Fall  RED FLAGS: None   WEIGHT BEARING RESTRICTIONS: Yes LLE WBAT  FALLS:  Has patient fallen in last 6 months? No  LIVING ENVIRONMENT: Lives with: lives with their family and lives alone Lives in: House/apartment Stairs: No Has following equipment at home: Dan Humphreys - 2 wheeled, shower chair, and bed side commode  OCCUPATION: Retired, works out, likes to walk  PLOF: Independent  PATIENT GOALS: Return to his walking routine, less pain, recover his strength  NEXT MD VISIT: 4 weeks  OBJECTIVE:   DIAGNOSTIC FINDINGS:  Knee X ray /12/24 FINDINGS: Two-view show total knee arthroplasty. Components appear well positioned. No unexpected finding. Air and fluid in the operative region as expected.   IMPRESSION: Good appearance following total knee arthroplasty.  COGNITION: Overall cognitive status: Within  functional limits for tasks assessed     SENSATION: WFL  EDEMA:  Mod swelling in knee and lower leg, red and warm  POSTURE: rounded shoulders, forward head, and weight shift right  PALPATION: L knee tight and TTP with mild redness and swelling.  LOWER EXTREMITY ROM:  Passive ROM Right eval Left eval Left AROM 09/30/22 L AROM 10/13/22 Left PROM 10/28/22 Left AROM 11/11/22 Left AROM 11/30/22  Hip flexion         Hip extension         Hip abduction         Hip adduction         Hip internal rotation         Hip external rotation         Knee flexion  101 114 110  116 118  Knee extension  -20 12 8  0 P! 10 7  Ankle dorsiflexion         Ankle plantarflexion         Ankle inversion         Ankle eversion          (Blank rows = not tested)  LOWER EXTREMITY MMT: RLE 5/5  MMT Right eval Left eval Left  10/13/22  Hip flexion  3+ 5  Hip extension     Hip abduction     Hip adduction     Hip internal rotation     Hip external rotation  3/5   Knee flexion  3+ 5  Knee extension  3- 5  Ankle dorsiflexion     Ankle plantarflexion     Ankle inversion     Ankle eversion      (Blank rows = not tested)  FUNCTIONAL TESTS:  5 times sit to stand: TBD Timed up and go (TUG):   11/11/22 12 seconds  GAIT: Distance walked: In clinic distances Assistive device utilized: Environmental consultant - 2 wheeled Level of assistance: Modified independence Comments: Step to gait pattern, slow, cautious, antalgic on L.   TODAY'S TREATMENT:                                                                                                                              DATE: 12/30/22 Treadmill 0.9 mph x 4 min backwards walking, 1.0 mph x 2 min forwards walking Gastroc/soleus stretch with toes on bar with quad setting 2x10 SL heel/toe raise 2x10 Supine quad set 3x10 with PT overpressure PT manual stretch/overpressure for knee ext 2x30" Manual  hamstring stretch with contract/relax 2x30" Runner's step up 6"  2x10 Eccentric step down 6" x10 with 1 hand hold, 4" x10 without UE support BOSU squats with UE support 2x10 BOSU static balance 2x30" Blue side of bosu ankle PF/DF with knees extended 2x10 Blue side of bosu step up and over 2x10   12/23/22 Treadmill 1.2 mph x 5 min with focus on even step length and extending L knee to keep R toe from dragging On decline, single leg mini squat /terminal extension 2x10 On decline, double leg terminal ext 2x10 Foot on incline, hip flexor/gastroc stretch x30" R&L Supine hip flexor stretch x30" Supine feet on pball, knees extended bridging 2x10, single leg x10 Supine quad set with PT overpressure for knee ext 2x10 Prone knee hang with PT overpressure and STM along posterior knee 2x10 Eccentric step down 6" 2x10 with intermittent UE support Ascend/descend steps x 5 reps without UE support  12/16/22 Bike L4 x 5 min Prone knee hang x2 min with overpressure Prone knee hang + quad set x10 Single leg press 20# 2x10 Squat with heels on bar x10 Single leg squat with heels on bar 2x10 Heel raise on bar 2x10  Runner's step up 6" 2x10 without UE support Eccentric step down 6" 2x10 without UE support  12/02/22 Bike L4.5 x 5 min Prone knee hang x2 min, with overpressure for increased extension Prone knee hang with quad set x10 + PT overpressure Manual therapy: Popliteus muscle STM and muscle release Supine quad set with overpressure 2x10x5" Eccentric step down 4" step 2x10 (with and then without UE support) Eccentric side step down 4" step 2x10 (without UE support) Standing heel strike + quad set 2x10 (leaning against wall for balance) Standing stance phase with quad set 2x10  11/30/22 Nustep level 6 LE only x 3 minutes Bike level 5 x 3 minutes Gait outside around the back building no rest break TKE with ball behind knee  TKE with green tband behind knee Tmill push 20 seconds x4 Leg extension 10# eccentrics Prone leg hang SAQ Passive stretch into  extension  11/25/22 Nustep level 5 x 5 minutes Bike level 4 x 5 mintues Tmill push fwd and back focus on extension Stairs 4" and 6" step over step Prone with leg hang for extension and use of the tgun on the HS Some prone QS and HS contract relax to gain extension Passive extension in supine Back to wall ball behind knee TKE Blue tband back to wall TKE  11/23/22 Bike x 5 min Forwards and backwards walking x 5 each at 5#, 10#, and 15# resistance around L knee Prone knee hang x2 min with massage gun to hamstring Contract/relax hamstring in prone 5x5 sec Prone quad set with prone knee hang 2x10 Supine quad set with over pressure 2x10 Supine SLR x10, with strap assist x10 Supine hip flexor stretch 2x30" Backwards walking 4x40' to work on terminal knee extension Forwards walking 4x40' working on heel strike with knee extension  11/18/22 Bike L4 x 5 min Standing gastroc stretch on slant board x30" Standing soleus stretch on slant board x30" Seated hamstring stretch 2x30" Quad set with 3 sec overpressure x10, no overpressure x10 SLR using strap to keep extensor lag from occurring 2x10 Prone knee hang x2 min with overpressure Prone quad set x10 Manual therapy: IASTM hamstring with massage gun, PROM knee extension, grade II to III tibiofemoral mob for extension Backwards walking 4x50' to work on terminal knee extension  11/11/22 Bike level 4 x  5  minutes Gait around the back building one rest Sit to stand with weighted ball Leg extension 5# 2x10 verbal and tactile cues for TKE, one round of eccentrics 20# left only HS curls 2.5# SAQ cues fot quad Passive stretch focus on extension TUG 12 seconds  11/09/22 Nustep L 5 6 min Gait around the back building no rest today just a little slower coming up the hill and missing some heel strike and TKE on left  Gait outside in grass, uneven surfaces and negotiating curbs 35# leg curls both legs Leg extension 10# eccentrics on the left  with cues to hold TKE 40# resisted gait with 6" step ups 40# resisted gait backwards TKE green tband 2 sets 10 Pass ext stretching  11/02/22 Bike level 5 x 6 minutes Gait around the back building no rest today just a little slower coming up the hill 35# leg curls both legs Leg extension 10# eccentrics on the left with cues to hold TKE 40# resisted gait with 6" step ups 40# resisted gait backwards Airex balance beam side stepping and tandem walking On airex ball toss Gait outside in grass, uneven surfaces and negotiating curbs Passive stretch into extension  10/28/22 Nustep level 5 x 5 minutes Bike level 5 x 5 minutes Gait around the back building good speed, one longer rest break today was very short of breath Leg curls 35# 2x10 LEg extension 10# eccentrics left  Tmill push fwd and backward cues to extend the left knee Leg press 40# x10, no wieght working  Feet on ball bridges PROM left knee extension   PATIENT EDUCATION:  Education details: POC, HEP Person educated: Patient Education method: Explanation Education comprehension: verbalized understanding  HOME EXERCISE PROGRAM: Access Code: [email or web address]   ASSESSMENT:  CLINICAL IMPRESSION:.  Still trying to work on full knee extension. Continued stretching hamstring and gastroc to decrease muscle tension around knee. Pt with improving stair ascent/descent. Session added on stepping/balance on BOSU.   OBJECTIVE IMPAIRMENTS: Abnormal gait, decreased activity tolerance, decreased balance, decreased coordination, difficulty walking, decreased ROM, decreased strength, impaired flexibility, and pain.     GOALS: Goals reviewed with patient? Yes  SHORT TERM GOALS: Target date: 08/23/22 I with initial HEP Baseline: Goal status: 09/14/22 met  2.  Increase R knee ROM to 5-100 Baseline:  Goal status:  still struggling with full extension against gravity today was 8-117 degrees flexion  3.  Increase R knee strength to  3+/5 Baseline:  Goal status: met 10/05/22  LONG TERM GOALS: Target date: 01/13/2023  I with final HEP Baseline:  Goal status: progressing 11/11/22  2.  Increase L knee strength to 5/5 Baseline:  Goal status: MET 10/13/22  3.  Increase L knee ROM to 0-120 Baseline:  Goal status: 8-117 degrees 11/25/22  4.  Patient will walk x at least 500' with LRAD, MI Baseline:  Goal status: progressing 09/30/22  MET 10/21/22  5.  Complete 5 x STS in < 12 seconds with equal WB through BLE Baseline:  Goal status: 09/14/22 progressing, 11.67s MET 10/13/22  6.  Complete TUG in < 12 sec with LRAD, MI Baseline:  Goal status: 09/14/22 progressing, 9.25s MET 10/13/22   7.  Able to go up and down stairs without difficulty added 11/30/22 Baseline:  Goal status: INITIAL PLAN:  PT FREQUENCY: 1x/week -- total of 4-5 more visits  PT DURATION: 6 weeks  PLANNED INTERVENTIONS: Therapeutic exercises, Therapeutic activity, Neuromuscular re-education, Balance training, Gait training, Patient/Family education, Self Care, Joint mobilization, Cryotherapy,  Moist heat, scar mobilization, Taping, Vasopneumatic device, Ionotophoresis 4mg /ml Dexamethasone, and Manual therapy  PLAN FOR NEXT SESSION: continue to push his ROM try to get his extension back. Better his stairs and endurance, I added a stair goal and we still need full extension  Shimshon Narula April Ruiz L Blakelynn Scheeler, PT 12/30/2022, 1:43 PM

## 2023-01-06 ENCOUNTER — Encounter: Payer: Self-pay | Admitting: Physical Therapy

## 2023-01-06 ENCOUNTER — Ambulatory Visit: Payer: Medicare PPO | Admitting: Physical Therapy

## 2023-01-06 DIAGNOSIS — Z96652 Presence of left artificial knee joint: Secondary | ICD-10-CM

## 2023-01-06 DIAGNOSIS — R262 Difficulty in walking, not elsewhere classified: Secondary | ICD-10-CM | POA: Diagnosis not present

## 2023-01-06 DIAGNOSIS — M6281 Muscle weakness (generalized): Secondary | ICD-10-CM | POA: Diagnosis not present

## 2023-01-06 DIAGNOSIS — R600 Localized edema: Secondary | ICD-10-CM | POA: Diagnosis not present

## 2023-01-06 NOTE — Therapy (Signed)
OUTPATIENT PHYSICAL THERAPY LOWER EXTREMITY    Patient Name: Joshua Ruiz MRN: [id number] DOB:[birth year], 82 y.o., male Today's Date: 01/06/2023  END OF SESSION:  PT End of Session - 01/06/23 1356     Visit Number 26    Number of Visits 27    Date for PT Re-Evaluation 01/13/23    Authorization Type [employer]    PT Start Time 1354    PT Stop Time 1439    PT Time Calculation (min) 45 min    Activity Tolerance Patient tolerated treatment well    Behavior During Therapy WFL for tasks assessed/performed              Past Medical History:  Diagnosis Date   Cancer (HCC)    Skin cancer left ear and nose   Complication of anesthesia    Sinus Infection after back surgery 11/2021   GERD (gastroesophageal reflux disease)    Rheumatoid arthritis (HCC)    Past Surgical History:  Procedure Laterality Date   COLONOSCOPY  2021   EYE SURGERY Bilateral    cataract extraction   HEMORRHOID SURGERY     LAMINECTOMY WITH POSTERIOR LATERAL ARTHRODESIS LEVEL 3 N/A 11/20/2021   Procedure: LUMBAR TWO THROUGH LUMBAR THREE, LUMBAR THREE THROUGH LUMBAR FOUR, LUMBAR FOUR THROUGH LUMBAR FIVE LAMINECTOMY WITH FACETECTOMY, POSTERIOR SEGMENTAL INSTRUMENTATION, POSTEROLATERAL ARTHRODESIS;  Surgeon: Lisbeth Renshaw, MD;  Location: [facility];  Service: Neurosurgery;  Laterality: N/A;  3C   LYMPH NODE BIOPSY     Neck   MOHS SURGERY Left 2016   Ear. [facility] in [place]   TOTAL KNEE ARTHROPLASTY Left 08/23/2022   Procedure: LEFT TOTAL KNEE REPLACEMENT;  Surgeon: Tarry Kos, MD;  Location: [facility] OR;  Service: Orthopedics;  Laterality: Left;   TRIGGER FINGER RELEASE Right    3rd   Patient Active Problem List   Diagnosis Date Noted   Numbness and tingling of fingertips 09/29/2022   Urinary retention 08/31/2022   Status post total left knee replacement 08/23/2022   Chronic rhinitis 02/15/2022   Eczema 02/15/2022   Spondylolisthesis of lumbar region 11/20/2021   Lumbar stenosis with  neurogenic claudication 10/20/2021   Lumbar spondylosis 10/20/2021   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-L5 level 10/20/2021   Primary osteoarthritis of left knee 08/14/2021   Chronic epididymitis 08/13/2020   Hives 07/11/2020   Rheumatoid arthritis (HCC) 07/11/2020   Cervical arthritis 07/11/2020   Gastroesophageal reflux disease 07/11/2020   Presbyopia of both eyes 04/20/2015   Pseudophakia of both eyes 04/20/2015    PCP: Loyola Mast, MD  REFERRING PROVIDER: Tarry Kos, MD  REFERRING DIAG:  Diagnosis  M17.12 (ICD-10-CM) - Primary osteoarthritis of left knee    THERAPY DIAG:  S/P TKR (total knee replacement), left  Muscle weakness (generalized)  Difficulty in walking, not elsewhere classified  Localized edema  Rationale for Evaluation and Treatment: Rehabilitation  ONSET DATE: 08/23/22  SUBJECTIVE:   SUBJECTIVE STATEMENT: I am overall doing well, just a "clunk' I feel and hear at times when walking  PERTINENT HISTORY: RA, GERD, back surgery,  LTKR 08/23/22 ER visit 08/30/22 due to urinary retention  PAIN:  Are you having pain? Yes: NPRS scale: 0/10 Pain location: L knee Pain description: Muscle spasms Aggravating factors: fatigue Relieving factors: ice  PRECAUTIONS: Knee and Fall  RED FLAGS: None   WEIGHT BEARING RESTRICTIONS: Yes LLE WBAT  FALLS:  Has patient fallen in last 6 months? No  LIVING ENVIRONMENT: Lives with: lives with their family and lives alone Lives in: House/apartment  Stairs: No Has following equipment at home: Dan Humphreys - 2 wheeled, shower chair, and bed side commode  OCCUPATION: Retired, works out, likes to walk  PLOF: Independent  PATIENT GOALS: Return to his walking routine, less pain, recover his strength  NEXT MD VISIT: 4 weeks  OBJECTIVE:   DIAGNOSTIC FINDINGS:  Knee X ray /12/24 FINDINGS: Two-view show total knee arthroplasty. Components appear well positioned. No unexpected finding. Air and fluid in the operative region  as expected.   IMPRESSION: Good appearance following total knee arthroplasty.  COGNITION: Overall cognitive status: Within functional limits for tasks assessed     SENSATION: WFL  EDEMA:  Mod swelling in knee and lower leg, red and warm  POSTURE: rounded shoulders, forward head, and weight shift right  PALPATION: L knee tight and TTP with mild redness and swelling.  LOWER EXTREMITY ROM:  Passive ROM Right eval Left eval Left AROM 09/30/22 L AROM 10/13/22 Left PROM 10/28/22 Left AROM 11/11/22 Left AROM 11/30/22  Hip flexion         Hip extension         Hip abduction         Hip adduction         Hip internal rotation         Hip external rotation         Knee flexion  101 114 110  116 118  Knee extension  -20 12 8  0 P! 10 7  Ankle dorsiflexion         Ankle plantarflexion         Ankle inversion         Ankle eversion          (Blank rows = not tested)  LOWER EXTREMITY MMT: RLE 5/5  MMT Right eval Left eval Left  10/13/22  Hip flexion  3+ 5  Hip extension     Hip abduction     Hip adduction     Hip internal rotation     Hip external rotation  3/5   Knee flexion  3+ 5  Knee extension  3- 5  Ankle dorsiflexion     Ankle plantarflexion     Ankle inversion     Ankle eversion      (Blank rows = not tested)  FUNCTIONAL TESTS:  5 times sit to stand: TBD Timed up and go (TUG):   11/11/22 12 seconds  GAIT: Distance walked: In clinic distances Assistive device utilized: Environmental consultant - 2 wheeled Level of assistance: Modified independence Comments: Step to gait pattern, slow, cautious, antalgic on L.   TODAY'S TREATMENT:                                                                                                                              DATE: 01/06/23 Nustep level 6 x 3 minutes, level 5 x 3 minutes Gait outside around the back building a good pace Passive stretch into knee extension On  airex ball toss On airex eyes closed, head turns, up and  down LEg curls 35# Leg extension 10# went over the set up and safe use, some eccentrics with extension Stairs working on step over step up and down Reviewed machines an how to set up nad do safely  12/30/22 Treadmill 0.9 mph x 4 min backwards walking, 1.0 mph x 2 min forwards walking Gastroc/soleus stretch with toes on bar with quad setting 2x10 SL heel/toe raise 2x10 Supine quad set 3x10 with PT overpressure PT manual stretch/overpressure for knee ext 2x30" Manual hamstring stretch with contract/relax 2x30" Runner's step up 6" 2x10 Eccentric step down 6" x10 with 1 hand hold, 4" x10 without UE support BOSU squats with UE support 2x10 BOSU static balance 2x30" Blue side of bosu ankle PF/DF with knees extended 2x10 Blue side of bosu step up and over 2x10   12/23/22 Treadmill 1.2 mph x 5 min with focus on even step length and extending L knee to keep R toe from dragging On decline, single leg mini squat /terminal extension 2x10 On decline, double leg terminal ext 2x10 Foot on incline, hip flexor/gastroc stretch x30" R&L Supine hip flexor stretch x30" Supine feet on pball, knees extended bridging 2x10, single leg x10 Supine quad set with PT overpressure for knee ext 2x10 Prone knee hang with PT overpressure and STM along posterior knee 2x10 Eccentric step down 6" 2x10 with intermittent UE support Ascend/descend steps x 5 reps without UE support  12/16/22 Bike L4 x 5 min Prone knee hang x2 min with overpressure Prone knee hang + quad set x10 Single leg press 20# 2x10 Squat with heels on bar x10 Single leg squat with heels on bar 2x10 Heel raise on bar 2x10  Runner's step up 6" 2x10 without UE support Eccentric step down 6" 2x10 without UE support  12/02/22 Bike L4.5 x 5 min Prone knee hang x2 min, with overpressure for increased extension Prone knee hang with quad set x10 + PT overpressure Manual therapy: Popliteus muscle STM and muscle release Supine quad set with  overpressure 2x10x5" Eccentric step down 4" step 2x10 (with and then without UE support) Eccentric side step down 4" step 2x10 (without UE support) Standing heel strike + quad set 2x10 (leaning against wall for balance) Standing stance phase with quad set 2x10  11/30/22 Nustep level 6 LE only x 3 minutes Bike level 5 x 3 minutes Gait outside around the back building no rest break TKE with ball behind knee  TKE with green tband behind knee Tmill push 20 seconds x4 Leg extension 10# eccentrics Prone leg hang SAQ Passive stretch into extension  11/25/22 Nustep level 5 x 5 minutes Bike level 4 x 5 mintues Tmill push fwd and back focus on extension Stairs 4" and 6" step over step Prone with leg hang for extension and use of the tgun on the HS Some prone QS and HS contract relax to gain extension Passive extension in supine Back to wall ball behind knee TKE Blue tband back to wall TKE  11/23/22 Bike x 5 min Forwards and backwards walking x 5 each at 5#, 10#, and 15# resistance around L knee Prone knee hang x2 min with massage gun to hamstring Contract/relax hamstring in prone 5x5 sec Prone quad set with prone knee hang 2x10 Supine quad set with over pressure 2x10 Supine SLR x10, with strap assist x10 Supine hip flexor stretch 2x30" Backwards walking 4x40' to work on terminal knee extension Forwards walking 4x40' working  on heel strike with knee extension  11/18/22 Bike L4 x 5 min Standing gastroc stretch on slant board x30" Standing soleus stretch on slant board x30" Seated hamstring stretch 2x30" Quad set with 3 sec overpressure x10, no overpressure x10 SLR using strap to keep extensor lag from occurring 2x10 Prone knee hang x2 min with overpressure Prone quad set x10 Manual therapy: IASTM hamstring with massage gun, PROM knee extension, grade II to III tibiofemoral mob for extension Backwards walking 4x50' to work on terminal knee extension  PATIENT EDUCATION:   Education details: POC, HEP Person educated: Patient Education method: Explanation Education comprehension: verbalized understanding  HOME EXERCISE PROGRAM: Access Code: [email or web address]   ASSESSMENT:  CLINICAL IMPRESSION:.  Overall patient is doing very well, we discussed the need to continue vs discharging at this time, we decided to d/c and have him try on his own, he does have some habits on stairs of not doing down step over step and turning hs body.  He has some decreased extension but really not an issue, he also has some swelling in the knee and some clicking here, he also has a "clunk " in what seems to be the left hip, asked him to ask Md about this  OBJECTIVE IMPAIRMENTS: Abnormal gait, decreased activity tolerance, decreased balance, decreased coordination, difficulty walking, decreased ROM, decreased strength, impaired flexibility, and pain.     GOALS: Goals reviewed with patient? Yes  SHORT TERM GOALS: Target date: 08/23/22 I with initial HEP Baseline: Goal status: 09/14/22 met  2.  Increase R knee ROM to 5-100 Baseline:  Goal status:  still struggling with full extension against gravity today was 8-117 degrees flexion  3.  Increase R knee strength to 3+/5 Baseline:  Goal status: met 10/05/22  LONG TERM GOALS: Target date: 01/13/2023  I with final HEP Baseline:  Goal status: progressing 11/11/22  2.  Increase L knee strength to 5/5 Baseline:  Goal status: MET 10/13/22  3.  Increase L knee ROM to 0-120 Baseline:  Goal status: 5-120 degrees 01/06/23  4.  Patient will walk x at least 500' with LRAD, MI Baseline:  Goal status: progressing 09/30/22  MET 10/21/22  5.  Complete 5 x STS in < 12 seconds with equal WB through BLE Baseline:  Goal status: 09/14/22 progressing, 11.67s MET 10/13/22  6.  Complete TUG in < 12 sec with LRAD, MI Baseline:  Goal status: 09/14/22 progressing, 9.25s MET 10/13/22   7.  Able to go up and down stairs without difficulty added  11/30/22 Baseline:  Goal status: met 01/06/23 PLAN:  PT FREQUENCY: 1x/week -- total of 4-5 more visits  PT DURATION: 6 weeks  PLANNED INTERVENTIONS: Therapeutic exercises, Therapeutic activity, Neuromuscular re-education, Balance training, Gait training, Patient/Family education, Self Care, Joint mobilization, Cryotherapy, Moist heat, scar mobilization, Taping, Vasopneumatic device, Ionotophoresis 4mg /ml Dexamethasone, and Manual therapy  PLAN FOR NEXT SESSION: cWill D/C with goals met Jearld Lesch, PT 01/06/2023, 1:57 PM

## 2023-02-14 ENCOUNTER — Other Ambulatory Visit: Payer: Self-pay | Admitting: Family Medicine

## 2023-02-14 ENCOUNTER — Other Ambulatory Visit: Payer: Self-pay | Admitting: Physician Assistant

## 2023-02-14 DIAGNOSIS — L309 Dermatitis, unspecified: Secondary | ICD-10-CM

## 2023-02-15 ENCOUNTER — Ambulatory Visit: Payer: Medicare PPO | Admitting: Physician Assistant

## 2023-02-17 ENCOUNTER — Other Ambulatory Visit (INDEPENDENT_AMBULATORY_CARE_PROVIDER_SITE_OTHER): Payer: Medicare PPO

## 2023-02-17 ENCOUNTER — Ambulatory Visit: Payer: Medicare PPO | Admitting: Physician Assistant

## 2023-02-17 ENCOUNTER — Encounter: Payer: Self-pay | Admitting: Physician Assistant

## 2023-02-17 DIAGNOSIS — Z96652 Presence of left artificial knee joint: Secondary | ICD-10-CM | POA: Diagnosis not present

## 2023-02-17 DIAGNOSIS — M79672 Pain in left foot: Secondary | ICD-10-CM

## 2023-02-17 NOTE — Progress Notes (Signed)
 Post-Op Visit Note   Patient: Joshua Ruiz           Date of Birth: 1940/06/13           MRN: [id number] Visit Date: 02/17/2023 PCP: Thedora Garnette HERO, MD   Assessment & Plan:  Chief Complaint:  Chief Complaint  Patient presents with   Left Knee - Follow-up    Left total knee arthroplasty 08/23/2022   Visit Diagnoses:  1. Status post total left knee replacement   2. Pain of left heel     Plan: Patient is a pleasant 83 year old gentleman who comes in today 6 months status post left total knee replacement 08/23/2022.  He has been doing well.  No complaints.  Other issue he brings up today is left heel pain for the past 2 to 3 days.  No injury.  He does note he did a lot of walking on 01/30/2023.  The pain is to the bottom of the heel it is worse after he has been sleeping he goes to stand in the morning.  He has been using Salonpas without relief.  Examination of the left knee reveals range of motion from 0 to 120 degrees.  Stable to valgus varus stress.  Neurovascularly intact distally.  Left heel exam: Moderate tenderness of the heel over the plantar fascia insertion.  Dorsiflexion past neutral.  He is neurovascularly intact distally.  Plan: In regards to the left knee, he is doing well.  Continue with range of motion exercises.  Follow-up in 6 months for repeat evaluation and 2 view x-rays.  Dental prophylaxis reinforced.  Call with concerns or questions.  In regards to the left heel, he seems to have classic plantar fasciitis.  We have discussed full foot orthotics, appropriate shoewear and stretches for which we have provided today.  He will follow-up with us  as needed.  Call with concerns or questions.  Follow-Up Instructions: Return in about 6 months (around 08/17/2023).   Orders:  Orders Placed This Encounter  Procedures   XR Knee 1-2 Views Left   XR Os Calcis Left   No orders of the defined types were placed in this encounter.   Imaging: XR Os Calcis Left Result Date:  02/17/2023 Retrocalcaneal osteophyte formation  XR Knee 1-2 Views Left Result Date: 02/17/2023 Well-seated prosthesis without complication   PMFS History: Patient Active Problem List   Diagnosis Date Noted   Numbness and tingling of fingertips 09/29/2022   Urinary retention 08/31/2022   Status post total left knee replacement 08/23/2022   Chronic rhinitis 02/15/2022   Eczema 02/15/2022   Spondylolisthesis of lumbar region 11/20/2021   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/2021   Lumbar spondylosis 10/20/2021   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-L5 level 10/20/2021   Primary osteoarthritis of left knee 08/14/2021   Chronic epididymitis 08/13/2020   Hives 07/11/2020   Rheumatoid arthritis (HCC) 07/11/2020   Cervical arthritis 07/11/2020   Gastroesophageal reflux disease 07/11/2020   Presbyopia of both eyes 04/20/2015   Pseudophakia of both eyes 04/20/2015   Past Medical History:  Diagnosis Date   Cancer (HCC)    Skin cancer left ear and nose   Complication of anesthesia    Sinus Infection after back surgery 11/2021   GERD (gastroesophageal reflux disease)    Rheumatoid arthritis (HCC)     Family History  Problem Relation Age of Onset   Rheum arthritis Mother    Diabetes Mother    Cancer Sister        Lung  Cancer Brother        Lung    Past Surgical History:  Procedure Laterality Date   COLONOSCOPY  2021   EYE SURGERY Bilateral    cataract extraction   HEMORRHOID SURGERY     LAMINECTOMY WITH POSTERIOR LATERAL ARTHRODESIS LEVEL 3 N/A 11/20/2021   Procedure: LUMBAR TWO THROUGH LUMBAR THREE, LUMBAR THREE THROUGH LUMBAR FOUR, LUMBAR FOUR THROUGH LUMBAR FIVE LAMINECTOMY WITH FACETECTOMY, POSTERIOR SEGMENTAL INSTRUMENTATION, POSTEROLATERAL ARTHRODESIS;  Surgeon: Lanis Pupa, MD;  Location: [facility];  Service: Neurosurgery;  Laterality: N/A;  3C   LYMPH NODE BIOPSY     Neck   MOHS SURGERY Left 2016   Ear. [facility] in [place]   TOTAL KNEE ARTHROPLASTY  Left 08/23/2022   Procedure: LEFT TOTAL KNEE REPLACEMENT;  Surgeon: Jerri Kay HERO, MD;  Location: [facility] OR;  Service: Orthopedics;  Laterality: Left;   TRIGGER FINGER RELEASE Right    3rd   Social History   Occupational History   Not on file  Tobacco Use   Smoking status: Never    Passive exposure: Never   Smokeless tobacco: Never  Vaping Use   Vaping status: Never Used  Substance and Sexual Activity   Alcohol use: Not Currently    Comment: none   Drug use: Never   Sexual activity: Yes

## 2023-02-24 DIAGNOSIS — Z79899 Other long term (current) drug therapy: Secondary | ICD-10-CM | POA: Diagnosis not present

## 2023-03-29 ENCOUNTER — Ambulatory Visit: Payer: Medicare PPO | Admitting: Family Medicine

## 2023-03-29 ENCOUNTER — Encounter: Payer: Self-pay | Admitting: Family Medicine

## 2023-03-29 VITALS — BP 130/70 | HR 67 | Temp 98.2°F | Ht 68.0 in | Wt 197.8 lb

## 2023-03-29 DIAGNOSIS — Z96652 Presence of left artificial knee joint: Secondary | ICD-10-CM | POA: Diagnosis not present

## 2023-03-29 DIAGNOSIS — M069 Rheumatoid arthritis, unspecified: Secondary | ICD-10-CM | POA: Diagnosis not present

## 2023-03-29 DIAGNOSIS — R339 Retention of urine, unspecified: Secondary | ICD-10-CM

## 2023-03-29 NOTE — Progress Notes (Signed)
 [facility] [address] [place] [address] Dept: [phone] Dept Fax: [phone]  Chronic Care Office Visit  Subjective:    Patient ID: Joshua Ruiz, male    DOB: December 04, 1940, 83 y.o..   MRN: [id number]  Chief Complaint  Patient presents with   Follow-up    6 month f/u.      History of Present Illness:  Patient is in today for reassessment of chronic medical issues.  Mr. Tavano underwent left total knee joint replacement in Aug. 2024. He has completed all of his physical therapy. He feels he has made great improvement in his ambulation. He notes mild residual increased warmth, but improved stability compared to his right knee.  His surgery was complicated with the development of urinary retention afterwards. This has since resolved. Had does remain on finasteride 5 mg daily . He had been on tamsulosin, but had issues with orthostasis, so this was stopped.  Mr. Lorey has a history of RA. He is managed by Dr. Baldwin Jamaica (rheumatology) on methotrexate and hydroxychloroquine. He feels his arthritis is in fairly good control at present.  Past Medical History: Patient Active Problem List   Diagnosis Date Noted   Numbness and tingling of fingertips 09/29/2022   Posterior vitreous detachment of both eyes 09/29/2022   High risk medication use 09/29/2022   Early dry stage nonexudative age-related macular degeneration of both eyes 09/29/2022   Urinary retention 08/31/2022   Status post total left knee replacement 08/23/2022   Chronic rhinitis 02/15/2022   Eczema 02/15/2022   Spondylolisthesis of lumbar region 11/20/2021   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/2021   Lumbar spondylosis 10/20/2021   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-L5 level 10/20/2021   Chronic epididymitis 08/13/2020   Hives 07/11/2020   Rheumatoid arthritis (HCC) 07/11/2020   Cervical arthritis 07/11/2020   Gastroesophageal reflux disease  07/11/2020   Presbyopia of both eyes 04/20/2015   Pseudophakia of both eyes 04/20/2015   Past Surgical History:  Procedure Laterality Date   COLONOSCOPY  2021   EYE SURGERY Bilateral    cataract extraction   HEMORRHOID SURGERY     LAMINECTOMY WITH POSTERIOR LATERAL ARTHRODESIS LEVEL 3 N/A 11/20/2021   Procedure: LUMBAR TWO THROUGH LUMBAR THREE, LUMBAR THREE THROUGH LUMBAR FOUR, LUMBAR FOUR THROUGH LUMBAR FIVE LAMINECTOMY WITH FACETECTOMY, POSTERIOR SEGMENTAL INSTRUMENTATION, POSTEROLATERAL ARTHRODESIS;  Surgeon: Lisbeth Renshaw, MD;  Location: [facility];  Service: Neurosurgery;  Laterality: N/A;  3C   LYMPH NODE BIOPSY     Neck   MOHS SURGERY Left 2016   Ear. [facility] in [place]   TOTAL KNEE ARTHROPLASTY Left 08/23/2022   Procedure: LEFT TOTAL KNEE REPLACEMENT;  Surgeon: Tarry Kos, MD;  Location: [facility];  Service: Orthopedics;  Laterality: Left;   TRIGGER FINGER RELEASE Right    3rd   Family History  Problem Relation Age of Onset   Rheum arthritis Mother    Diabetes Mother    Cancer Sister        Lung   Cancer Brother        Lung   Outpatient Medications Prior to Visit  Medication Sig Dispense Refill   amoxicillin (AMOXIL) 500 MG tablet Take 4 tablets by mouth 1 hour prior to dental procedure. 4 tablet 2   cetirizine (ZYRTEC) 10 MG tablet Take 10 mg by mouth at bedtime.     docusate sodium (COLACE) 100 MG capsule Take 1 capsule (100 mg total) by mouth daily as needed. 30 capsule 2  finasteride (PROSCAR) 5 MG tablet Take 5 mg by mouth daily.     fluticasone (FLONASE) 50 MCG/ACT nasal spray Place into both nostrils.     folic acid (FOLVITE) 1 MG tablet Take 1 mg by mouth daily.     hydroxychloroquine (PLAQUENIL) 200 MG tablet Take 200 mg by mouth 2 (two) times daily.     loratadine (CLARITIN) 10 MG tablet Take 10 mg by mouth in the morning.     methotrexate (RHEUMATREX) 2.5 MG tablet Take 15 mg by mouth every Saturday. Caution:Chemotherapy. Protect from light.      mometasone (ELOCON) 0.1 % cream APPLY TO AFFECTED AREA TOPICALLY EVERY DAY 15 g 1   Multiple Vitamin (MULTIVITAMIN WITH MINERALS) TABS tablet Take 1 tablet by mouth daily.     Multiple Vitamins-Minerals (PRESERVISION AREDS 2) CAPS Take 1 capsule by mouth in the morning and at bedtime.     omeprazole (PRILOSEC) 20 MG capsule Take 20 mg by mouth in the morning and at bedtime.     ondansetron (ZOFRAN) 4 MG tablet Take 1 tablet (4 mg total) by mouth every 8 (eight) hours as needed for nausea or vomiting. 40 tablet 0   sildenafil (VIAGRA) 100 MG tablet Take 100 mg by mouth daily as needed.     tadalafil (CIALIS) 5 MG tablet Take 5 mg by mouth daily.     azelastine (ASTELIN) 0.1 % nasal spray Place 1 spray into both nostrils 2 (two) times daily. 30 mL 12   methocarbamol (ROBAXIN) 500 MG tablet Take 1 tablet (500 mg total) by mouth every 6 (six) hours as needed for muscle spasms. 90 tablet 0   aspirin EC 81 MG tablet Take 1 tablet (81 mg total) by mouth 2 (two) times daily. To be taken after surgery to prevent blood clots (Patient not taking: Reported on 03/29/2023) 84 tablet 0   tamsulosin (FLOMAX) 0.4 MG CAPS capsule Take 0.4 mg by mouth at bedtime. (Patient not taking: Reported on 03/29/2023)     ciprofloxacin (CIPRO) 500 MG tablet Take 500 mg by mouth 2 (two) times daily.     ipratropium (ATROVENT) 0.06 % nasal spray Place 1 spray into both nostrils 2 (two) times daily.     oxyCODONE-acetaminophen (PERCOCET) 5-325 MG tablet Take 1-2 tablets by mouth every 6 (six) hours as needed. To be taken after surgery 40 tablet 0   No facility-administered medications prior to visit.   No Known Allergies Objective:   Today's Vitals   03/29/23 1249  BP: 130/70  Pulse: 67  Temp: 98.2 F (36.8 C)  TempSrc: Temporal  SpO2: 97%  Weight: 197 lb 12.8 oz (89.7 kg)  Height: 5\' 8"  (1.727 m)   Body mass index is 30.08 kg/m.   General: Well developed, well nourished. No acute distress. Extremities: Full ROM. No  joint swelling or tenderness. No edema noted. Psych: Alert and oriented. Normal mood and affect.  Health Maintenance Due  Topic Date Due   Zoster Vaccines- Shingrix (1 of 2) Never done     Assessment & Plan:   Problem List Items Addressed This Visit       Musculoskeletal and Integument   Rheumatoid arthritis (HCC) - Primary   Stable. Continue methotrexate 15 mg every Saturday and hydroxychloroquine (Plaquenil) 200 mg bid. He will continue to follow for monitoring of this with Dr. Baldwin Jamaica.        Genitourinary   Urinary retention   Resolved. He will continue finasteride 5 mg daily.  Other   Status post total left knee replacement   Improved. Continue home PT program.       Return in about 6 months (around 09/29/2023) for Annual preventative care.   Loyola Mast, MD

## 2023-03-29 NOTE — Assessment & Plan Note (Signed)
 Improved. Continue home PT program.

## 2023-03-29 NOTE — Assessment & Plan Note (Signed)
 Stable. Continue methotrexate 15 mg every Saturday and hydroxychloroquine (Plaquenil) 200 mg bid. He will continue to follow for monitoring of this with Dr. Baldwin Jamaica.

## 2023-03-29 NOTE — Assessment & Plan Note (Signed)
 Resolved. He will continue finasteride 5 mg daily.

## 2023-04-25 DIAGNOSIS — Z79899 Other long term (current) drug therapy: Secondary | ICD-10-CM | POA: Diagnosis not present

## 2023-05-11 ENCOUNTER — Other Ambulatory Visit: Payer: Self-pay | Admitting: Physician Assistant

## 2023-06-22 DIAGNOSIS — M8949 Other hypertrophic osteoarthropathy, multiple sites: Secondary | ICD-10-CM | POA: Diagnosis not present

## 2023-06-22 DIAGNOSIS — Z79899 Other long term (current) drug therapy: Secondary | ICD-10-CM | POA: Diagnosis not present

## 2023-06-22 DIAGNOSIS — M069 Rheumatoid arthritis, unspecified: Secondary | ICD-10-CM | POA: Diagnosis not present

## 2023-06-22 DIAGNOSIS — M47816 Spondylosis without myelopathy or radiculopathy, lumbar region: Secondary | ICD-10-CM | POA: Diagnosis not present

## 2023-08-01 DIAGNOSIS — R3911 Hesitancy of micturition: Secondary | ICD-10-CM | POA: Diagnosis not present

## 2023-08-01 DIAGNOSIS — Z1389 Encounter for screening for other disorder: Secondary | ICD-10-CM | POA: Diagnosis not present

## 2023-08-01 DIAGNOSIS — D539 Nutritional anemia, unspecified: Secondary | ICD-10-CM | POA: Diagnosis not present

## 2023-08-01 DIAGNOSIS — N401 Enlarged prostate with lower urinary tract symptoms: Secondary | ICD-10-CM | POA: Diagnosis not present

## 2023-08-01 DIAGNOSIS — Z0000 Encounter for general adult medical examination without abnormal findings: Secondary | ICD-10-CM | POA: Diagnosis not present

## 2023-08-01 DIAGNOSIS — M069 Rheumatoid arthritis, unspecified: Secondary | ICD-10-CM | POA: Diagnosis not present

## 2023-08-01 DIAGNOSIS — E785 Hyperlipidemia, unspecified: Secondary | ICD-10-CM | POA: Diagnosis not present

## 2023-08-01 DIAGNOSIS — R0602 Shortness of breath: Secondary | ICD-10-CM | POA: Diagnosis not present

## 2023-08-01 DIAGNOSIS — Z6830 Body mass index (BMI) 30.0-30.9, adult: Secondary | ICD-10-CM | POA: Diagnosis not present

## 2023-08-09 DIAGNOSIS — D2371 Other benign neoplasm of skin of right lower limb, including hip: Secondary | ICD-10-CM | POA: Diagnosis not present

## 2023-08-09 DIAGNOSIS — L821 Other seborrheic keratosis: Secondary | ICD-10-CM | POA: Diagnosis not present

## 2023-08-09 DIAGNOSIS — D234 Other benign neoplasm of skin of scalp and neck: Secondary | ICD-10-CM | POA: Diagnosis not present

## 2023-08-09 DIAGNOSIS — D2372 Other benign neoplasm of skin of left lower limb, including hip: Secondary | ICD-10-CM | POA: Diagnosis not present

## 2023-08-09 DIAGNOSIS — D235 Other benign neoplasm of skin of trunk: Secondary | ICD-10-CM | POA: Diagnosis not present

## 2023-08-09 DIAGNOSIS — D2361 Other benign neoplasm of skin of right upper limb, including shoulder: Secondary | ICD-10-CM | POA: Diagnosis not present

## 2023-08-09 DIAGNOSIS — Z08 Encounter for follow-up examination after completed treatment for malignant neoplasm: Secondary | ICD-10-CM | POA: Diagnosis not present

## 2023-08-09 DIAGNOSIS — D2362 Other benign neoplasm of skin of left upper limb, including shoulder: Secondary | ICD-10-CM | POA: Diagnosis not present

## 2023-08-09 DIAGNOSIS — D2339 Other benign neoplasm of skin of other parts of face: Secondary | ICD-10-CM | POA: Diagnosis not present

## 2023-08-17 ENCOUNTER — Ambulatory Visit: Payer: Medicare PPO | Admitting: Orthopaedic Surgery

## 2023-08-17 ENCOUNTER — Other Ambulatory Visit (INDEPENDENT_AMBULATORY_CARE_PROVIDER_SITE_OTHER): Payer: Self-pay

## 2023-08-17 DIAGNOSIS — M25511 Pain in right shoulder: Secondary | ICD-10-CM

## 2023-08-17 DIAGNOSIS — G8929 Other chronic pain: Secondary | ICD-10-CM

## 2023-08-17 DIAGNOSIS — Z96652 Presence of left artificial knee joint: Secondary | ICD-10-CM

## 2023-08-17 DIAGNOSIS — Z79899 Other long term (current) drug therapy: Secondary | ICD-10-CM | POA: Diagnosis not present

## 2023-08-17 MED ORDER — METHYLPREDNISOLONE 4 MG PO TBPK: ORAL_TABLET | ORAL | 0 refills | Status: DC

## 2023-08-17 NOTE — Progress Notes (Signed)
 Office Visit Note   Patient: Joshua Ruiz           Date of Birth: 1940-03-06           MRN: [id number] Visit Date: 08/17/2023              Requested by: Thedora Garnette HERO, MD [address] [place],  [place] [address] PCP: Thedora Garnette HERO, MD   Assessment & Plan: Visit Diagnoses:  1. Status post total left knee replacement   2. Chronic right shoulder pain     Plan: History of Present Illness [name] is an 83 year old male with rheumatoid arthritis who presents for follow-up and evaluation of right shoulder stiffness and weakness.  He experiences stiffness and weakness in the right shoulder and arm, with a full range of motion but requiring slow movements. There is no recent injury to the shoulder. Occasionally, there is shaking in the shoulder and a sensation of pulling back and down when the shoulder is elevated.  He is on methotrexate, folic acid , and hydroxychloroquine  for rheumatoid arthritis and is concerned about potential interactions with new medications.  He is here for a follow-up one year after knee replacement, which he is satisfied with, noting improved stability and no issues since the surgery.  Physical Exam MUSCULOSKELETAL: Right rotator cuff strength is normal to manual muscle testing.  Range of motion is normal.  No impingement signs.  Results RADIOLOGY Knee X-ray: Implant appears intact and well-positioned.  Assessment and Plan Right shoulder osteoarthritis Right shoulder stiffness and decreased strength due to age-related osteoarthritis. Rotator cuff intact. - Prescribe NSAIDs for two weeks. - Consider cortisone injection if no improvement. - Send prescription to [facility] pharmacy.  Status post left total knee arthroplasty 1 year postop - Doing well from that standpoint. - Dental prophylaxis reinforced.  Recheck in another year. - Repeat radiographs.  Follow-Up Instructions: No follow-ups on file.   Orders:  Orders Placed  This Encounter  Procedures   XR Knee 1-2 Views Left   XR Shoulder Right   Meds ordered this encounter  Medications   methylPREDNISolone  (MEDROL  DOSEPAK) 4 MG TBPK tablet    Sig: Take as directed    Dispense:  21 tablet    Refill:  0      Procedures: No procedures performed   Clinical Data: No additional findings.   Subjective: Chief Complaint  Patient presents with   Left Knee - Follow-up    Left total knee arthroplasty- 08/23/2022   Right Shoulder - Pain    HPI  Review of Systems  Constitutional: Negative.   HENT: Negative.    Eyes: Negative.   Respiratory: Negative.    Cardiovascular: Negative.   Gastrointestinal: Negative.   Endocrine: Negative.   Genitourinary: Negative.   Skin: Negative.   Allergic/Immunologic: Negative.   Neurological: Negative.   Hematological: Negative.   Psychiatric/Behavioral: Negative.    All other systems reviewed and are negative.    Objective: Vital Signs: There were no vitals taken for this visit.  Physical Exam Vitals and nursing note reviewed.  Constitutional:      Appearance: He is well-developed.  HENT:     Head: Normocephalic and atraumatic.  Eyes:     Pupils: Pupils are equal, round, and reactive to light.  Pulmonary:     Effort: Pulmonary effort is normal.  Abdominal:     Palpations: Abdomen is soft.  Musculoskeletal:        General: Normal range of motion.  Cervical back: Neck supple.  Skin:    General: Skin is warm.  Neurological:     Mental Status: He is alert and oriented to person, place, and time.  Psychiatric:        Behavior: Behavior normal.        Thought Content: Thought content normal.        Judgment: Judgment normal.     Ortho Exam  Specialty Comments:  No specialty comments available.  Imaging: XR Shoulder Right Result Date: 08/17/2023 X-rays of the right shoulder show age-appropriate degenerative spurring of the [facility] and glenohumeral joints.  XR Knee 1-2 Views Left Result  Date: 08/17/2023 X-rays of the left knee show a stable left total knee replacement in good alignment.     PMFS History: Patient Active Problem List   Diagnosis Date Noted   Numbness and tingling of fingertips 09/29/2022   Posterior vitreous detachment of both eyes 09/29/2022   High risk medication use 09/29/2022   Early dry stage nonexudative age-related macular degeneration of both eyes 09/29/2022   Urinary retention 08/31/2022   Status post total left knee replacement 08/23/2022   Chronic rhinitis 02/15/2022   Eczema 02/15/2022   Spondylolisthesis of lumbar region 11/20/2021   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/2021   Lumbar spondylosis 10/20/2021   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-L5 level 10/20/2021   Chronic epididymitis 08/13/2020   Hives 07/11/2020   Rheumatoid arthritis (HCC) 07/11/2020   Cervical arthritis 07/11/2020   Gastroesophageal reflux disease 07/11/2020   Presbyopia of both eyes 04/20/2015   Pseudophakia of both eyes 04/20/2015   Past Medical History:  Diagnosis Date   Cancer (HCC)    Skin cancer left ear and nose   Complication of anesthesia    Sinus Infection after back surgery 11/2021   GERD (gastroesophageal reflux disease)    Rheumatoid arthritis (HCC)     Family History  Problem Relation Age of Onset   Rheum arthritis Mother    Diabetes Mother    Cancer Sister        Lung   Cancer Brother        Lung    Past Surgical History:  Procedure Laterality Date   COLONOSCOPY  2021   EYE SURGERY Bilateral    cataract extraction   HEMORRHOID SURGERY     LAMINECTOMY WITH POSTERIOR LATERAL ARTHRODESIS LEVEL 3 N/A 11/20/2021   Procedure: LUMBAR TWO THROUGH LUMBAR THREE, LUMBAR THREE THROUGH LUMBAR FOUR, LUMBAR FOUR THROUGH LUMBAR FIVE LAMINECTOMY WITH FACETECTOMY, POSTERIOR SEGMENTAL INSTRUMENTATION, POSTEROLATERAL ARTHRODESIS;  Surgeon: Lanis Pupa, MD;  Location: [facility] OR;  Service: Neurosurgery;  Laterality: N/A;  3C   LYMPH NODE BIOPSY      Neck   MOHS SURGERY Left 2016   Ear. [facility] in [place]   TOTAL KNEE ARTHROPLASTY Left 08/23/2022   Procedure: LEFT TOTAL KNEE REPLACEMENT;  Surgeon: Jerri Kay HERO, MD;  Location: [facility] OR;  Service: Orthopedics;  Laterality: Left;   TRIGGER FINGER RELEASE Right    3rd   Social History   Occupational History   Not on file  Tobacco Use   Smoking status: Never    Passive exposure: Never   Smokeless tobacco: Never  Vaping Use   Vaping status: Never Used  Substance and Sexual Activity   Alcohol use: Not Currently    Comment: none   Drug use: Never   Sexual activity: Yes

## 2023-08-19 ENCOUNTER — Telehealth: Payer: Self-pay | Admitting: *Deleted

## 2023-08-19 NOTE — Telephone Encounter (Signed)
 1 year follow up for Left total knee arthroplasty. Met patient in office and doing well.

## 2023-09-13 DIAGNOSIS — H11123 Conjunctival concretions, bilateral: Secondary | ICD-10-CM | POA: Diagnosis not present

## 2023-09-13 DIAGNOSIS — H04123 Dry eye syndrome of bilateral lacrimal glands: Secondary | ICD-10-CM | POA: Diagnosis not present

## 2023-09-13 DIAGNOSIS — M069 Rheumatoid arthritis, unspecified: Secondary | ICD-10-CM | POA: Diagnosis not present

## 2023-09-28 ENCOUNTER — Ambulatory Visit: Payer: Self-pay

## 2023-09-28 ENCOUNTER — Telehealth: Payer: Self-pay | Admitting: Family Medicine

## 2023-09-28 NOTE — Telephone Encounter (Signed)
 See nurse triage encounter.

## 2023-09-28 NOTE — Telephone Encounter (Signed)
 Copied from CRM #[id number]. Topic: Clinical - Pink Word Triage >> Sep 28, 2023  2:45 PM Frederich PARAS wrote: Reason for Triage: BOTTLEOF WATER, A MONTH, 3 OR 4 SWALLOWS, SYMPTOMS STOMACH ROLLINGA ROUND GAS,HURT, STOMACH PAINS ARE MILD. STOOLS ARE LOOSE, ABDOMINAL PAIN >> Sep 28, 2023  2:46 PM Frederich PARAS wrote: DRUNK A BOTTLE OF WATER, A MONTH OLD , HE TOOK 3 OR 4 SWALLOWS, SYMPTOMS STOMACH ROLLING AROUND GAS, STOMACH PAINS ARE MILD. STOOLS ARE LOOSE, ABDOMINAL PAIN

## 2023-09-28 NOTE — Telephone Encounter (Signed)
 FYI Only or Action Required?: FYI only for provider.  Patient was last seen in primary care on 03/29/2023 by Thedora Garnette HERO, MD.  Called Nurse Triage reporting Abdominal Pain.  Symptoms began several weeks ago.  Interventions attempted: OTC medications: pepto bismol.  Symptoms are: unchanged.  Triage Disposition: See Physician Within 24 Hours  Patient/caregiver understands and will follow disposition?: Yes    Message from Hollis J sent at 09/28/2023  2:46 PM EDT  DRUNK A BOTTLE OF WATER, A MONTH OLD , HE TOOK 3 OR 4 SWALLOWS, SYMPTOMS STOMACH ROLLING AROUND GAS, STOMACH PAINS ARE MILD. STOOLS ARE LOOSE, ABDOMINAL PAIN     Reason for Disposition  [1] MILD pain (e.g., does not interfere with normal activities) AND [2] pain comes and goes (cramps) [3] present > 48 hours  (Exception: This same abdominal pain is a chronic symptom recurrent or ongoing AND present > 4 weeks.)  Answer Assessment - Initial Assessment Questions Patient says 3 weeks ago he took a few sips out of a bottle water that had been in the car for over a month, then about 8 hours later he started having abdominal pain, gas feeling, mild cramping at times. He says he notices the gas feeling after he eats and says the pain is not constant, but present now. Advised OV and first available with Dr. Thedora is next Tuesday, but I can schedule with another provider sooner. He says he only wants to see Dr. Thedora. Scheduled first available.   1. LOCATION: Where does it hurt?      Belt line and sometimes below  2. RADIATION: Does the pain shoot anywhere else? (e.g., chest, back)     No  3. ONSET: When did the pain begin? (Minutes, hours or days ago)      3 weeks ago  4. SUDDEN: Gradual or sudden onset?     Sudden onset 8 hours after taking 3-4 swallows  5. PATTERN Does the pain come and go, or is it constant?     Comes and goes  6. SEVERITY: How bad is the pain?  (e.g., Scale 1-10; mild, moderate, or severe)      3-4, cramp once in a while, more a dull ache  7. CAUSE: What do you think is causing the stomach pain? (e.g., gallstones, recent abdominal surgery)     Drinking old water  8. RELIEVING/AGGRAVATING FACTORS: What makes it better or worse? (e.g., antacids, bending or twisting motion, bowel movement)     Took pepto-bismol  9. OTHER SYMPTOMS: Do you have any other symptoms? (e.g., back pain, diarrhea, fever, urination pain, vomiting)      Loose stools past 3 weeks  Protocols used: Abdominal Pain - Male-A-AH

## 2023-10-04 ENCOUNTER — Ambulatory Visit: Admitting: Family Medicine

## 2023-10-04 ENCOUNTER — Ambulatory Visit: Payer: Self-pay | Admitting: Family Medicine

## 2023-10-04 ENCOUNTER — Encounter: Payer: Self-pay | Admitting: Family Medicine

## 2023-10-04 VITALS — BP 140/78 | HR 56 | Temp 97.3°F | Ht 68.0 in | Wt 192.6 lb

## 2023-10-04 DIAGNOSIS — R197 Diarrhea, unspecified: Secondary | ICD-10-CM

## 2023-10-04 DIAGNOSIS — R1030 Lower abdominal pain, unspecified: Secondary | ICD-10-CM | POA: Diagnosis not present

## 2023-10-04 DIAGNOSIS — R195 Other fecal abnormalities: Secondary | ICD-10-CM

## 2023-10-04 LAB — CBC WITH DIFFERENTIAL/PLATELET
Basophils Absolute: 0 K/uL (ref 0.0–0.1)
Basophils Relative: 0.5 % (ref 0.0–3.0)
Eosinophils Absolute: 0.2 K/uL (ref 0.0–0.7)
Eosinophils Relative: 3.6 % (ref 0.0–5.0)
HCT: 35.7 % — ABNORMAL LOW (ref 39.0–52.0)
Hemoglobin: 12.2 g/dL — ABNORMAL LOW (ref 13.0–17.0)
Lymphocytes Relative: 34.8 % (ref 12.0–46.0)
Lymphs Abs: 1.6 K/uL (ref 0.7–4.0)
MCHC: 34.3 g/dL (ref 30.0–36.0)
MCV: 93.5 fl (ref 78.0–100.0)
Monocytes Absolute: 0.5 K/uL (ref 0.1–1.0)
Monocytes Relative: 11.5 % (ref 3.0–12.0)
Neutro Abs: 2.3 K/uL (ref 1.4–7.7)
Neutrophils Relative %: 49.6 % (ref 43.0–77.0)
Platelets: 169 K/uL (ref 150.0–400.0)
RBC: 3.82 Mil/uL — ABNORMAL LOW (ref 4.22–5.81)
RDW: 14.1 % (ref 11.5–15.5)
WBC: 4.7 K/uL (ref 4.0–10.5)

## 2023-10-04 LAB — BASIC METABOLIC PANEL WITH GFR
BUN: 14 mg/dL (ref 6–23)
CO2: 27 meq/L (ref 19–32)
Calcium: 8.5 mg/dL (ref 8.4–10.5)
Chloride: 107 meq/L (ref 96–112)
Creatinine, Ser: 0.87 mg/dL (ref 0.40–1.50)
GFR: 79.68 mL/min (ref >60.00)
Glucose, Bld: 81 mg/dL (ref 70–99)
Potassium: 3.8 meq/L (ref 3.5–5.1)
Sodium: 142 meq/L (ref 135–145)

## 2023-10-04 NOTE — Patient Instructions (Signed)
 Clear Liquid Diet, Adult A clear liquid diet is a diet that includes only liquids and semi-liquids that you can see through when you hold them up to a light. An example of a semi-liquid is gelatin. No solid food is eaten on this diet. You may need to follow a clear liquid diet if: You have a problem right before or after you have surgery. You did not eat food for a long time. You had: Vomiting, feeling like you may vomit, or nausea. Diarrhea, which is watery poop. You are going to have a test to look at your digestive system. You are going to have bowel surgery. What are the goals of this diet? To help your stomach and digestive system rest. To help clear your digestive system before a test. To make sure that you get enough fluid. To make sure you get some calories for energy. To help you get back to eating like you used to. What are tips for following this plan? This diet does not give you all the nutrients that you need. Choose a variety of the liquids that your health care provider says you can have on this diet. That way, you will get as many nutrients as possible. If you are not sure whether you can have certain things, ask your provider. If you cannot swallow a thin liquid, you will need to thicken it before drinking it. This will stop you from breathing it in. What foods should I eat?  Water and flavored water. Fruit juices that do not have pulp in them. This includes cranberry juice, apple juice, and grape juice. Tea and coffee without milk or cream. Clear broth. Broth-based soups that have been strained to get all the food pieces out. Flavored gelatin. Honey. Sugar water. Ice or frozen ice pops that do not have any milk, yogurt, fruit pieces, or fruit pulp in them. Clear sodas. Clear sports drinks. The items listed above may not be all the foods and drinks you can have. Talk with an expert in healthy eating called a dietitian to learn more. What food should I avoid? Juices  that have pulp. Milk. Cream or cream-based soups. Yogurt. Solid foods that are not clear liquids or semi-liquids. The items listed above may not be all the foods and drinks you should avoid. Talk with a dietitian to learn more. Questions to ask your health care provider: How long do I need to follow this diet? Are there any medicines that I should change while on this diet? This information is not intended to replace advice given to you by your health care provider. Make sure you discuss any questions you have with your health care provider. Document Revised: 09/30/2022 Document Reviewed: 02/16/2022 Elsevier Patient Education  2024 [employer].

## 2023-10-04 NOTE — Progress Notes (Addendum)
 [facility] [address] [place] [address] Dept: [phone] Dept Fax: [phone]  Office Visit  Subjective:    Patient ID: Donna Mars, male    DOB: 30-May-1940, 83 y.o..   MRN: [id number]  Chief Complaint  Patient presents with   Abdominal Pain    C/o having abdominal pain, diarrhea off/on x 1 month.  Has taken pepto.      History of Present Illness:  Patient is in today complaining of a 3-4 week history of abdominal pain associated with intermittent diarrhea. He has not run fever. He notes his stools have looked dark at times. He has been takign some Pepto-Bismol. He was concerned about a possible parasite, as he had consumed some water form a bottle that had been in his truck for some weeks. He notes he has lost weight, but admits this is in part due to not eating as much as he did in the past.  Past Medical History: Patient Active Problem List   Diagnosis Date Noted   Numbness and tingling of fingertips 09/29/2022   Posterior vitreous detachment of both eyes 09/29/2022   High risk medication use 09/29/2022   Early dry stage nonexudative age-related macular degeneration of both eyes 09/29/2022   Urinary retention 08/31/2022   Status post total left knee replacement 08/23/2022   Chronic rhinitis 02/15/2022   Eczema 02/15/2022   Spondylolisthesis of lumbar region 11/20/2021   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/2021   Lumbar spondylosis 10/20/2021   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-L5 level 10/20/2021   Chronic epididymitis 08/13/2020   Hives 07/11/2020   Rheumatoid arthritis (HCC) 07/11/2020   Cervical arthritis 07/11/2020   Gastroesophageal reflux disease 07/11/2020   Presbyopia of both eyes 04/20/2015   Pseudophakia of both eyes 04/20/2015   Past Surgical History:  Procedure Laterality Date   COLONOSCOPY  2021   EYE SURGERY Bilateral    cataract extraction   HEMORRHOID SURGERY     LAMINECTOMY  WITH POSTERIOR LATERAL ARTHRODESIS LEVEL 3 N/A 11/20/2021   Procedure: LUMBAR TWO THROUGH LUMBAR THREE, LUMBAR THREE THROUGH LUMBAR FOUR, LUMBAR FOUR THROUGH LUMBAR FIVE LAMINECTOMY WITH FACETECTOMY, POSTERIOR SEGMENTAL INSTRUMENTATION, POSTEROLATERAL ARTHRODESIS;  Surgeon: Lanis Pupa, MD;  Location: [facility] OR;  Service: Neurosurgery;  Laterality: N/A;  3C   LYMPH NODE BIOPSY     Neck   MOHS SURGERY Left 2016   Ear. [facility] in [place]   TOTAL KNEE ARTHROPLASTY Left 08/23/2022   Procedure: LEFT TOTAL KNEE REPLACEMENT;  Surgeon: Jerri Kay HERO, MD;  Location: [facility] OR;  Service: Orthopedics;  Laterality: Left;   TRIGGER FINGER RELEASE Right    3rd   Family History  Problem Relation Age of Onset   Rheum arthritis Mother    Diabetes Mother    Cancer Sister        Lung   Cancer Brother        Lung   Outpatient Medications Prior to Visit  Medication Sig Dispense Refill   Azelastine  HCl 137 MCG/SPRAY SOLN Place 1 spray into both nostrils 2 (two) times daily.     cetirizine (ZYRTEC) 10 MG tablet Take 10 mg by mouth at bedtime.     finasteride  (PROSCAR ) 5 MG tablet Take 5 mg by mouth daily.     fluticasone (FLONASE) 50 MCG/ACT nasal spray Place into both nostrils.     folic acid  (FOLVITE ) 1 MG tablet Take 1 mg by mouth daily.     hydroxychloroquine  (PLAQUENIL ) 200 MG tablet Take 200  mg by mouth 2 (two) times daily.     ipratropium (ATROVENT) 0.06 % nasal spray Place 1 spray into both nostrils 2 (two) times daily.     loratadine (CLARITIN) 10 MG tablet Take 10 mg by mouth in the morning.     methotrexate (RHEUMATREX) 2.5 MG tablet Take 15 mg by mouth every Saturday. Caution:Chemotherapy. Protect from light.     mometasone  (ELOCON ) 0.1 % cream APPLY TO AFFECTED AREA TOPICALLY EVERY DAY 15 g 1   Multiple Vitamins-Minerals (PRESERVISION AREDS 2) CAPS Take 1 capsule by mouth in the morning and at bedtime.     omeprazole (PRILOSEC) 20 MG capsule Take 20 mg by mouth in the morning and at  bedtime.     sildenafil (VIAGRA) 100 MG tablet Take 100 mg by mouth daily as needed.     tadalafil (CIALIS) 5 MG tablet Take 5 mg by mouth daily.     Multiple Vitamin (MULTIVITAMIN WITH MINERALS) TABS tablet Take 1 tablet by mouth daily. (Patient not taking: Reported on 10/04/2023)     ondansetron  (ZOFRAN ) 4 MG tablet Take 1 tablet (4 mg total) by mouth every 8 (eight) hours as needed for nausea or vomiting. (Patient not taking: Reported on 10/04/2023) 40 tablet 0   amoxicillin  (AMOXIL ) 500 MG tablet Take 4 tablets by mouth 1 hour prior to dental procedure. 4 tablet 2   methylPREDNISolone  (MEDROL  DOSEPAK) 4 MG TBPK tablet Take as directed 21 tablet 0   tamsulosin (FLOMAX) 0.4 MG CAPS capsule Take 0.4 mg by mouth at bedtime. (Patient not taking: Reported on 03/29/2023)     No facility-administered medications prior to visit.   No Known Allergies   Objective:   Today's Vitals   10/04/23 1042  BP: (!) 140/78  Pulse: (!) 56  Temp: (!) 97.3 F (36.3 C)  TempSrc: Temporal  SpO2: 100%  Weight: 192 lb 9.6 oz (87.4 kg)  Height: 5' 8 (1.727 m)   Body mass index is 29.28 kg/m.   General: Well developed, well nourished. No acute distress. Abdomen: Soft. Bowel sounds positive, normal pitch, but decreased frequency. No hepatosplenomegaly.   Mild discomfort in the mid lower abdomen. No rebound or guarding. Back: Straight. No CVA tenderness bilaterally. Rectal: No masses. Prostate gland is smooth. Hemoccult -. Psych: Alert and oriented. Normal mood and affect.  Health Maintenance Due  Topic Date Due   Zoster Vaccines- Shingrix (1 of 2) Never done   Medicare Annual Wellness (AWV)  10/25/2023     Assessment & Plan:  1. Lower abdominal pain (Primary) 2. Diarrhea, unspecified type  The etiology of Mr. Kolbe discomfort and intermittent diarrhea is unclear. I will check labs, stool studies, and an abdominal CT scan to screen for common etiologies. As this could represent, diverticulitis, I  recommend he switch to a clear liquid diet over the next several days. We will base further work-up or management on the results of these studies.  - Basic metabolic panel with GFR - CBC with Differential/Platelet - C. difficile GDH and Toxin A/B; Future - Calprotectin, Fecal; Future - Gastrointestinal Pathogen Pnl RT, PCR; Future - Giardia antigen; Future  Return for Follow-up after testing/imaging.   Garnette CHRISTELLA Simpler, MD

## 2023-10-05 ENCOUNTER — Other Ambulatory Visit: Payer: Self-pay | Admitting: Family Medicine

## 2023-10-05 ENCOUNTER — Encounter: Payer: Self-pay | Admitting: Family Medicine

## 2023-10-05 DIAGNOSIS — R1030 Lower abdominal pain, unspecified: Secondary | ICD-10-CM | POA: Diagnosis not present

## 2023-10-05 DIAGNOSIS — R197 Diarrhea, unspecified: Secondary | ICD-10-CM | POA: Diagnosis not present

## 2023-10-07 ENCOUNTER — Other Ambulatory Visit: Payer: Self-pay | Admitting: Family Medicine

## 2023-10-07 DIAGNOSIS — R1030 Lower abdominal pain, unspecified: Secondary | ICD-10-CM | POA: Diagnosis not present

## 2023-10-07 DIAGNOSIS — R197 Diarrhea, unspecified: Secondary | ICD-10-CM | POA: Diagnosis not present

## 2023-10-10 LAB — C. DIFFICILE GDH AND TOXIN A/B
GDH ANTIGEN: NOT DETECTED
MICRO NUMBER:: 17023759
SPECIMEN QUALITY:: ADEQUATE
TOXIN A AND B: NOT DETECTED

## 2023-10-10 LAB — HOUSE ACCOUNT TRACKING

## 2023-10-11 ENCOUNTER — Ambulatory Visit: Payer: Self-pay | Admitting: Family Medicine

## 2023-10-11 LAB — GIARDIA ANTIGEN
MICRO NUMBER:: 17022885
RESULT:: NOT DETECTED
SPECIMEN QUALITY:: ADEQUATE

## 2023-10-11 LAB — GASTROINTESTINAL PATHOGEN PNL

## 2023-10-11 LAB — HOUSE ACCOUNT TRACKING

## 2023-10-12 ENCOUNTER — Ambulatory Visit
Admission: RE | Admit: 2023-10-12 | Discharge: 2023-10-12 | Disposition: A | Discharge status: Home / Self Care | Source: Ambulatory Visit | Attending: Family Medicine | Admitting: Family Medicine

## 2023-10-12 DIAGNOSIS — R1030 Lower abdominal pain, unspecified: Secondary | ICD-10-CM

## 2023-10-12 DIAGNOSIS — N400 Enlarged prostate without lower urinary tract symptoms: Secondary | ICD-10-CM | POA: Diagnosis not present

## 2023-10-12 DIAGNOSIS — R197 Diarrhea, unspecified: Secondary | ICD-10-CM

## 2023-10-12 MED ORDER — IOPAMIDOL (ISOVUE-370) INJECTION 76%
75.0000 mL | Freq: Once | INTRAVENOUS | Status: AC | PRN
  Administered 2023-10-12: 75 mL via INTRAVENOUS

## 2023-10-13 ENCOUNTER — Encounter: Payer: Self-pay | Admitting: Family Medicine

## 2023-10-13 DIAGNOSIS — H35373 Puckering of macula, bilateral: Secondary | ICD-10-CM | POA: Diagnosis not present

## 2023-10-13 DIAGNOSIS — H02831 Dermatochalasis of right upper eyelid: Secondary | ICD-10-CM | POA: Diagnosis not present

## 2023-10-13 DIAGNOSIS — Z79899 Other long term (current) drug therapy: Secondary | ICD-10-CM | POA: Diagnosis not present

## 2023-10-13 DIAGNOSIS — E278 Other specified disorders of adrenal gland: Secondary | ICD-10-CM | POA: Insufficient documentation

## 2023-10-13 DIAGNOSIS — H04123 Dry eye syndrome of bilateral lacrimal glands: Secondary | ICD-10-CM | POA: Diagnosis not present

## 2023-10-13 DIAGNOSIS — H353131 Nonexudative age-related macular degeneration, bilateral, early dry stage: Secondary | ICD-10-CM | POA: Diagnosis not present

## 2023-10-13 DIAGNOSIS — H43813 Vitreous degeneration, bilateral: Secondary | ICD-10-CM | POA: Diagnosis not present

## 2023-10-13 DIAGNOSIS — H52203 Unspecified astigmatism, bilateral: Secondary | ICD-10-CM | POA: Diagnosis not present

## 2023-10-13 DIAGNOSIS — H11123 Conjunctival concretions, bilateral: Secondary | ICD-10-CM | POA: Diagnosis not present

## 2023-10-13 DIAGNOSIS — M069 Rheumatoid arthritis, unspecified: Secondary | ICD-10-CM | POA: Diagnosis not present

## 2023-10-13 DIAGNOSIS — R197 Diarrhea, unspecified: Secondary | ICD-10-CM | POA: Diagnosis not present

## 2023-10-13 DIAGNOSIS — H26493 Other secondary cataract, bilateral: Secondary | ICD-10-CM | POA: Diagnosis not present

## 2023-10-13 DIAGNOSIS — R1030 Lower abdominal pain, unspecified: Secondary | ICD-10-CM | POA: Diagnosis not present

## 2023-10-13 LAB — C. DIFFICILE GDH AND TOXIN A/B

## 2023-10-13 LAB — GIARDIA ANTIGEN

## 2023-10-13 LAB — GASTROINTESTINAL PATHOGEN PNL

## 2023-10-13 NOTE — Addendum Note (Signed)
 Addended by: ALTO PARODY D on: 10/13/2023 10:18 AM   Modules accepted: Orders

## 2023-10-14 ENCOUNTER — Telehealth: Payer: Self-pay

## 2023-10-14 NOTE — Telephone Encounter (Signed)
 Contacted pt to inform him his results are in from his CT scan but Dr. Thedora has to interpret the results to his MA and she will reach out with the results. Pt expressed understanding NFN

## 2023-10-16 LAB — CALPROTECTIN, FECAL: Calprotectin, Fecal: 218 ug/g — ABNORMAL HIGH (ref 0–120)

## 2023-10-17 DIAGNOSIS — R195 Other fecal abnormalities: Secondary | ICD-10-CM | POA: Insufficient documentation

## 2023-10-26 ENCOUNTER — Encounter: Admitting: Family Medicine

## 2023-10-31 ENCOUNTER — Ambulatory Visit (INDEPENDENT_AMBULATORY_CARE_PROVIDER_SITE_OTHER): Payer: Medicare PPO

## 2023-10-31 DIAGNOSIS — Z0000 Encounter for general adult medical examination without abnormal findings: Secondary | ICD-10-CM

## 2023-10-31 NOTE — Progress Notes (Signed)
 Subjective:   Glendel Jaggers is a 83 y.o. who presents for a Medicare Wellness preventive visit.  As a reminder, Annual Wellness Visits don't include a physical exam, and some assessments may be limited, especially if this visit is performed virtually. We may recommend an in-person follow-up visit with your provider if needed.  Visit Complete: Virtual I connected with  Donna Mars on 10/31/23 by a video and audio enabled telemedicine application and verified that I am speaking with the correct person using two identifiers.  Patient Location: Home  Provider Location: Office/Clinic  I discussed the limitations of evaluation and management by telemedicine. The patient expressed understanding and agreed to proceed.  Vital Signs: Because this visit was a virtual/telehealth visit, some criteria may be missing or patient reported. Any vitals not documented were not able to be obtained and vitals that have been documented are patient reported.    Persons Participating in Visit: Patient.  AWV Questionnaire: No: Patient Medicare AWV questionnaire was not completed prior to this visit.  Cardiac Risk Factors include: advanced age (>38men, >90 women);male gender     Objective:    Today's Vitals   There is no height or weight on file to calculate BMI.     10/31/2023    8:48 AM 10/25/2022    8:47 AM 08/30/2022    9:29 PM 08/12/2022   11:14 AM 11/20/2021    6:11 AM 11/10/2021    2:24 PM 10/21/2021    9:25 AM  Advanced Directives  Does Patient Have a Medical Advance Directive? Yes Yes  Yes Yes Yes Yes  Type of Estate agent of [place];Living will Healthcare Power of [place];Living will Living will Healthcare Power of [place];Living will Healthcare Power of [place];Living will Healthcare Power of [place];Living will Healthcare Power of [place];Living will  Does patient want to make changes to medical advance directive?     No - Patient declined    Copy of  Healthcare Power of Attorney in Chart? No - copy requested No - copy requested  No - copy requested No - copy requested No - copy requested No - copy requested    Current Medications (verified) Outpatient Encounter Medications as of 10/31/2023  Medication Sig   Azelastine  HCl 137 MCG/SPRAY SOLN Place 1 spray into both nostrils 2 (two) times daily.   cetirizine (ZYRTEC) 10 MG tablet Take 10 mg by mouth at bedtime.   finasteride  (PROSCAR ) 5 MG tablet Take 5 mg by mouth daily.   fluticasone (FLONASE) 50 MCG/ACT nasal spray Place into both nostrils.   folic acid  (FOLVITE ) 1 MG tablet Take 1 mg by mouth daily.   hydroxychloroquine  (PLAQUENIL ) 200 MG tablet Take 200 mg by mouth 2 (two) times daily.   ipratropium (ATROVENT) 0.06 % nasal spray Place 1 spray into both nostrils 2 (two) times daily.   loratadine (CLARITIN) 10 MG tablet Take 10 mg by mouth in the morning.   methotrexate (RHEUMATREX) 2.5 MG tablet Take 15 mg by mouth every Saturday. Caution:Chemotherapy. Protect from light.   mometasone  (ELOCON ) 0.1 % cream APPLY TO AFFECTED AREA TOPICALLY EVERY DAY   Multiple Vitamins-Minerals (PRESERVISION AREDS 2) CAPS Take 1 capsule by mouth in the morning and at bedtime.   omeprazole (PRILOSEC) 20 MG capsule Take 20 mg by mouth in the morning and at bedtime.   sildenafil (VIAGRA) 100 MG tablet Take 100 mg by mouth daily as needed.   tadalafil (CIALIS) 5 MG tablet Take 5 mg by mouth daily.   Multiple Vitamin (  MULTIVITAMIN WITH MINERALS) TABS tablet Take 1 tablet by mouth daily. (Patient not taking: Reported on 10/31/2023)   ondansetron  (ZOFRAN ) 4 MG tablet Take 1 tablet (4 mg total) by mouth every 8 (eight) hours as needed for nausea or vomiting. (Patient not taking: Reported on 10/31/2023)   No facility-administered encounter medications on file as of 10/31/2023.    Allergies (verified) Patient has no known allergies.   History: Past Medical History:  Diagnosis Date   Cancer (HCC)    Skin  cancer left ear and nose   Complication of anesthesia    Sinus Infection after back surgery 11/2021   GERD (gastroesophageal reflux disease)    Rheumatoid arthritis (HCC)    Past Surgical History:  Procedure Laterality Date   COLONOSCOPY  2021   EYE SURGERY Bilateral    cataract extraction   HEMORRHOID SURGERY     LAMINECTOMY WITH POSTERIOR LATERAL ARTHRODESIS LEVEL 3 N/A 11/20/2021   Procedure: LUMBAR TWO THROUGH LUMBAR THREE, LUMBAR THREE THROUGH LUMBAR FOUR, LUMBAR FOUR THROUGH LUMBAR FIVE LAMINECTOMY WITH FACETECTOMY, POSTERIOR SEGMENTAL INSTRUMENTATION, POSTEROLATERAL ARTHRODESIS;  Surgeon: Lanis Pupa, MD;  Location: [facility] OR;  Service: Neurosurgery;  Laterality: N/A;  3C   LYMPH NODE BIOPSY     Neck   MOHS SURGERY Left 2016   Ear. 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   Procedure: LEFT TOTAL KNEE REPLACEMENT;  Surgeon: Jerri Kay HERO, MD;  Location: [facility] OR;  Service: Orthopedics;  Laterality: Left;   TRIGGER FINGER RELEASE Right    3rd   Family History  Problem Relation Age of Onset   Rheum arthritis Mother    Diabetes Mother    Cancer Sister        Lung   Cancer Brother        Lung   Social History   Socioeconomic History   Marital status: Married    Spouse name: Not on file   Number of children: 1   Years of education: Not on file   Highest education level: Not on file  Occupational History   Not on file  Tobacco Use   Smoking status: Never    Passive exposure: Never   Smokeless tobacco: Never  Vaping Use   Vaping status: Never Used  Substance and Sexual Activity   Alcohol use: Not Currently    Comment: none   Drug use: Never   Sexual activity: Yes  Other Topics Concern   Not on file  Social History Narrative   Retired- Haematologist   Social Drivers of Corporate investment banker Strain: Low Risk  (10/31/2023)   Overall Financial Resource Strain (CARDIA)    Difficulty of Paying Living Expenses: Not hard at  all  Food Insecurity: No Food Insecurity (10/31/2023)   Hunger Vital Sign    Worried About Running Out of Food in the Last Year: Never true    Ran Out of Food in the Last Year: Never true  Transportation Needs: No Transportation Needs (10/31/2023)   PRAPARE - Administrator, Civil Service (Medical): No    Lack of Transportation (Non-Medical): No  Physical Activity: Insufficiently Active (10/31/2023)   Exercise Vital Sign    Days of Exercise per Week: 2 days    Minutes of Exercise per Session: 50 min  Stress: No Stress Concern Present (10/31/2023)   [employer] of Occupational Health - Occupational Stress Questionnaire    Feeling of Stress: Not at all  Social Connections: Moderately Integrated (  10/31/2023)   Social Connection and Isolation Panel    Frequency of Communication with Friends and Family: More than three times a week    Frequency of Social Gatherings with Friends and Family: Twice a week    Attends Religious Services: More than 4 times per year    Active Member of [employer] or Organizations: No    Attends Engineer, structural: Never    Marital Status: Married    Tobacco Counseling Counseling given: Not Answered    Clinical Intake:  Pre-visit preparation completed: Yes  Pain : No/denies pain     Nutritional Risks: None Diabetes: No  Lab Results  Component Value Date   HGBA1C 5.7 (H) 08/06/2022     How often do you need to have someone help you when you read instructions, pamphlets, or other written materials from your doctor or pharmacy?: 1 - Never  Interpreter Needed?: No  Information entered by :: NAllen LPN   Activities of Daily Living     10/31/2023    8:43 AM  In your present state of health, do you have any difficulty performing the following activities:  Hearing? 0  Vision? 0  Difficulty concentrating or making decisions? 0  Walking or climbing stairs? 0  Dressing or bathing? 0  Doing errands, shopping? 0   Preparing Food and eating ? N  Using the Toilet? N  In the past six months, have you accidently leaked urine? N  Do you have problems with loss of bowel control? N  Managing your Medications? N  Managing your Finances? N  Housekeeping or managing your Housekeeping? N    Patient Care Team: Thedora Garnette HERO, MD as PCP - General (Family Medicine) Veronia Bottoms, MD as Referring Physician (Rheumatology)  I have updated your Care Teams any recent Medical Services you may have received from other providers in the past year.     Assessment:   This is a routine wellness examination for [place].  Hearing/Vision screen Hearing Screening - Comments:: Denies hearing issues Vision Screening - Comments:: Regular eye exams, Dr. Janit   Goals Addressed             This Visit's Progress    Patient Stated       10/31/2023, continue exercising and take medications as prescribed       Depression Screen     10/31/2023    8:49 AM 03/29/2023    1:02 PM 10/25/2022    8:48 AM 08/16/2022    9:59 AM 06/17/2022   10:26 AM 10/21/2021    9:25 AM 10/04/2020   11:19 AM  PHQ 2/9 Scores  PHQ - 2 Score 0 0 0 0 0 0 0  PHQ- 9 Score 0  0        Fall Risk     10/31/2023    8:48 AM 03/29/2023    1:02 PM 10/25/2022    8:47 AM 08/16/2022    9:59 AM 06/17/2022   10:26 AM  Fall Risk   Falls in the past year? 0 0 0 0 0  Number falls in past yr: 0 0 0 0 0  Injury with Fall? 0 0 0 0 0  Risk for fall due to : Medication side effect No Fall Risks Medication side effect No Fall Risks No Fall Risks  Follow up Falls evaluation completed;Falls prevention discussed Falls evaluation completed Falls prevention discussed;Falls evaluation completed Falls evaluation completed Falls evaluation completed    MEDICARE RISK AT HOME:  Medicare Risk  at Home Any stairs in or around the home?: No If so, are there any without handrails?: No Home free of loose throw rugs in walkways, pet beds, electrical cords, etc?:  Yes Adequate lighting in your home to reduce risk of falls?: Yes Life alert?: No Use of a cane, walker or w/c?: No Grab bars in the bathroom?: Yes Shower chair or bench in shower?: Yes Elevated toilet seat or a handicapped toilet?: No  TIMED UP AND GO:  Was the test performed?  No  Cognitive Function: 6CIT completed        10/31/2023    8:50 AM 10/25/2022    8:48 AM 10/21/2021    9:26 AM 10/04/2020   11:24 AM  6CIT Screen  What Year? 0 points 0 points 0 points 0 points  What month? 0 points 0 points 0 points 0 points  What time? 0 points 0 points 0 points 0 points  Count back from 20 0 points 0 points 0 points 0 points  Months in reverse 0 points 2 points 0 points 0 points  Repeat phrase 2 points 2 points 0 points 4 points  Total Score 2 points 4 points 0 points 4 points    Immunizations Immunization History  Administered Date(s) Administered   PFIZER(Purple Top)SARS-COV-2 Vaccination 03/07/2019, 04/13/2019   PNEUMOCOCCAL CONJUGATE-20 01/13/2021   Pfizer Covid-19 Vaccine Bivalent Booster 76yrs & up 02/06/2020   Tdap 01/12/2015    Screening Tests Health Maintenance  Topic Date Due   Zoster Vaccines- Shingrix (1 of 2) Never done   COVID-19 Vaccine (4 - 2025-26 season) 09/12/2023   Influenza Vaccine  04/10/2024 (Originally 08/12/2023)   Medicare Annual Wellness (AWV)  10/30/2024   DTaP/Tdap/Td (2 - Td or Tdap) 01/11/2025   Pneumococcal Vaccine: 50+ Years  Completed   Meningococcal B Vaccine  Aged Out    Health Maintenance Items Addressed: Vaccines Due: shingles, Declines covid vaccine  Additional Screening:  Vision Screening: Recommended annual ophthalmology exams for early detection of glaucoma and other disorders of the eye. Is the patient up to date with their annual eye exam?  Yes  Who is the provider or what is the name of the office in which the patient attends annual eye exams? Dr. Janit  Dental Screening: Recommended annual dental exams for proper oral  hygiene  Community Resource Referral / Chronic Care Management: CRR required this visit?  No   CCM required this visit?  No   Plan:    I have personally reviewed and noted the following in the patient's chart:   Medical and social history Use of alcohol, tobacco or illicit drugs  Current medications and supplements including opioid prescriptions. Patient is not currently taking opioid prescriptions. Functional ability and status Nutritional status Physical activity Advanced directives List of other physicians Hospitalizations, surgeries, and ER visits in previous 12 months Vitals Screenings to include cognitive, depression, and falls Referrals and appointments  In addition, I have reviewed and discussed with patient certain preventive protocols, quality metrics, and best practice recommendations. A written personalized care plan for preventive services as well as general preventive health recommendations were provided to patient.   Ardella FORBES Dawn, LPN   [id number]   After Visit Summary: (Pick Up) Due to this being a telephonic visit, with patients personalized plan was offered to patient and patient has requested to Pick up at office.  Notes: Nothing significant to report at this time.

## 2023-10-31 NOTE — Patient Instructions (Signed)
 Joshua Ruiz,  Thank you for taking the time for your Medicare Wellness Visit. I appreciate your continued commitment to your health goals. Please review the care plan we discussed, and feel free to reach out if I can assist you further.  Medicare recommends these wellness visits once per year to help you and your care team stay ahead of potential health issues. These visits are designed to focus on prevention, allowing your provider to concentrate on managing your acute and chronic conditions during your regular appointments.  Please note that Annual Wellness Visits do not include a physical exam. Some assessments may be limited, especially if the visit was conducted virtually. If needed, we may recommend a separate in-person follow-up with your provider.  Ongoing Care Seeing your primary care provider every 3 to 6 months helps us  monitor your health and provide consistent, personalized care.   Referrals If a referral was made during today's visit and you haven't received any updates within two weeks, please contact the referred provider directly to check on the status.  Recommended Screenings:  Health Maintenance  Topic Date Due   Zoster (Shingles) Vaccine (1 of 2) Never done   COVID-19 Vaccine (4 - 2025-26 season) 09/12/2023   Flu Shot  04/10/2024*   Medicare Annual Wellness Visit  10/30/2024   DTaP/Tdap/Td vaccine (2 - Td or Tdap) 01/11/2025   Pneumococcal Vaccine for age over 64  Completed   Meningitis B Vaccine  Aged Out  *Topic was postponed. The date shown is not the original due date.       10/31/2023    8:48 AM  Advanced Directives  Does Patient Have a Medical Advance Directive? Yes  Type of Estate agent of [place];Living will  Copy of Healthcare Power of Attorney in Chart? No - copy requested   Advance Care Planning is important because it: Ensures you receive medical care that aligns with your values, goals, and preferences. Provides guidance  to your family and loved ones, reducing the emotional burden of decision-making during critical moments.  Vision: Annual vision screenings are recommended for early detection of glaucoma, cataracts, and diabetic retinopathy. These exams can also reveal signs of chronic conditions such as diabetes and high blood pressure.  Dental: Annual dental screenings help detect early signs of oral cancer, gum disease, and other conditions linked to overall health, including heart disease and diabetes.  Please see the attached documents for additional preventive care recommendations.

## 2023-11-12 ENCOUNTER — Other Ambulatory Visit: Payer: Self-pay | Admitting: Orthopaedic Surgery

## 2023-11-14 ENCOUNTER — Encounter: Payer: Self-pay | Admitting: Radiology

## 2023-11-14 DIAGNOSIS — M8949 Other hypertrophic osteoarthropathy, multiple sites: Secondary | ICD-10-CM | POA: Diagnosis not present

## 2023-11-14 DIAGNOSIS — Z79899 Other long term (current) drug therapy: Secondary | ICD-10-CM | POA: Diagnosis not present

## 2023-11-14 DIAGNOSIS — M069 Rheumatoid arthritis, unspecified: Secondary | ICD-10-CM | POA: Diagnosis not present

## 2023-11-14 DIAGNOSIS — M47816 Spondylosis without myelopathy or radiculopathy, lumbar region: Secondary | ICD-10-CM | POA: Diagnosis not present

## 2023-11-15 ENCOUNTER — Telehealth: Payer: Self-pay

## 2023-11-15 NOTE — Telephone Encounter (Signed)
 Called patient and he was given the number to GI and had already called and got an appointment.  Dm/cma

## 2023-11-15 NOTE — Telephone Encounter (Signed)
 Copied from CRM #[id number]. Topic: Referral - Status >> Nov 15, 2023  8:23 AM Pinkey ORN wrote: Reason for CRM: Gastro Referral >> Nov 15, 2023  8:25 AM Pinkey ORN wrote: Patient called requesting an update on his referral to see the gastro specialist, states it's been over a month and he hasn't heard anything in regards to scheduling. Please follow up with patient.

## 2023-11-23 NOTE — Progress Notes (Signed)
 Chief Complaint: Diarrhea and elevated fecal calprotectin  HPI:    Joshua Ruiz is an 83 year old male with a past medical history as listed below including GERD and rheumatoid arthritis, who was referred to me by Thedora Garnette HERO, MD for a complaint of diarrhea and elevated fecal calprotectin.      10/04/2023 CBC with a hemoglobin 12.2 (around patient's baseline over the past couple of years), MCV normal.  BMP normal.    10/07/2018 5 GI pathogen panel, C. difficile, Giardia all negative.    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    10/13/2023 fecal calprotectin elevated 218.    Today, the patient presents to clinic and tells me actually his diarrhea is gone.  Describes that the week of August 16th everyone in his house came down with a viral upper respiratory infection and also had some diarrhea.  His seemed to linger longer than everyone else is.  Tells me he took a lot of Pepto-Bismol and noticed his stools being kind of dark and sticky, since stopping the Pepto-Bismol this has changed back to normal.  He was having pretty frequent diarrhea, but then the month of October turned out to be pretty good, he would have maybe a day here and there maybe once a week where he would have some loose stools for an hour and then it would go away.  The last time this happened was about a week and a half ago.  When he does have loose stools are typically accompanied by some lower abdominal discomfort but the last 2 times this happened he has noticed that it happened after eating some offending food like Mexican or fried fish.  It has never woken him up from his sleep, he has not lost any weight no blood in his stool.  Scribes being very up-to-date with his colonoscopies, apparently follows with [facility] gastroenterology and had a colonoscopy every 5 years until he turns 16.    Patient is due to have a dental procedure in a week and has been  given some prophylactic antibiotics.    Denies fever, chills or weight loss.  Past Medical History:  Diagnosis Date   Cancer [facility])    Skin cancer left ear and nose   Complication of anesthesia    Sinus Infection after back surgery 11/2021   GERD (gastroesophageal reflux disease)    Rheumatoid arthritis (HCC)     Past Surgical History:  Procedure Laterality Date   COLONOSCOPY  2021   EYE SURGERY Bilateral    cataract extraction   HEMORRHOID SURGERY     LAMINECTOMY WITH POSTERIOR LATERAL ARTHRODESIS LEVEL 3 N/A 11/20/2021   Procedure: LUMBAR TWO THROUGH LUMBAR THREE, LUMBAR THREE THROUGH LUMBAR FOUR, LUMBAR FOUR THROUGH LUMBAR FIVE LAMINECTOMY WITH FACETECTOMY, POSTERIOR SEGMENTAL INSTRUMENTATION, POSTEROLATERAL ARTHRODESIS;  Surgeon: Lanis Pupa, MD;  Location: [facility] OR;  Service: Neurosurgery;  Laterality: N/A;  3C   LYMPH NODE BIOPSY     Neck   MOHS SURGERY Left 2016   Ear. 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     cetirizine (ZYRTEC) 10 MG tablet Take 10 mg by mouth at bedtime.     finasteride  (PROSCAR ) 5 MG tablet Take 5 mg by mouth daily.     fluticasone (FLONASE) 50 MCG/ACT nasal spray Place into both nostrils.     folic acid  (FOLVITE ) 1 MG tablet Take 1 mg by mouth daily.     hydroxychloroquine  (PLAQUENIL ) 200 MG tablet Take 200 mg by mouth 2 (two) times daily.     ipratropium (ATROVENT) 0.06 % nasal spray Place 1 spray into both nostrils 2 (two) times daily.     loratadine (CLARITIN) 10 MG tablet Take 10 mg  by mouth in the morning.     methotrexate (RHEUMATREX) 2.5 MG tablet Take 15 mg by mouth every Saturday. Caution:Chemotherapy. Protect from light.     mometasone  (ELOCON ) 0.1 % cream APPLY TO AFFECTED AREA TOPICALLY EVERY DAY 15 g 1   Multiple Vitamin (MULTIVITAMIN WITH MINERALS) TABS tablet Take 1 tablet by mouth daily. (Patient not taking: Reported on 10/31/2023)     Multiple Vitamins-Minerals (PRESERVISION AREDS 2) CAPS Take 1 capsule by mouth in the morning and at bedtime.     omeprazole (PRILOSEC) 20 MG capsule Take 20 mg by mouth in the morning and at bedtime.     ondansetron  (ZOFRAN ) 4 MG tablet Take 1 tablet (4 mg total) by mouth every 8 (eight) hours as needed for nausea or vomiting. (Patient not taking: Reported on 10/31/2023) 40 tablet 0   sildenafil (VIAGRA) 100 MG tablet Take 100 mg by mouth daily as needed.     tadalafil (CIALIS) 5 MG tablet Take 5 mg by mouth daily.     No current facility-administered medications for this visit.    Allergies as of 11/24/2023   (No Known Allergies)    Family History  Problem Relation Age of Onset   Rheum arthritis Mother    Diabetes Mother    Cancer Sister        Lung   Cancer Brother        Lung    Social History   Socioeconomic History   Marital status: Married    Spouse name: Not on file   Number of children: 1   Years of education: Not on file   Highest education level: Not on file  Occupational History   Not on file  Tobacco Use   Smoking status: Never    Passive exposure: Never   Smokeless tobacco: Never  Vaping Use   Vaping status: Never Used  Substance and Sexual Activity   Alcohol use: Not Currently    Comment: none   Drug use: Never   Sexual activity: Yes  Other Topics Concern   Not on file  Social History Narrative   Retired- Haematologist   Social Drivers of Corporate Investment Banker Strain: Low Risk  (10/31/2023)   Overall Financial Resource Strain (CARDIA)    Difficulty of Paying Living Expenses:  Not hard at all  Food Insecurity: No Food Insecurity (10/31/2023)   Hunger Vital Sign    Worried About Running Out of Food in the Last Year: Never true    Ran Out of Food in the Last Year: Never true  Transportation Needs: No Transportation Needs (10/31/2023)   PRAPARE - Administrator, Civil Service (Medical): No    Lack of Transportation (Non-Medical): No  Physical Activity: Insufficiently Active (  10/31/2023)   Exercise Vital Sign    Days of Exercise per Week: 2 days    Minutes of Exercise per Session: 50 min  Stress: No Stress Concern Present (10/31/2023)   [employer] of Occupational Health - Occupational Stress Questionnaire    Feeling of Stress: Not at all  Social Connections: Moderately Integrated (10/31/2023)   Social Connection and Isolation Panel    Frequency of Communication with Friends and Family: More than three times a week    Frequency of Social Gatherings with Friends and Family: Twice a week    Attends Religious Services: More than 4 times per year    Active Member of [employer] or Organizations: No    Attends Banker Meetings: Never    Marital Status: Married  Catering Manager Violence: Not At Risk (10/31/2023)   Humiliation, Afraid, Rape, and Kick questionnaire    Fear of Current or Ex-Partner: No    Emotionally Abused: No    Physically Abused: No    Sexually Abused: No    Review of Systems:    Constitutional: No weight loss, fever or chills Skin: No rash  Cardiovascular: No chest pain Respiratory: No SOB  Gastrointestinal: See HPI and otherwise negative Genitourinary: No dysuria  Neurological: No headache, dizziness or syncope Musculoskeletal: No new muscle or joint pain Hematologic: No bleeding Psychiatric: No history of depression or anxiety   Physical Exam:  Vital signs: BP (!) 144/76   Pulse 67   Ht 5' 8 (1.727 m)   Wt 193 lb (87.5 kg)   BMI 29.35 kg/m    Constitutional:   Pleasant elderly Caucasian male appears  to be in NAD, Well developed, Well nourished, alert and cooperative Head:  Normocephalic and atraumatic. Eyes:   PEERL, EOMI. No icterus. Conjunctiva pink. Ears:  Normal auditory acuity. Neck:  Supple Throat: Oral cavity and pharynx without inflammation, swelling or lesion.  Respiratory: Respirations even and unlabored. Lungs clear to auscultation bilaterally.   No wheezes, crackles, or rhonchi.  Cardiovascular: Normal S1, S2. No MRG. Regular rate and rhythm. No peripheral edema, cyanosis or pallor.  Gastrointestinal:  Soft, nondistended, nontender. No rebound or guarding. Normal bowel sounds. No appreciable masses or hepatomegaly. Rectal:  Not performed.  Msk:  Symmetrical without gross deformities. Without edema, no deformity or joint abnormality.  Neurologic:  Alert and  oriented x4;  grossly normal neurologically.  Skin:   Dry and intact without significant lesions or rashes. Psychiatric:  Demonstrates good judgement and reason without abnormal affect or behaviors.  RELEVANT LABS AND IMAGING: CBC    Component Value Date/Time   WBC 4.7 10/04/2023 1118   RBC 3.82 (L) 10/04/2023 1118   HGB 12.2 (L) 10/04/2023 1118   HCT 35.7 (L) 10/04/2023 1118   PLT 169.0 10/04/2023 1118   MCV 93.5 10/04/2023 1118   MCH 31.7 08/30/2022 2300   MCHC 34.3 10/04/2023 1118   RDW 14.1 10/04/2023 1118   LYMPHSABS 1.6 10/04/2023 1118   MONOABS 0.5 10/04/2023 1118   EOSABS 0.2 10/04/2023 1118   BASOSABS 0.0 10/04/2023 1118    CMP     Component Value Date/Time   NA 142 10/04/2023 1118   K 3.8 10/04/2023 1118   CL 107 10/04/2023 1118   CO2 27 10/04/2023 1118   GLUCOSE 81 10/04/2023 1118   BUN 14 10/04/2023 1118   CREATININE 0.87 10/04/2023 1118   CALCIUM 8.5 10/04/2023 1118   PROT 6.7 08/30/2022 2300   ALBUMIN  3.4 (L) 08/30/2022 2300   AST  28 08/30/2022 2300   ALT 19 08/30/2022 2300   ALKPHOS 74 08/30/2022 2300   BILITOT 1.0 08/30/2022 2300   GFRNONAA >60 08/30/2022 2300     Assessment: 1.  Diarrhea: Symptoms of diarrhea for a couple of weeks around August 16 with URI symptoms, since then intermittent and actually none over the past week and a half, at that time elevated fecal calprotectin, negative GI path panel and Giardia, has been doing well with soft solid stools now  Plan: 1.  Discussed with patient that symptoms seem better now.  There is no need to put him under anesthesia for a colonoscopy if it is resolved.  If his symptoms of diarrhea were to come back or his abdominal pain then would recommend a colonoscopy for further evaluation given elevated fecal calprotectin lately. 2.  Would recommend he start over-the-counter Align once daily for the next 28 days.  He is going on some antibiotics for dental procedure next week I am worried this may throw him back into diarrhea. 3.  Continue Imodium as needed 4.  Patient will call and let us  know if he has any increase or worsening of symptoms. Assigned to Dr. Wilhelmenia today.  Joshua Failing, PA-C [facility] 11/23/2023, 11:19 AM  Cc: Thedora Garnette HERO, MD

## 2023-11-24 ENCOUNTER — Encounter: Payer: Self-pay | Admitting: Physician Assistant

## 2023-11-24 ENCOUNTER — Ambulatory Visit: Admitting: Physician Assistant

## 2023-11-24 VITALS — BP 144/76 | HR 67 | Ht 68.0 in | Wt 193.0 lb

## 2023-11-24 DIAGNOSIS — R197 Diarrhea, unspecified: Secondary | ICD-10-CM

## 2023-11-24 DIAGNOSIS — R195 Other fecal abnormalities: Secondary | ICD-10-CM | POA: Diagnosis not present

## 2023-11-24 NOTE — Progress Notes (Signed)
 Attending Physician's Attestation   I have reviewed the chart.   I agree with the Advanced Practitioner's note, impression, and recommendations with any updates as below. Very reasonable since he is doing well at this point to monitor.  Should symptoms recur at any time point, then at minimum repeat fecal calprotectin and infectious studies and if abnormal and still having symptoms, we will consider colonoscopic evaluation at that time.   Aloha Finner, MD [facility] Advanced Endoscopy Office # [phone]

## 2023-11-24 NOTE — Patient Instructions (Signed)
 Start Align daily for 1 month.  _______________________________________________________  If your blood pressure at your visit was 140/90 or greater, please contact your primary care physician to follow up on this.  _______________________________________________________  If you are age 83 or older, your body mass index should be between 23-30. Your Body mass index is 29.35 kg/m. If this is out of the aforementioned range listed, please consider follow up with your Primary Care Provider.  If you are age 59 or younger, your body mass index should be between 19-25. Your Body mass index is 29.35 kg/m. If this is out of the aformentioned range listed, please consider follow up with your Primary Care Provider.   ________________________________________________________  The [facility] providers would like to encourage you to use MYCHART to communicate with providers for non-urgent requests or questions.  Due to long hold times on the telephone, sending your provider a message by [facility] may be a faster and more efficient way to get a response.  Please allow 48 business hours for a response.  Please remember that this is for non-urgent requests.  _______________________________________________________  [facility] is using a team-based approach to care.  Your team is made up of your doctor and two to three APPS. Our APPS (Nurse Practitioners and Physician Assistants) work with your physician to ensure care continuity for you. They are fully qualified to address your health concerns and develop a treatment plan. They communicate directly with your gastroenterologist to care for you. Seeing the Advanced Practice Practitioners on your physician's team can help you by facilitating care more promptly, often allowing for earlier appointments, access to diagnostic testing, procedures, and other specialty referrals.

## 2023-11-28 DIAGNOSIS — R399 Unspecified symptoms and signs involving the genitourinary system: Secondary | ICD-10-CM | POA: Diagnosis not present

## 2023-12-05 DIAGNOSIS — N5201 Erectile dysfunction due to arterial insufficiency: Secondary | ICD-10-CM | POA: Diagnosis not present

## 2023-12-05 DIAGNOSIS — N401 Enlarged prostate with lower urinary tract symptoms: Secondary | ICD-10-CM | POA: Diagnosis not present

## 2023-12-05 DIAGNOSIS — R351 Nocturia: Secondary | ICD-10-CM | POA: Diagnosis not present

## 2023-12-05 DIAGNOSIS — R3912 Poor urinary stream: Secondary | ICD-10-CM | POA: Diagnosis not present

## 2024-01-01 IMAGING — MR MR LUMBAR SPINE W/O CM
4 of 5 series · 26 of 48 positions shown · non-contrast
Comparison: Lumbar spine radiographs 11/28/2020

CLINICAL DATA: Low back pain. Symptoms for greater than 6 weeks
with conservative therapy. Patient complains of pain into the right
lower extremity over the last 3-4 months.

EXAM:
MRI LUMBAR SPINE WITHOUT CONTRAST
TECHNIQUE: Multiplanar, multisequence MR imaging of the lumbar spine was
performed. No intravenous contrast was administered.

[Series 3: T2 · sagittal · 4.0mm · 1.09mm/px · 6 of 17 slices shown (1 of 2)]
[im 1/17]
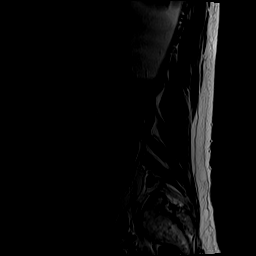
[im 4/17]
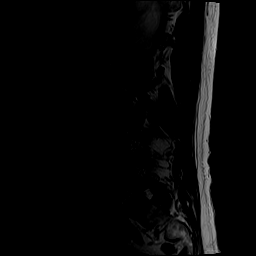
[im 7/17]
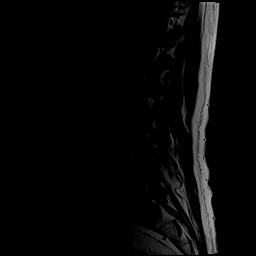
[im 10/17]
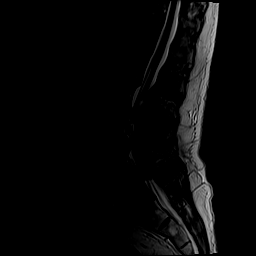
[im 13/17]
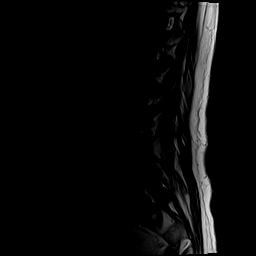
[im 17/17]
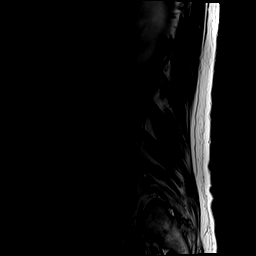

[Series 5: T1 · sagittal · 4.0mm · 1.09mm/px · 6 of 17 slices shown (1 of 2)]
[im 1/17]
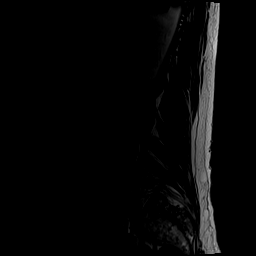
[im 4/17]
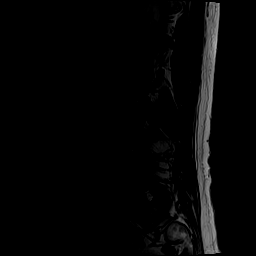
[im 7/17]
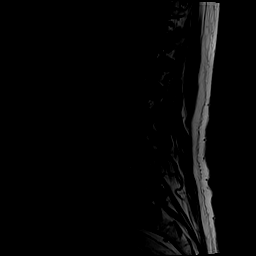
[im 10/17]
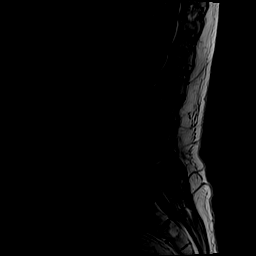
[im 13/17]
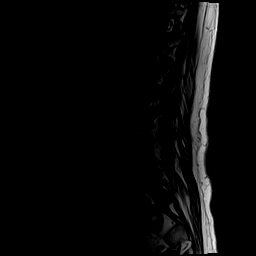
[im 17/17]
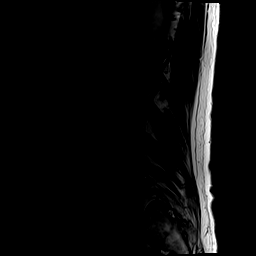

[Series 6: T2 · axial · 4.0mm · 0.39mm/px · z∈[-57,+172]mm · 9 of 44 slices shown (2 of 2)]
[im 1/44]
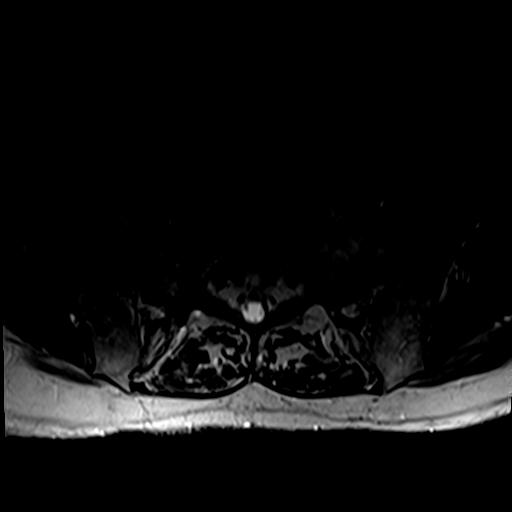
[im 7/44]
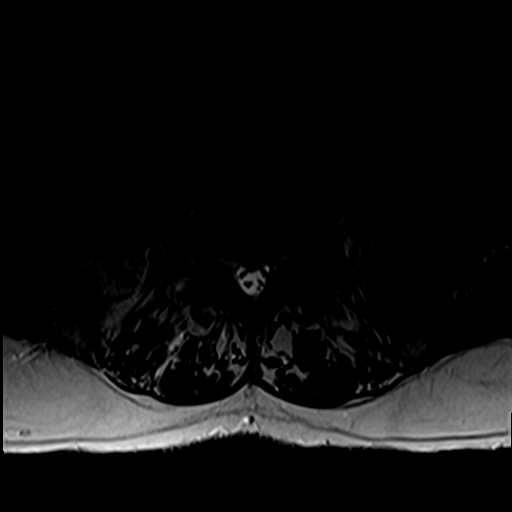
[im 13/44]
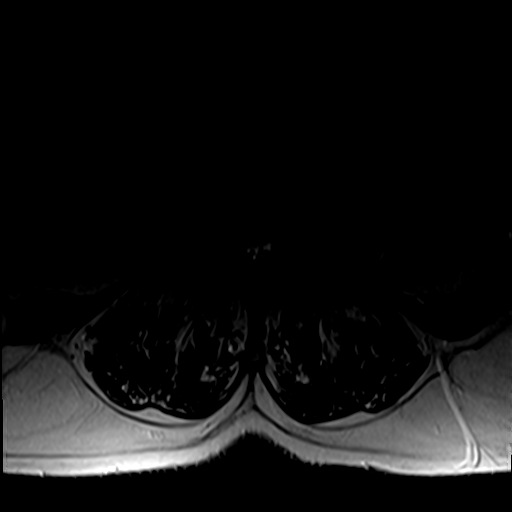
[im 19/44]
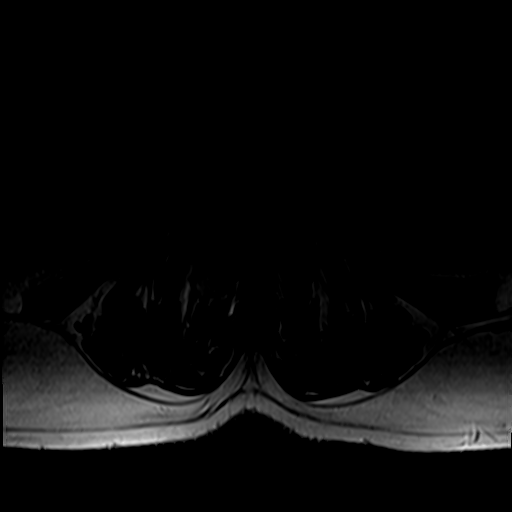
[im 22/44]
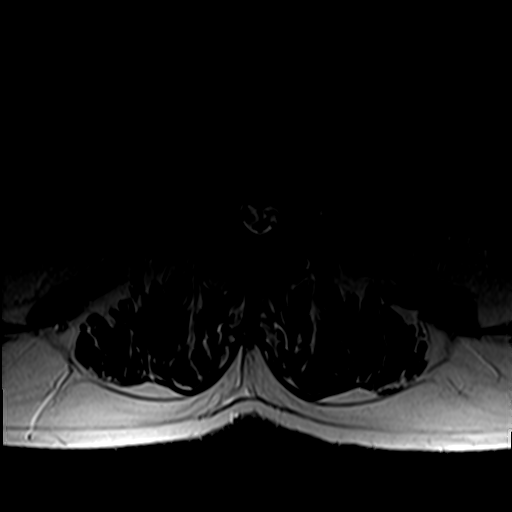
[im 25/44]
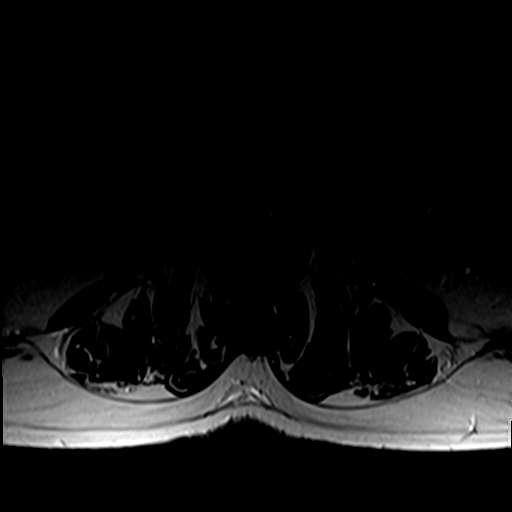
[im 31/44]
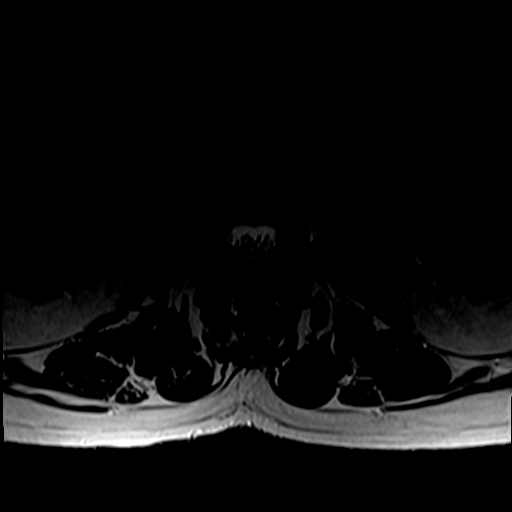
[im 37/44]
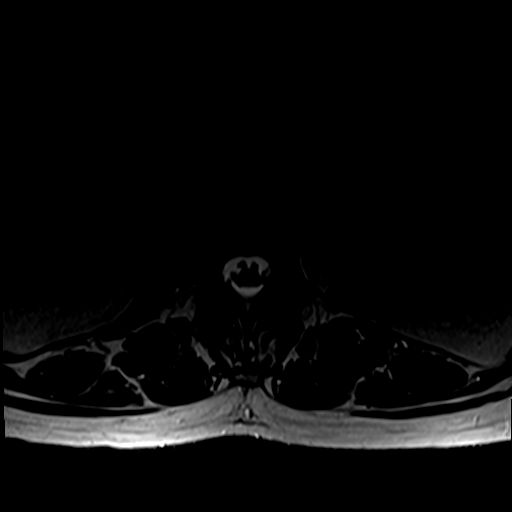
[im 44/44]
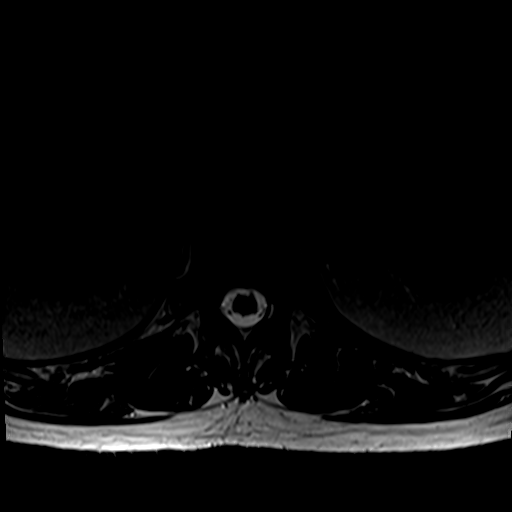

[Series 7: T1 · axial · 4.0mm · 0.39mm/px · z∈[-57,+138]mm · 5 of 44 slices shown (2 of 2)]
[im 1/44]
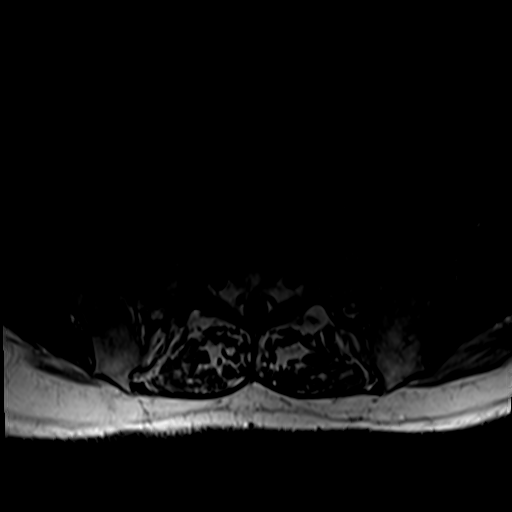
[im 7/44]
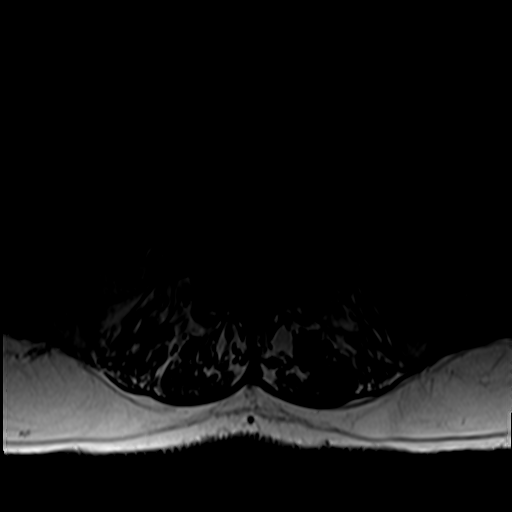
[im 13/44]
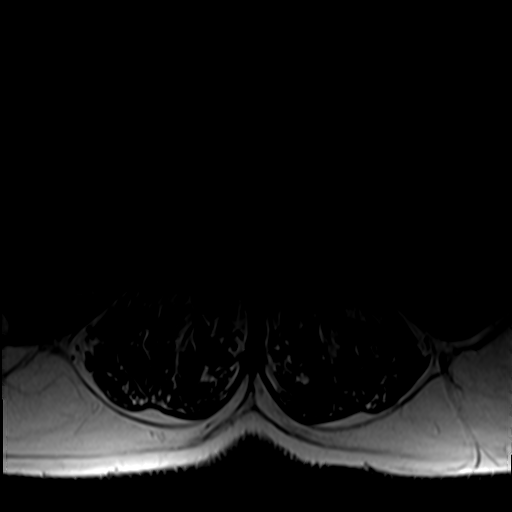
[im 22/44]
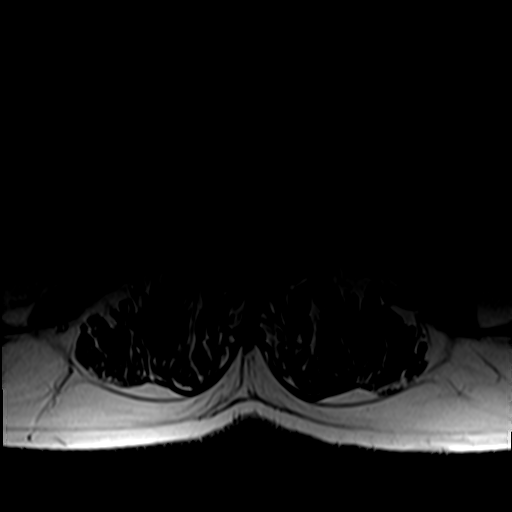
[im 37/44]
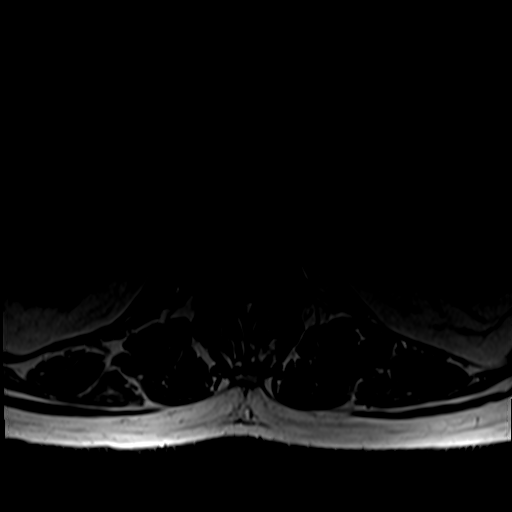

[26 of 48 positions shown; findings below may reference images not displayed]

FINDINGS: Segmentation: 5 non rib-bearing lumbar type vertebral bodies are
present. The lowest fully formed vertebral body is L5.

Alignment: Slight retrolisthesis is present at L1-2. Anterolisthesis
L4-5 measures 6 mm. Both levels are less severe than on the standing
radiographs. Mild leftward curvature is centered at L3-4.

Vertebrae: Chronic fatty endplate marrow changes surround Schmorl's
nodes at L1-2. There is mild edema about Schmorl's nodes at L3-4.
Marrow signal and vertebral body heights are otherwise normal.

Conus medullaris and cauda equina: Conus extends to the L1-2 level.
Conus and cauda equina appear normal.

Paraspinal and other soft tissues: Limited imaging the abdomen is
unremarkable. There is no significant adenopathy. No solid organ
lesions are present.

Disc levels:

T12-L1: Negative.

L1-2: Broad-based disc protrusion is associated with the
retrolisthesis. Mild facet hypertrophy is noted. Mild subarticular
narrowing is worse on the left. Moderate foraminal narrowing is
present bilaterally.

L2-3: A broad-based disc protrusion is present. Advanced facet
hypertrophy contributes to severe central canal stenosis with
crowding of the nerve roots fill. Moderate foraminal narrowing is
present bilaterally.

L3-4: Nerve roots are somewhat tortuous between L2-3 and L3-4. A
broad-based disc protrusion and advanced facet hypertrophy
contribute to severe central canal stenosis at L3-4 with
obliteration of CSF signal. Moderate foraminal narrowing is present
bilaterally.

L4-5: Advanced facet hypertrophy is present. Uncovering of a
broad-based disc protrusion results in severe central canal stenosis
no residual CSF signal remains. Moderate to severe foraminal
narrowing is present bilaterally.

L5-S1: A central disc protrusion contacts the S1 nerve roots
bilaterally, left greater than right. Moderate bilateral facet
hypertrophy is noted. Moderate left and mild right foraminal
narrowing is present.
IMPRESSION: 1. Severe central canal stenosis at L2-3, L3-4, and L4-5 with
obliteration of CSF signal and crowding of the nerve roots.
2. Moderate to severe foraminal narrowing bilaterally at L4-5.
3. Moderate foraminal narrowing bilaterally at L2-3 and L3-4.
4. Moderate left and mild right subarticular and foraminal narrowing
at L5-S1.
5. Mild subarticular and moderate foraminal narrowing bilaterally at
L1-2.

## 2024-01-09 ENCOUNTER — Ambulatory Visit: Admitting: Emergency Medicine

## 2024-01-09 ENCOUNTER — Encounter: Payer: Self-pay | Admitting: Emergency Medicine

## 2024-01-09 VITALS — BP 122/78 | HR 74 | Temp 98.6°F | Resp 16 | Ht 68.0 in | Wt 199.0 lb

## 2024-01-09 DIAGNOSIS — Z79899 Other long term (current) drug therapy: Secondary | ICD-10-CM

## 2024-01-09 DIAGNOSIS — J019 Acute sinusitis, unspecified: Secondary | ICD-10-CM

## 2024-01-09 DIAGNOSIS — R051 Acute cough: Secondary | ICD-10-CM | POA: Diagnosis not present

## 2024-01-09 LAB — POC COVID19 BINAXNOW: SARS Coronavirus 2 Ag: NEGATIVE

## 2024-01-09 LAB — POCT INFLUENZA A/B
Influenza A, POC: NEGATIVE
Influenza B, POC: NEGATIVE

## 2024-01-09 MED ORDER — PSEUDOEPH-BROMPHEN-DM 30-2-10 MG/5ML PO SYRP: 5.0000 mL | ORAL_SOLUTION | Freq: Three times a day (TID) | ORAL | 0 refills | Status: AC | PRN

## 2024-01-09 MED ORDER — AMOXICILLIN 875 MG PO TABS: 875.0000 mg | ORAL_TABLET | Freq: Two times a day (BID) | ORAL | 0 refills | Status: AC

## 2024-01-09 NOTE — Patient Instructions (Signed)
 Do not combine prescription syrup with robitussin but can take tylenol . Be careful as can cause some dizziness- change positions slowly, make sure to stay hydrated.

## 2024-01-09 NOTE — Progress Notes (Signed)
 "  Assessment & Plan:  1. Acute non-recurrent sinusitis, unspecified location (Primary) High risk patient, overall worsening despite supportive measures at home. Lower threshold to initiate abx. Rx as below. Med precautions reviewed. Saline rinses recommended.  - amoxicillin  (AMOXIL ) 875 MG tablet; Take 1 tablet (875 mg total) by mouth 2 (two) times daily. 1 po BID  Dispense: 20 tablet; Refill: 0 - brompheniramine-pseudoephedrine-DM 30-2-10 MG/5ML syrup; Take 5 mLs by mouth 3 (three) times daily as needed (for congestion and cough).  Dispense: 118 mL; Refill: 0  2. Acute cough Testing negative as below. Lungs clear. Cough from PND. Supportive measures reviewed.  - POCT Influenza A/B - POC COVID-19 BinaxNow  3. High risk medication use See above      Results for orders placed or performed in visit on 01/09/24  POCT Influenza A/B  Result Value Ref Range   Influenza A, POC Negative Negative   Influenza B, POC Negative Negative  POC COVID-19 BinaxNow  Result Value Ref Range   SARS Coronavirus 2 Ag Negative Negative    Patient Instructions  Do not combine prescription syrup with robitussin but can take tylenol . Be careful as can cause some dizziness- change positions slowly, make sure to stay hydrated.    Joshua Ruiz, MSPAS, PA-C   Subjective:  Nasal Congestion (Congestion, drainage and cough x 5 days. No fever or body aches. )   HPI: Joshua Ruiz is a 83 y.o. male presenting with URI sx Started 5 days ago Mostly head congestion with drainage cough and hoarse voie No fevers any time, nor CP or SOB Has tried acetaminophen , robitussin. Helps a little bit No known close contact who are ill On plaquenil  and MTX for RA  ROS: Negative unless specifically indicated above in HPI.   Relevant past medical history reviewed and updated as indicated.   Allergies and medications reviewed and updated.  Current Medications[1]  Allergies[2]    Objective:   Vitals:    01/09/24 0946  BP: 122/78  Pulse: 74  Temp: 98.6 F (37 C)  Resp: 16  Height: 5' 8 (1.727 m)  Weight: 199 lb (90.3 kg)  SpO2: 98%  BMI (Calculated): 30.26      Gen: appears fatigue, alert, INAD Ears: Right canal clear. Right TM serous effusion. Left canal clear. Left TM serous effusion.  Nose: normal and patent, no erythema, discharge or polyps, mucosal congestion, and mucosal erythema Mouth: Oral mucosa moist. Throat: cobblestoned  Neck: supple and no adenopathy Heart RRR Lungs: Respiratory effort: normal.  clear to auscultation, no wheezes, rales, or rhonchi Skin: Warm and dry without acute rash to exposed areas.           [1]  Current Outpatient Medications:    amoxicillin  (AMOXIL ) 875 MG tablet, Take 1 tablet (875 mg total) by mouth 2 (two) times daily. 1 po BID, Disp: 20 tablet, Rfl: 0   Azelastine  HCl 137 MCG/SPRAY SOLN, Place 1 spray into both nostrils 2 (two) times daily., Disp: , Rfl:    brompheniramine-pseudoephedrine-DM 30-2-10 MG/5ML syrup, Take 5 mLs by mouth 3 (three) times daily as needed (for congestion and cough)., Disp: 118 mL, Rfl: 0   cetirizine (ZYRTEC) 10 MG tablet, Take 10 mg by mouth at bedtime., Disp: , Rfl:    finasteride  (PROSCAR ) 5 MG tablet, Take 5 mg by mouth daily., Disp: , Rfl:    fluticasone (FLONASE) 50 MCG/ACT nasal spray, Place into both nostrils., Disp: , Rfl:    folic acid  (FOLVITE ) 1 MG tablet, Take 1  mg by mouth daily., Disp: , Rfl:    hydroxychloroquine  (PLAQUENIL ) 200 MG tablet, Take 200 mg by mouth 2 (two) times daily., Disp: , Rfl:    ipratropium (ATROVENT) 0.06 % nasal spray, Place 1 spray into both nostrils 2 (two) times daily., Disp: , Rfl:    loratadine (CLARITIN) 10 MG tablet, Take 10 mg by mouth in the morning., Disp: , Rfl:    methotrexate (RHEUMATREX) 2.5 MG tablet, Take 15 mg by mouth every Saturday. Caution:Chemotherapy. Protect from light., Disp: , Rfl:    mometasone  (ELOCON ) 0.1 % cream, APPLY TO AFFECTED AREA  TOPICALLY EVERY DAY, Disp: 15 g, Rfl: 1   Multiple Vitamin (MULTIVITAMIN WITH MINERALS) TABS tablet, Take 1 tablet by mouth daily., Disp: , Rfl:    Multiple Vitamins-Minerals (PRESERVISION AREDS 2) CAPS, Take 1 capsule by mouth in the morning and at bedtime., Disp: , Rfl:    omeprazole (PRILOSEC) 20 MG capsule, Take 20 mg by mouth in the morning and at bedtime. (Patient taking differently: Take 40 mg by mouth in the morning and at bedtime.), Disp: , Rfl:    sildenafil (VIAGRA) 100 MG tablet, Take 100 mg by mouth daily as needed., Disp: , Rfl:    tadalafil (CIALIS) 5 MG tablet, Take 5 mg by mouth daily., Disp: , Rfl:    amoxicillin  (AMOXIL ) 500 MG tablet, TAKE 4 TABLETS BY MOUTH 1 HOUR PRIOR TO DENTAL PROCEDURE, Disp: 4 tablet, Rfl: 2   ondansetron  (ZOFRAN ) 4 MG tablet, Take 1 tablet (4 mg total) by mouth every 8 (eight) hours as needed for nausea or vomiting. (Patient not taking: Reported on 01/09/2024), Disp: 40 tablet, Rfl: 0 [2] No Known Allergies  "

## 2024-01-30 ENCOUNTER — Other Ambulatory Visit: Payer: Self-pay

## 2024-01-30 ENCOUNTER — Telehealth: Payer: Self-pay | Admitting: Physician Assistant

## 2024-01-30 ENCOUNTER — Other Ambulatory Visit

## 2024-01-30 DIAGNOSIS — R195 Other fecal abnormalities: Secondary | ICD-10-CM

## 2024-01-30 DIAGNOSIS — R197 Diarrhea, unspecified: Secondary | ICD-10-CM

## 2024-01-30 NOTE — Telephone Encounter (Signed)
 Inbound call from patient stating he is still having complications with diarrhea. States previously he discuss having another colonoscopy in 11/13 office visit if symptoms persisted. Requesting a call back to discuss further. Please advise, thank you

## 2024-01-30 NOTE — Telephone Encounter (Signed)
 Patient seen last by Delon Failing, PA. Patient of Dr Legrand.  Patient reports continuation of the 2 to 3 diarrhea stools daily. Sometimes he has stomach cramps. Otherwise he feels well. Imodium holds me normal for a day.

## 2024-01-30 NOTE — Telephone Encounter (Signed)
 Patient is advised and agrees to this plan of care. No further questions at this time.

## 2024-01-31 ENCOUNTER — Other Ambulatory Visit

## 2024-01-31 DIAGNOSIS — R195 Other fecal abnormalities: Secondary | ICD-10-CM

## 2024-01-31 DIAGNOSIS — R197 Diarrhea, unspecified: Secondary | ICD-10-CM

## 2024-02-01 LAB — GI PROFILE, STOOL, PCR

## 2024-02-02 ENCOUNTER — Telehealth: Payer: Self-pay | Admitting: Physician Assistant

## 2024-02-02 LAB — CALPROTECTIN, FECAL: Calprotectin, Fecal: 172 ug/g — ABNORMAL HIGH (ref 0–120)

## 2024-02-02 NOTE — Telephone Encounter (Signed)
 Incoming call from pt regarding test results. Pt stated he has some questions and would like to speak w [name]. Please advise. Thank you.

## 2024-02-03 ENCOUNTER — Ambulatory Visit: Payer: Self-pay | Admitting: Physician Assistant

## 2024-02-20 ENCOUNTER — Encounter

## 2024-03-14 ENCOUNTER — Encounter: Admitting: Gastroenterology

## 2024-08-16 ENCOUNTER — Ambulatory Visit: Admitting: Orthopaedic Surgery

## 2024-11-05 ENCOUNTER — Ambulatory Visit
